# Patient Record
Sex: Female | Born: 1935 | Race: White | Hispanic: No | Smoking: Former smoker
Health system: Southern US, Community
[De-identification: ages and names within clinical notes are randomized; demographics above are authoritative.]

## PROBLEM LIST (undated history)

## (undated) DIAGNOSIS — H9190 Unspecified hearing loss, unspecified ear: Secondary | ICD-10-CM

## (undated) DIAGNOSIS — I739 Peripheral vascular disease, unspecified: Secondary | ICD-10-CM

## (undated) HISTORY — DX: Peripheral vascular disease, unspecified: I73.9

## (undated) HISTORY — DX: Unspecified hearing loss, unspecified ear: H91.90

## (undated) HISTORY — PX: ABDOMINAL HYSTERECTOMY: SHX81

---

## 1959-04-02 HISTORY — PX: CHOLECYSTECTOMY OPEN: SUR202

## 2001-04-01 HISTORY — PX: IR ERCP EXCH REM STENTS BIL PAN DUCT INC DIL EA STENT EXCH: IMG5453

## 2005-04-01 HISTORY — PX: CATARACT EXTRACTION, BILATERAL: SHX1313

## 2012-04-01 HISTORY — PX: ORIF FEMUR FRACTURE: SHX2119

## 2018-02-25 ENCOUNTER — Inpatient Hospital Stay: Payer: Medicare Other

## 2018-02-25 ENCOUNTER — Emergency Department: Payer: Medicare Other

## 2018-02-25 ENCOUNTER — Other Ambulatory Visit: Payer: Self-pay

## 2018-02-25 ENCOUNTER — Inpatient Hospital Stay
Admission: EM | Admit: 2018-02-25 | Discharge: 2018-03-01 | DRG: 871 | Disposition: A | Discharge status: Home Health | Payer: Medicare Other | Attending: Specialist | Admitting: Specialist

## 2018-02-25 DIAGNOSIS — I5031 Acute diastolic (congestive) heart failure: Secondary | ICD-10-CM | POA: Diagnosis present

## 2018-02-25 DIAGNOSIS — W458XXA Other foreign body or object entering through skin, initial encounter: Secondary | ICD-10-CM | POA: Diagnosis not present

## 2018-02-25 DIAGNOSIS — R0602 Shortness of breath: Secondary | ICD-10-CM | POA: Diagnosis not present

## 2018-02-25 DIAGNOSIS — Z95820 Peripheral vascular angioplasty status with implants and grafts: Secondary | ICD-10-CM | POA: Diagnosis not present

## 2018-02-25 DIAGNOSIS — R7881 Bacteremia: Secondary | ICD-10-CM | POA: Diagnosis not present

## 2018-02-25 DIAGNOSIS — B951 Streptococcus, group B, as the cause of diseases classified elsewhere: Secondary | ICD-10-CM | POA: Diagnosis not present

## 2018-02-25 DIAGNOSIS — A401 Sepsis due to streptococcus, group B: Principal | ICD-10-CM | POA: Diagnosis present

## 2018-02-25 DIAGNOSIS — Z9049 Acquired absence of other specified parts of digestive tract: Secondary | ICD-10-CM | POA: Diagnosis not present

## 2018-02-25 DIAGNOSIS — N179 Acute kidney failure, unspecified: Secondary | ICD-10-CM | POA: Diagnosis present

## 2018-02-25 DIAGNOSIS — A419 Sepsis, unspecified organism: Secondary | ICD-10-CM | POA: Diagnosis present

## 2018-02-25 DIAGNOSIS — G9340 Encephalopathy, unspecified: Secondary | ICD-10-CM | POA: Diagnosis present

## 2018-02-25 DIAGNOSIS — Z7902 Long term (current) use of antithrombotics/antiplatelets: Secondary | ICD-10-CM | POA: Diagnosis not present

## 2018-02-25 DIAGNOSIS — K831 Obstruction of bile duct: Secondary | ICD-10-CM

## 2018-02-25 DIAGNOSIS — I739 Peripheral vascular disease, unspecified: Secondary | ICD-10-CM | POA: Diagnosis not present

## 2018-02-25 DIAGNOSIS — Z87891 Personal history of nicotine dependence: Secondary | ICD-10-CM | POA: Diagnosis not present

## 2018-02-25 DIAGNOSIS — K838 Other specified diseases of biliary tract: Secondary | ICD-10-CM | POA: Diagnosis present

## 2018-02-25 DIAGNOSIS — S91104A Unspecified open wound of right lesser toe(s) without damage to nail, initial encounter: Secondary | ICD-10-CM | POA: Diagnosis not present

## 2018-02-25 DIAGNOSIS — Z7982 Long term (current) use of aspirin: Secondary | ICD-10-CM

## 2018-02-25 DIAGNOSIS — I248 Other forms of acute ischemic heart disease: Secondary | ICD-10-CM | POA: Diagnosis present

## 2018-02-25 DIAGNOSIS — R05 Cough: Secondary | ICD-10-CM

## 2018-02-25 DIAGNOSIS — E8770 Fluid overload, unspecified: Secondary | ICD-10-CM | POA: Diagnosis not present

## 2018-02-25 DIAGNOSIS — R059 Cough, unspecified: Secondary | ICD-10-CM

## 2018-02-25 LAB — CBC WITH DIFFERENTIAL/PLATELET
ABS IMMATURE GRANULOCYTES: 0.12 10*3/uL — AB (ref 0.00–0.07)
ABS IMMATURE GRANULOCYTES: 0.83 10*3/uL — AB (ref 0.00–0.07)
Basophils Absolute: 0.1 10*3/uL (ref 0.0–0.1)
Basophils Absolute: 0.1 10*3/uL (ref 0.0–0.1)
Basophils Relative: 0 %
Basophils Relative: 0 %
EOS ABS: 0.1 10*3/uL (ref 0.0–0.5)
EOS PCT: 0 %
Eosinophils Absolute: 0 10*3/uL (ref 0.0–0.5)
Eosinophils Relative: 0 %
HCT: 43.1 % (ref 36.0–46.0)
HEMATOCRIT: 38.4 % (ref 36.0–46.0)
HEMOGLOBIN: 14.3 g/dL (ref 12.0–15.0)
HEMOGLOBIN: 12.7 g/dL (ref 12.0–15.0)
IMMATURE GRANULOCYTES: 1 %
IMMATURE GRANULOCYTES: 3 %
LYMPHS ABS: 0.8 10*3/uL (ref 0.7–4.0)
LYMPHS PCT: 10 %
Lymphocytes Relative: 3 %
Lymphs Abs: 1.9 10*3/uL (ref 0.7–4.0)
MCH: 30.2 pg (ref 26.0–34.0)
MCH: 30.4 pg (ref 26.0–34.0)
MCHC: 33.2 g/dL (ref 30.0–36.0)
MCHC: 33.1 g/dL (ref 30.0–36.0)
MCV: 91.1 fL (ref 80.0–100.0)
MCV: 91.9 fL (ref 80.0–100.0)
MONO ABS: 0.5 10*3/uL (ref 0.1–1.0)
MONO ABS: 2 10*3/uL — AB (ref 0.1–1.0)
MONOS PCT: 8 %
Monocytes Relative: 3 %
NEUTROS ABS: 16.5 10*3/uL — AB (ref 1.7–7.7)
NEUTROS PCT: 86 %
Neutro Abs: 22.9 10*3/uL — ABNORMAL HIGH (ref 1.7–7.7)
Neutrophils Relative %: 86 %
Platelets: 350 10*3/uL (ref 150–400)
Platelets: 296 10*3/uL (ref 150–400)
RBC: 4.73 MIL/uL (ref 3.87–5.11)
RBC: 4.18 MIL/uL (ref 3.87–5.11)
RDW: 14 % (ref 11.5–15.5)
RDW: 14.2 % (ref 11.5–15.5)
Smear Review: NORMAL
WBC: 19.1 10*3/uL — AB (ref 4.0–10.5)
WBC: 26.7 10*3/uL — ABNORMAL HIGH (ref 4.0–10.5)
nRBC: 0 % (ref 0.0–0.2)
nRBC: 0 % (ref 0.0–0.2)

## 2018-02-25 LAB — BLOOD CULTURE ID PANEL (REFLEXED)
ACINETOBACTER BAUMANNII: NOT DETECTED
CANDIDA ALBICANS: NOT DETECTED
Candida glabrata: NOT DETECTED
Candida krusei: NOT DETECTED
Candida parapsilosis: NOT DETECTED
Candida tropicalis: NOT DETECTED
ENTEROBACTER CLOACAE COMPLEX: NOT DETECTED
ENTEROBACTERIACEAE SPECIES: NOT DETECTED
Enterococcus species: NOT DETECTED
Escherichia coli: NOT DETECTED
Haemophilus influenzae: NOT DETECTED
Klebsiella oxytoca: NOT DETECTED
Klebsiella pneumoniae: NOT DETECTED
Listeria monocytogenes: NOT DETECTED
NEISSERIA MENINGITIDIS: NOT DETECTED
PSEUDOMONAS AERUGINOSA: NOT DETECTED
Proteus species: NOT DETECTED
STREPTOCOCCUS AGALACTIAE: DETECTED — AB
STREPTOCOCCUS PNEUMONIAE: NOT DETECTED
STREPTOCOCCUS PYOGENES: NOT DETECTED
Serratia marcescens: NOT DETECTED
Staphylococcus aureus (BCID): NOT DETECTED
Staphylococcus species: NOT DETECTED
Streptococcus species: DETECTED — AB

## 2018-02-25 LAB — URINALYSIS, COMPLETE (UACMP) WITH MICROSCOPIC
BILIRUBIN URINE: NEGATIVE
Glucose, UA: NEGATIVE mg/dL
Ketones, ur: NEGATIVE mg/dL
LEUKOCYTES UA: NEGATIVE
NITRITE: NEGATIVE
PH: 5 (ref 5.0–8.0)
Protein, ur: 100 mg/dL — AB
SPECIFIC GRAVITY, URINE: 1.013 (ref 1.005–1.030)

## 2018-02-25 LAB — PROTIME-INR
INR: 1.02
INR: 1.12
PROTHROMBIN TIME: 13.3 s (ref 11.4–15.2)
Prothrombin Time: 14.3 seconds (ref 11.4–15.2)

## 2018-02-25 LAB — COMPREHENSIVE METABOLIC PANEL
ALK PHOS: 58 U/L (ref 38–126)
ALT: 15 U/L (ref 0–44)
ALT: 16 U/L (ref 0–44)
AST: 26 U/L (ref 15–41)
AST: 26 U/L (ref 15–41)
Albumin: 4.5 g/dL (ref 3.5–5.0)
Albumin: 3.5 g/dL (ref 3.5–5.0)
Alkaline Phosphatase: 72 U/L (ref 38–126)
Anion gap: 14 (ref 5–15)
Anion gap: 9 (ref 5–15)
BILIRUBIN TOTAL: 1.4 mg/dL — AB (ref 0.3–1.2)
BUN: 18 mg/dL (ref 8–23)
BUN: 20 mg/dL (ref 8–23)
CHLORIDE: 102 mmol/L (ref 98–111)
CO2: 22 mmol/L (ref 22–32)
CO2: 23 mmol/L (ref 22–32)
CREATININE: 1.09 mg/dL — AB (ref 0.44–1.00)
Calcium: 9.4 mg/dL (ref 8.9–10.3)
Calcium: 8.4 mg/dL — ABNORMAL LOW (ref 8.9–10.3)
Chloride: 104 mmol/L (ref 98–111)
Creatinine, Ser: 1 mg/dL (ref 0.44–1.00)
GFR calc Af Amer: 58 mL/min — ABNORMAL LOW (ref >60)
GFR calc Af Amer: 52 mL/min — ABNORMAL LOW (ref >60)
GFR, EST NON AFRICAN AMERICAN: 50 mL/min — AB (ref >60)
GFR, EST NON AFRICAN AMERICAN: 45 mL/min — AB (ref >60)
Glucose, Bld: 125 mg/dL — ABNORMAL HIGH (ref 70–99)
Glucose, Bld: 121 mg/dL — ABNORMAL HIGH (ref 70–99)
POTASSIUM: 3.9 mmol/L (ref 3.5–5.1)
Potassium: 3 mmol/L — ABNORMAL LOW (ref 3.5–5.1)
Sodium: 138 mmol/L (ref 135–145)
Sodium: 136 mmol/L (ref 135–145)
Total Bilirubin: 1.1 mg/dL (ref 0.3–1.2)
Total Protein: 8 g/dL (ref 6.5–8.1)
Total Protein: 6.4 g/dL — ABNORMAL LOW (ref 6.5–8.1)

## 2018-02-25 LAB — BLOOD GAS, VENOUS
ACID-BASE DEFICIT: 0.6 mmol/L (ref 0.0–2.0)
BICARBONATE: 25.4 mmol/L (ref 20.0–28.0)
O2 SAT: 38.9 %
PCO2 VEN: 46 mmHg (ref 44.0–60.0)
Patient temperature: 37
pH, Ven: 7.35 (ref 7.250–7.430)
pO2, Ven: <31 mmHg — CL (ref 32.0–45.0)

## 2018-02-25 LAB — GASTROINTESTINAL PANEL BY PCR, STOOL (REPLACES STOOL CULTURE)
Adenovirus F40/41: NOT DETECTED
Astrovirus: NOT DETECTED
CAMPYLOBACTER SPECIES: NOT DETECTED
Cryptosporidium: NOT DETECTED
Cyclospora cayetanensis: NOT DETECTED
ENTEROPATHOGENIC E COLI (EPEC): NOT DETECTED
Entamoeba histolytica: NOT DETECTED
Enteroaggregative E coli (EAEC): NOT DETECTED
Enterotoxigenic E coli (ETEC): NOT DETECTED
Giardia lamblia: NOT DETECTED
NOROVIRUS GI/GII: NOT DETECTED
PLESIMONAS SHIGELLOIDES: NOT DETECTED
Rotavirus A: NOT DETECTED
SAPOVIRUS (I, II, IV, AND V): NOT DETECTED
SHIGA LIKE TOXIN PRODUCING E COLI (STEC): NOT DETECTED
Salmonella species: NOT DETECTED
Shigella/Enteroinvasive E coli (EIEC): NOT DETECTED
Vibrio cholerae: NOT DETECTED
Vibrio species: NOT DETECTED
Yersinia enterocolitica: NOT DETECTED

## 2018-02-25 LAB — INFLUENZA PANEL BY PCR (TYPE A & B)
INFLAPCR: NEGATIVE
INFLBPCR: NEGATIVE

## 2018-02-25 LAB — CG4 I-STAT (LACTIC ACID)
Lactic Acid, Venous: 4.49 mmol/L (ref 0.5–1.9)
Lactic Acid, Venous: 2.91 mmol/L (ref 0.5–1.9)

## 2018-02-25 LAB — C DIFFICILE QUICK SCREEN W PCR REFLEX
C DIFFICILE (CDIFF) INTERP: NOT DETECTED
C DIFFICLE (CDIFF) ANTIGEN: NEGATIVE
C Diff toxin: NEGATIVE

## 2018-02-25 LAB — LACTIC ACID, PLASMA
Lactic Acid, Venous: 2.4 mmol/L (ref 0.5–1.9)
Lactic Acid, Venous: 2.4 mmol/L (ref 0.5–1.9)

## 2018-02-25 LAB — AMMONIA: AMMONIA: 11 umol/L (ref 9–35)

## 2018-02-25 LAB — BRAIN NATRIURETIC PEPTIDE: B Natriuretic Peptide: 60 pg/mL (ref 0.0–100.0)

## 2018-02-25 LAB — APTT: APTT: 42 s — AB (ref 24–36)

## 2018-02-25 LAB — PROCALCITONIN: Procalcitonin: 26.01 ng/mL

## 2018-02-25 LAB — TROPONIN I: Troponin I: <0.03 ng/mL (ref <0.03)

## 2018-02-25 MED ORDER — SODIUM CHLORIDE 0.9 % IV SOLN
2.0000 g | Freq: Two times a day (BID) | INTRAVENOUS | Status: DC
  Filled 2018-02-25: qty 2

## 2018-02-25 MED ORDER — VANCOMYCIN HCL IN DEXTROSE 1-5 GM/200ML-% IV SOLN
1000.0000 mg | INTRAVENOUS | Status: DC
  Administered 2018-02-26: 1000 mg via INTRAVENOUS
  Filled 2018-02-25 (×2): qty 200

## 2018-02-25 MED ORDER — SODIUM CHLORIDE 0.9 % IV BOLUS
500.0000 mL | Freq: Once | INTRAVENOUS | Status: AC
  Administered 2018-02-25: 500 mL via INTRAVENOUS

## 2018-02-25 MED ORDER — SODIUM CHLORIDE 0.9 % IV SOLN: INTRAVENOUS | Status: DC

## 2018-02-25 MED ORDER — ENOXAPARIN SODIUM 40 MG/0.4ML ~~LOC~~ SOLN
40.0000 mg | SUBCUTANEOUS | Status: DC
  Administered 2018-02-25 – 2018-02-28 (×4): 40 mg via SUBCUTANEOUS
  Filled 2018-02-25 (×4): qty 0.4

## 2018-02-25 MED ORDER — METRONIDAZOLE IN NACL 5-0.79 MG/ML-% IV SOLN
500.0000 mg | Freq: Three times a day (TID) | INTRAVENOUS | Status: DC
  Administered 2018-02-25 – 2018-02-26 (×3): 500 mg via INTRAVENOUS
  Filled 2018-02-25 (×5): qty 100

## 2018-02-25 MED ORDER — LORAZEPAM 2 MG/ML IJ SOLN
1.0000 mg | Freq: Once | INTRAMUSCULAR | Status: AC
  Administered 2018-02-25: 22:00:00 1 mg via INTRAVENOUS
  Filled 2018-02-25: qty 1

## 2018-02-25 MED ORDER — VANCOMYCIN HCL 10 G IV SOLR
1000.0000 mg | Freq: Once | INTRAVENOUS | Status: DC
  Filled 2018-02-25: qty 1000

## 2018-02-25 MED ORDER — ACETAMINOPHEN 325 MG PO TABS: 650.0000 mg | ORAL_TABLET | Freq: Four times a day (QID) | ORAL | Status: DC | PRN

## 2018-02-25 MED ORDER — SODIUM CHLORIDE 0.9 % IV BOLUS
1000.0000 mL | Freq: Once | INTRAVENOUS | Status: AC
  Administered 2018-02-25: 1000 mL via INTRAVENOUS

## 2018-02-25 MED ORDER — GADOBUTROL 1 MMOL/ML IV SOLN
8.5000 mL | Freq: Once | INTRAVENOUS | Status: AC | PRN
  Administered 2018-02-25: 8.5 mL via INTRAVENOUS

## 2018-02-25 MED ORDER — SODIUM CHLORIDE 0.9 % IV SOLN
2.0000 g | Freq: Once | INTRAVENOUS | Status: DC
  Filled 2018-02-25: qty 2

## 2018-02-25 MED ORDER — SODIUM CHLORIDE 0.9 % IV SOLN
INTRAVENOUS | Status: DC
  Administered 2018-02-25: 17:00:00 via INTRAVENOUS

## 2018-02-25 MED ORDER — ONDANSETRON HCL 4 MG PO TABS: 4.0000 mg | ORAL_TABLET | Freq: Four times a day (QID) | ORAL | Status: DC | PRN

## 2018-02-25 MED ORDER — ONDANSETRON HCL 4 MG/2ML IJ SOLN
4.0000 mg | Freq: Four times a day (QID) | INTRAMUSCULAR | Status: DC | PRN
  Administered 2018-02-28: 17:00:00 4 mg via INTRAVENOUS
  Filled 2018-02-25: qty 2

## 2018-02-25 MED ORDER — SODIUM CHLORIDE 0.9 % IV SOLN: 2.0000 g | Freq: Two times a day (BID) | INTRAVENOUS | Status: DC

## 2018-02-25 MED ORDER — VANCOMYCIN HCL IN DEXTROSE 1-5 GM/200ML-% IV SOLN
1000.0000 mg | Freq: Once | INTRAVENOUS | Status: AC
  Administered 2018-02-25: 1000 mg via INTRAVENOUS
  Filled 2018-02-25: qty 200

## 2018-02-25 MED ORDER — PIPERACILLIN-TAZOBACTAM 3.375 G IVPB 30 MIN
3.3750 g | Freq: Once | INTRAVENOUS | Status: AC
  Administered 2018-02-25: 3.375 g via INTRAVENOUS
  Filled 2018-02-25: qty 50

## 2018-02-25 MED ORDER — POTASSIUM CHLORIDE 20 MEQ PO PACK
40.0000 meq | PACK | Freq: Once | ORAL | Status: AC
  Administered 2018-02-25: 40 meq via ORAL
  Filled 2018-02-25: qty 2

## 2018-02-25 MED ORDER — ACETAMINOPHEN 650 MG RE SUPP: 650.0000 mg | Freq: Four times a day (QID) | RECTAL | Status: DC | PRN

## 2018-02-25 MED ORDER — TRAZODONE HCL 50 MG PO TABS
50.0000 mg | ORAL_TABLET | Freq: Every evening | ORAL | Status: DC | PRN
  Administered 2018-02-26 – 2018-02-28 (×2): 50 mg via ORAL
  Filled 2018-02-25 (×3): qty 1

## 2018-02-25 MED ORDER — VANCOMYCIN HCL IN DEXTROSE 1-5 GM/200ML-% IV SOLN
1000.0000 mg | Freq: Once | INTRAVENOUS | Status: DC
  Filled 2018-02-25: qty 200

## 2018-02-25 MED ORDER — POTASSIUM CHLORIDE IN NACL 20-0.9 MEQ/L-% IV SOLN
INTRAVENOUS | Status: DC
  Administered 2018-02-25 – 2018-02-27 (×5): via INTRAVENOUS
  Filled 2018-02-25 (×7): qty 1000

## 2018-02-25 NOTE — H&P (Signed)
[facility] [place] [facility]   PATIENT NAME: Kristen Dudley    MR#:  [id number]  DATE OF BIRTH:  [birth year]  DATE OF ADMISSION:  02/25/2018  PRIMARY CARE PHYSICIAN: Patient, No Pcp Per   REQUESTING/REFERRING PHYSICIAN:   CHIEF COMPLAINT:   Chief Complaint  Patient presents with  . Emesis  . Abdominal Pain    HISTORY OF PRESENT ILLNESS: Kristen Dudley  is a 84 y.o. female with a known history per below recently [place] the [place] to live with son, patient is a somewhat poor historian/difficult interview, per ED attending presenting with nausea/vomiting/diarrhea, generalized weakness, fatigue, going on for approximately 2 days, was brought to the emergency room by son-patient had refused being brought to emergency room previously, had also complaints of low back pain, noted acute sepsis on work-up, febrile, tachycardic, tachypneic, lactic acid 4.4, white count of 19,000, C. difficile was negative, urinalysis negative, CT abdomen noted for common bile duct dilatation/AAA 3.8 cm, GI panel pending, patient evaluated in the emergency room, family currently not available, patient able to answer some questions but does not give great detail/history, patient is now been admitted for acute sepsis, diarrhea, acute encephalopathy secondary to unknown etiology.  PAST MEDICAL HISTORY:   Unable to be obtained given poor historian  PAST SURGICAL HISTORY:  Past Surgical History:  Procedure Laterality Date  . IR ERCP EXCH REM STENTS BIL PAN DUCT INC DIL EA STENT EXCH     leg stents    SOCIAL HISTORY:  Social History   Tobacco Use  . Smoking status: Former Games developer  . Smokeless tobacco: Never Used  Substance Use Topics  . Alcohol use: Never    Frequency: Never    FAMILY HISTORY:  Unable to be obtained given poor historian  DRUG ALLERGIES:  Allergies  Allergen Reactions  . Iohexol     "makes me pass out"    REVIEW OF SYSTEMS: Poor  historian  CONSTITUTIONAL: + fever, fatigue, weakness.  EYES: No blurred or double vision.  EARS, NOSE, AND THROAT: No tinnitus or ear pain.  RESPIRATORY: No cough, shortness of breath, wheezing or hemoptysis.  CARDIOVASCULAR: No chest pain, orthopnea, edema.  GASTROINTESTINAL: No nausea, vomiting, diarrhea or abdominal pain.  GENITOURINARY: No dysuria, hematuria.  ENDOCRINE: No polyuria, nocturia,  HEMATOLOGY: No anemia, easy bruising or bleeding SKIN: No rash or lesion. MUSCULOSKELETAL: No joint pain or arthritis.   NEUROLOGIC: No tingling, numbness, weakness.  PSYCHIATRY: No anxiety or depression.   MEDICATIONS AT HOME:  Prior to Admission medications   Not on File      PHYSICAL EXAMINATION:   VITAL SIGNS: Blood pressure (!) 114/48, pulse (!) 111, temperature 98.8 F (37.1 C), temperature source Oral, resp. rate (!) 33, height 5\' 3"  (1.6 m), weight 87.5 kg, SpO2 96 %.  GENERAL:  84 y.o.-year-old patient lying in the bed with no acute distress.  Morbidly obese EYES: Pupils equal, round, reactive to light and accommodation. No scleral icterus. Extraocular muscles intact.  HEENT: Head atraumatic, normocephalic. Oropharynx and nasopharynx clear.  Dry mucous membranes NECK:  Supple, no jugular venous distention. No thyroid enlargement, no tenderness.  Poor skin turgor LUNGS: Normal breath sounds bilaterally, no wheezing, rales,rhonchi or crepitation. No use of accessory muscles of respiration.  CARDIOVASCULAR: S1, S2 normal. No murmurs, rubs, or gallops.  ABDOMEN: Soft, nontender, nondistended. Bowel sounds present. No organomegaly or mass.  EXTREMITIES: No pedal edema, cyanosis, or clubbing.  NEUROLOGIC: Cranial nerves II through XII are  intact. MAES no focal deficits  PSYCHIATRIC: The patient is lethargic, will open eyes and answer questions intermittently  Skin: No obvious rash, lesion, or ulcer.   LABORATORY PANEL:   CBC Recent Labs  Lab 02/25/18 1059 02/25/18 1552   WBC 19.1* 26.7*  HGB 14.3 12.7  HCT 43.1 38.4  PLT 350 296  MCV 91.1 91.9  MCH 30.2 30.4  MCHC 33.2 33.1  RDW 14.0 14.2  LYMPHSABS 1.9 0.8  MONOABS 0.5 2.0*  EOSABS 0.0 0.1  BASOSABS 0.1 0.1   ------------------------------------------------------------------------------------------------------------------  Chemistries  Recent Labs  Lab 02/25/18 1059 02/25/18 1552  NA 138 136  K 3.9 3.0*  CL 102 104  CO2 22 23  GLUCOSE 125* 121*  BUN 18 20  CREATININE 1.00 1.09*  CALCIUM 9.4 8.4*  AST 26 26  ALT 15 16  ALKPHOS 72 58  BILITOT 1.1 1.4*   ------------------------------------------------------------------------------------------------------------------ estimated creatinine clearance is 37.4 mL/min (A) (by C-G formula based on SCr of 1.09 mg/dL (H)). ------------------------------------------------------------------------------------------------------------------ No results for input(s): TSH, T4TOTAL, T3FREE, THYROIDAB in the last 72 hours.  Invalid input(s): FREET3   Coagulation profile Recent Labs  Lab 02/25/18 1059 02/25/18 1552  INR 1.02 1.12   ------------------------------------------------------------------------------------------------------------------- No results for input(s): DDIMER in the last 72 hours. -------------------------------------------------------------------------------------------------------------------  Cardiac Enzymes Recent Labs  Lab 02/25/18 1059  TROPONINI <0.03   ------------------------------------------------------------------------------------------------------------------ Invalid input(s): POCBNP  ---------------------------------------------------------------------------------------------------------------  Urinalysis    Component Value Date/Time   COLORURINE YELLOW (A) 02/25/2018 1059   APPEARANCEUR HAZY (A) 02/25/2018 1059   LABSPEC 1.013 02/25/2018 1059   PHURINE 5.0 02/25/2018 1059   GLUCOSEU NEGATIVE  02/25/2018 1059   HGBUR SMALL (A) 02/25/2018 1059   BILIRUBINUR NEGATIVE 02/25/2018 1059   KETONESUR NEGATIVE 02/25/2018 1059   PROTEINUR 100 (A) 02/25/2018 1059   NITRITE NEGATIVE 02/25/2018 1059   LEUKOCYTESUR NEGATIVE 02/25/2018 1059     RADIOLOGY: Ct Abdomen Pelvis Wo Contrast  Result Date: 02/25/2018 CLINICAL DATA:  Nausea. Left-sided pain and low back pain. Vomiting. Sepsis. EXAM: CT ABDOMEN AND PELVIS WITHOUT CONTRAST TECHNIQUE: Multidetector CT imaging of the abdomen and pelvis was performed following the standard protocol without IV contrast. COMPARISON:  None. FINDINGS: Lower chest: No acute abnormality. Hepatobiliary: The liver parenchyma appears normal except for a few calcified granulomas. There is dilatation of the common hepatic and common bile duct to a diameter of 16 mm. No discrete stone or mass. Pancreas: Unremarkable. No pancreatic ductal dilatation or surrounding inflammatory changes. Spleen: Numerous calcified granulomas in the spleen. Adrenals/Urinary Tract: Adrenal glands are unremarkable. Kidneys are normal, without renal calculi, focal lesion, or hydronephrosis. Bladder is unremarkable. Stomach/Bowel: Stomach is within normal limits. Appendix is not visualized. No evidence of bowel wall thickening, distention, or inflammatory changes. Multiple diverticula in the left side of the colon. No discrete diverticulitis. Vascular/Lymphatic: There is focal dilatation of the abdominal aorta to a maximum diameter of 3.8 cm. Extensive aortic atherosclerosis. Adenopathy. Reproductive: Status post hysterectomy. No adnexal masses. Other: No abdominal wall hernia or abnormality. No abdominopelvic ascites. Musculoskeletal: No acute or significant osseous findings. IMPRESSION: 1. Dilatation of the common hepatic and common bile duct, greater than would be expected after cholecystectomy. Surgical history in the medical record indicates prior bile duct stents. There is only slight intrahepatic  ductal dilatation. 2. Extensive aortic atherosclerosis with aneurysmal dilatation of the abdominal aorta to a diameter of 3.8 cm. Recommend followup by ultrasound in 2 years. This recommendation follows ACR consensus guidelines: White Paper of the ACR Incidental Findings Committee  II on Vascular Findings. J Am Coll Radiol 2013; 10:789-794. 3. No acute abnormalities. Electronically Signed   By: Francene Boyers M.D.   On: 02/25/2018 12:38   Dg Chest 2 View  Result Date: 02/25/2018 CLINICAL DATA:  Nausea.  Left-sided pain and low back pain. EXAM: CHEST - 2 VIEW COMPARISON:  None. FINDINGS: Heart size and pulmonary vascularity are normal. Aortic atherosclerosis. No infiltrates or effusions. No acute bone abnormality. Calcified lymph node in the inferior aspect of the right hilum. Calcified granuloma at the right lung base. Calcified lymph node in the azygos region. IMPRESSION: No acute abnormalities. Aortic Atherosclerosis (ICD10-I70.0). Electronically Signed   By: Francene Boyers M.D.   On: 02/25/2018 12:28   Ct Head Wo Contrast  Result Date: 02/25/2018 CLINICAL DATA:  84 y/o  F; altered mental status. EXAM: CT HEAD WITHOUT CONTRAST TECHNIQUE: Contiguous axial images were obtained from the base of the skull through the vertex without intravenous contrast. COMPARISON:  None. FINDINGS: Brain: No evidence of acute infarction, hemorrhage, hydrocephalus, extra-axial collection or mass lesion/mass effect. 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 Nonspecific white matter hypodensities are compatible with mild chronic microvascular ischemic changes for age and there is mild volume loss of the brain. Vascular: Calcific atherosclerosis of the carotid siphons. No hyperdense vessel identified. Skull: Normal. Negative for fracture or focal lesion. Sinuses/Orbits: No acute finding. Other: None. IMPRESSION: 1. No acute intracranial abnormality identified. 2. Mild chronic  microvascular ischemic changes and mild volume loss of the brain for age. 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 Electronically Signed   By: Mitzi Hansen M.D.   On: 02/25/2018 15:04   Dg Chest Port 1 View  Result Date: 02/25/2018 CLINICAL DATA:  Sepsis. EXAM: PORTABLE CHEST 1 VIEW COMPARISON:  Radiographs of February 25, 2018. FINDINGS: Stable cardiomediastinal silhouette. Atherosclerosis of thoracic aorta is noted. No pneumothorax pleural effusion is noted. Left lung is unremarkable. Stable calcified granuloma seen in right lower lobe. Mildly increased right basilar opacity is noted which may represent atelectasis or infiltrate. Bony thorax is unremarkable. IMPRESSION: Mildly increased right basilar densities are noted which may represent atelectasis or possibly infiltrate. Aortic Atherosclerosis (ICD10-I70.0). Electronically Signed   By: Lupita Raider, M.D.   On: 02/25/2018 16:10    EKG: Orders placed or performed during the hospital encounter of 02/25/18  . ED EKG  . ED EKG  . EKG 12-Lead  . EKG 12-Lead    IMPRESSION AND PLAN: *Acute sepsis secondary to unknown etiology *Acute nausea/vomiting/diarrhea *Acute encephalopathy *Acute abnormal CT of the abdomen noted for common bile duct dilatation  Admit to regular nursing for bed on our sepsis protocol, empiric cefepime/Flagyl/vancomycin, follow-up on cultures, IV fluids for rehydration, follow-up on GI panel, Lactinex 3 times daily, aspiration/fall/skin care precautions while in house, check urine drug screen, check RPR, check MRCP given common bile duct dilatation, neurochecks per routine, continue close medical monitoring   All the records are reviewed and case discussed with ED provider. Management plans discussed with the patient, family and they are in agreement.  CODE STATUS:full    Code Status Orders  (From admission, onward)         Start     Ordered   02/25/18 1547   Full code  Continuous     02/25/18 1546        Code Status History    This patient has a current code status but no historical code  status.    Advance Directive Documentation     Most Recent Value  Type of Advance Directive  Healthcare Power of Attorney  Pre-existing out of facility DNR order (yellow form or pink MOST form)  -  "MOST" Form in Place?  -       TOTAL TIME TAKING CARE OF THIS PATIENT: 45 minutes.    Evelena AsaMontell D Salary M.D on 02/25/2018   Between 7am to 6pm - Pager - [phone]  After 6pm go to [email or web address] - password [email or web address]  [facility]  Office  [phone]  CC: Primary care physician; Patient, No Pcp Per   Note: This dictation was prepared with Dragon dictation along with smaller phrase technology. Any transcriptional errors that result from this process are unintentional.

## 2018-02-25 NOTE — Plan of Care (Signed)
Pt admitted from the ED.  Afebrile. Soft BP at 99/48. Now on RA with O2 sats in the mid 90's. ABX and IVF infusing. Pt a&Ox3, easily reoriented.  Poor historian, forgetful.  C/o mild L back pain. Denies nausea.

## 2018-02-25 NOTE — ED Triage Notes (Signed)
Pt nauseas for approx 1 hour w/ complaint of left side and lower back pain. Sudden onset. Pt vomit x1 since being in room. Usually continent, had bowel mvmt

## 2018-02-25 NOTE — ED Notes (Signed)
ED TO INPATIENT HANDOFF REPORT  Name/Age/Gender Kristen Dudley 84 y.o. female  Code Status Advance Directive Documentation     Most Recent Value  Type of Advance Directive  Healthcare Power of Attorney  Pre-existing out of facility DNR order (yellow form or pink MOST form)  -  "MOST" Form in Place?  -      Home/SNF/Other Home  Chief Complaint shaking chills lt hip pain  Level of Care/Admitting Diagnosis ED Disposition    ED Disposition Condition Comment   Admit  Hospital Area: [facility] [[id number]]  Level of Care: Med-Surg [16]  Diagnosis: Sepsis [facility]) [[id number]]  Admitting Physician: Bertrum Sol [[id number]]  Attending Physician: Bertrum Sol [[id number]]  Estimated length of stay: past midnight tomorrow  Certification:: I certify this patient will need inpatient services for at least 2 midnights  PT Class (Do Not Modify): Inpatient [101]  PT Acc Code (Do Not Modify): Private [1]       Medical History History reviewed. No pertinent past medical history.  Allergies Allergies  Allergen Reactions  . Iohexol     "makes me pass out"    IV Location/Drains/Wounds Patient Lines/Drains/Airways Status   Active Line/Drains/Airways    Name:   Placement date:   Placement time:   Site:   Days:   Peripheral IV 02/25/18 Left Forearm   02/25/18    1057    Forearm   less than 1   Peripheral IV 02/25/18 Right Forearm   02/25/18    1057    Forearm   less than 1          Labs/Imaging Results for orders placed or performed during the hospital encounter of 02/25/18 (from the past 48 hour(s))  Comprehensive metabolic panel     Status: Abnormal   Collection Time: 02/25/18 10:59 AM  Result Value Ref Range   Sodium 138 135 - 145 mmol/L   Potassium 3.9 3.5 - 5.1 mmol/L   Chloride 102 98 - 111 mmol/L   CO2 22 22 - 32 mmol/L   Glucose, Bld 125 (H) 70 - 99 mg/dL   BUN 18 8 - 23 mg/dL   Creatinine, Ser 9.56 0.44 - 1.00 mg/dL   Calcium 9.4 8.9 - 21.3  mg/dL   Total Protein 8.0 6.5 - 8.1 g/dL   Albumin 4.5 3.5 - 5.0 g/dL   AST 26 15 - 41 U/L   ALT 15 0 - 44 U/L   Alkaline Phosphatase 72 38 - 126 U/L   Total Bilirubin 1.1 0.3 - 1.2 mg/dL   GFR calc non Af Amer 50 (L) >60 mL/min   GFR calc Af Amer 58 (L) >60 mL/min   Anion gap 14 5 - 15    Comment: Performed at [facility], [address] [place], [place] [address]  CBC with Differential     Status: Abnormal   Collection Time: 02/25/18 10:59 AM  Result Value Ref Range   WBC 19.1 (H) 4.0 - 10.5 K/uL   RBC 4.73 3.87 - 5.11 MIL/uL   Hemoglobin 14.3 12.0 - 15.0 g/dL   HCT 84.6 96.2 - 95.2 %   MCV 91.1 80.0 - 100.0 fL   MCH 30.2 26.0 - 34.0 pg   MCHC 33.2 30.0 - 36.0 g/dL   RDW 84.1 32.4 - 40.1 %   Platelets 350 150 - 400 K/uL   nRBC 0.0 0.0 - 0.2 %   Neutrophils Relative % 86 %   Neutro Abs 16.5 (H)  1.7 - 7.7 K/uL   Lymphocytes Relative 10 %   Lymphs Abs 1.9 0.7 - 4.0 K/uL   Monocytes Relative 3 %   Monocytes Absolute 0.5 0.1 - 1.0 K/uL   Eosinophils Relative 0 %   Eosinophils Absolute 0.0 0.0 - 0.5 K/uL   Basophils Relative 0 %   Basophils Absolute 0.1 0.0 - 0.1 K/uL   Immature Granulocytes 1 %   Abs Immature Granulocytes 0.12 (H) 0.00 - 0.07 K/uL    Comment: Performed at [facility], [address] [place], [place] [address]  Protime-INR     Status: None   Collection Time: 02/25/18 10:59 AM  Result Value Ref Range   Prothrombin Time 13.3 11.4 - 15.2 seconds   INR 1.02     Comment: Performed at [facility], [address] [place], [place] [address]  Urinalysis, Complete w Microscopic     Status: Abnormal   Collection Time: 02/25/18 10:59 AM  Result Value Ref Range   Color, Urine YELLOW (A) YELLOW   APPearance HAZY (A) CLEAR   Specific Gravity, Urine 1.013 1.005 - 1.030   pH 5.0 5.0 - 8.0   Glucose, UA NEGATIVE NEGATIVE mg/dL   Hgb urine dipstick SMALL (A) NEGATIVE   Bilirubin Urine NEGATIVE NEGATIVE   Ketones, ur NEGATIVE NEGATIVE  mg/dL   Protein, ur 829 (A) NEGATIVE mg/dL   Nitrite NEGATIVE NEGATIVE   Leukocytes, UA NEGATIVE NEGATIVE   RBC / HPF 6-10 0 - 5 RBC/hpf   WBC, UA 11-20 0 - 5 WBC/hpf   Bacteria, UA RARE (A) NONE SEEN   Squamous Epithelial / LPF 0-5 0 - 5   Mucus PRESENT    Amorphous Crystal PRESENT     Comment: Performed at [facility], [address], [place], [place] [address]  Troponin I - Add-On to previous collection     Status: None   Collection Time: 02/25/18 10:59 AM  Result Value Ref Range   Troponin I <0.03 <0.03 ng/mL    Comment: Performed at [facility], [address] [place], [place] [address]  Brain natriuretic peptide     Status: None   Collection Time: 02/25/18 10:59 AM  Result Value Ref Range   B Natriuretic Peptide 60.0 0.0 - 100.0 pg/mL    Comment: Performed at [facility], [address] [place], [place] [address]  C difficile quick scan w PCR reflex     Status: None   Collection Time: 02/25/18 11:00 AM  Result Value Ref Range   C Diff antigen NEGATIVE NEGATIVE   C Diff toxin NEGATIVE NEGATIVE   C Diff interpretation No C. difficile detected.     Comment: Performed at [facility], [address] [place], [place] [address]  CG4 I-STAT (Lactic acid)     Status: Abnormal   Collection Time: 02/25/18 11:04 AM  Result Value Ref Range   Lactic Acid, Venous 4.49 (HH) 0.5 - 1.9 mmol/L   Comment NOTIFIED PHYSICIAN   Influenza panel by PCR (type A & B)     Status: None   Collection Time: 02/25/18  1:08 PM  Result Value Ref Range   Influenza A By PCR NEGATIVE NEGATIVE   Influenza B By PCR NEGATIVE NEGATIVE    Comment: (NOTE) The Xpert Xpress Flu assay is intended as an aid in the diagnosis of  influenza and should not be used as a sole basis for treatment.  This  assay is FDA approved for nasopharyngeal swab  specimens only. Nasal  washings and aspirates are unacceptable for Xpert Xpress Flu testing. Performed at [facility],  [address] [place], [place] [address]   Blood gas, venous     Status: Abnormal   Collection Time: 02/25/18  1:08 PM  Result Value Ref Range   pH, Ven 7.35 7.250 - 7.430   pCO2, Ven 46 44.0 - 60.0 mmHg   pO2, Ven <31.0 (LL) 32.0 - 45.0 mmHg    Comment: VENOUS   Bicarbonate 25.4 20.0 - 28.0 mmol/L   Acid-base deficit 0.6 0.0 - 2.0 mmol/L   O2 Saturation 38.9 %   Patient temperature 37.0    Collection site VEIN    Sample type VENOUS     Comment: Performed at [facility], [address] [place], [place] [address]  CG4 I-STAT (Lactic acid)     Status: Abnormal   Collection Time: 02/25/18  2:44 PM  Result Value Ref Range   Lactic Acid, Venous 2.91 (HH) 0.5 - 1.9 mmol/L   Comment NOTIFIED PHYSICIAN    Ct Abdomen Pelvis Wo Contrast  Result Date: 02/25/2018 CLINICAL DATA:  Nausea. Left-sided pain and low back pain. Vomiting. Sepsis. EXAM: CT ABDOMEN AND PELVIS WITHOUT CONTRAST TECHNIQUE: Multidetector CT imaging of the abdomen and pelvis was performed following the standard protocol without IV contrast. COMPARISON:  None. FINDINGS: Lower chest: No acute abnormality. Hepatobiliary: The liver parenchyma appears normal except for a few calcified granulomas. There is dilatation of the common hepatic and common bile duct to a diameter of 16 mm. No discrete stone or mass. Pancreas: Unremarkable. No pancreatic ductal dilatation or surrounding inflammatory changes. Spleen: Numerous calcified granulomas in the spleen. Adrenals/Urinary Tract: Adrenal glands are unremarkable. Kidneys are normal, without renal calculi, focal lesion, or hydronephrosis. Bladder is unremarkable. Stomach/Bowel: Stomach is within normal limits. Appendix is not visualized. No evidence of bowel wall thickening, distention, or inflammatory changes. Multiple diverticula in the left side of the colon. No discrete diverticulitis. Vascular/Lymphatic: There is focal dilatation of the abdominal aorta to a maximum diameter  of 3.8 cm. Extensive aortic atherosclerosis. Adenopathy. Reproductive: Status post hysterectomy. No adnexal masses. Other: No abdominal wall hernia or abnormality. No abdominopelvic ascites. Musculoskeletal: No acute or significant osseous findings. IMPRESSION: 1. Dilatation of the common hepatic and common bile duct, greater than would be expected after cholecystectomy. Surgical history in the medical record indicates prior bile duct stents. There is only slight intrahepatic ductal dilatation. 2. Extensive aortic atherosclerosis with aneurysmal dilatation of the abdominal aorta to a diameter of 3.8 cm. Recommend followup by ultrasound in 2 years. This recommendation follows ACR consensus guidelines: White Paper of the ACR Incidental Findings Committee II on Vascular Findings. J Am Coll Radiol 2013; 10:789-794. 3. No acute abnormalities. Electronically Signed   By: Francene BoyersJames  Maxwell M.D.   On: 02/25/2018 12:38   Dg Chest 2 View  Result Date: 02/25/2018 CLINICAL DATA:  Nausea.  Left-sided pain and low back pain. EXAM: CHEST - 2 VIEW COMPARISON:  None. FINDINGS: Heart size and pulmonary vascularity are normal. Aortic atherosclerosis. No infiltrates or effusions. No acute bone abnormality. Calcified lymph node in the inferior aspect of the right hilum. Calcified granuloma at the right lung base. Calcified lymph node in the azygos region. IMPRESSION: No acute abnormalities. Aortic Atherosclerosis (ICD10-I70.0). Electronically Signed   By: Francene BoyersJames  Maxwell M.D.   On: 02/25/2018 12:28   Ct Head Wo Contrast  Result Date: 02/25/2018 CLINICAL DATA:  84 y/o  F; altered mental status.  EXAM: CT HEAD WITHOUT CONTRAST TECHNIQUE: Contiguous axial images were obtained from the base of the skull through the vertex without intravenous contrast. COMPARISON:  None. FINDINGS: Brain: No evidence of acute infarction, hemorrhage, hydrocephalus, extra-axial collection or mass lesion/mass effect. Very small chronic infarctions within  the right cerebellar hemisphere and bilateral thalami. Nonspecific white matter hypodensities are compatible with mild chronic microvascular ischemic changes for age and there is mild volume loss of the brain. Vascular: Calcific atherosclerosis of the carotid siphons. No hyperdense vessel identified. Skull: Normal. Negative for fracture or focal lesion. Sinuses/Orbits: No acute finding. Other: None. IMPRESSION: 1. No acute intracranial abnormality identified. 2. Mild chronic microvascular ischemic changes and mild volume loss of the brain for age. 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 Electronically Signed   By: Mitzi Hansen M.D.   On: 02/25/2018 15:04    Pending Labs Unresulted Labs (From admission, onward)    Start     Ordered   02/28/18 1230  Vancomycin, trough  Once-Timed,   STAT     02/25/18 1442   02/25/18 1429  Ammonia  Once,   STAT     02/25/18 1428   02/25/18 1429  RPR  Once,   STAT     02/25/18 1428   02/25/18 1428  Urine drugs of abuse scrn w alc, routine (Ref Lab)  Once,   STAT     02/25/18 1427   02/25/18 1116  Urine culture  Once,   STAT     02/25/18 1116   02/25/18 1114  Gastrointestinal Panel by PCR , Stool  (Gastrointestinal Panel by PCR, Stool)  Once,   STAT     02/25/18 1113   02/25/18 1053  Culture, blood (Routine x 2)  BLOOD CULTURE X 2,   STAT     02/25/18 1053   Signed and Held  CBC  (enoxaparin (LOVENOX)    CrCl >/= 30 ml/min)  Once,   R    Comments:  Baseline for enoxaparin therapy IF NOT ALREADY DRAWN.  Notify MD if PLT < 100 K.    Signed and Held   Signed and Held  Creatinine, serum  (enoxaparin (LOVENOX)    CrCl >/= 30 ml/min)  Once,   R    Comments:  Baseline for enoxaparin therapy IF NOT ALREADY DRAWN.    Signed and Held   Signed and Held  Creatinine, serum  (enoxaparin (LOVENOX)    CrCl >/= 30 ml/min)  Weekly,   R    Comments:  while on enoxaparin therapy    Signed and Held   Signed and Held  Basic  metabolic panel  Tomorrow morning,   R     Signed and Held   Signed and Held  CBC  Tomorrow morning,   R     Signed and Held   Signed and Held  Ammonia  Once,   R     Signed and Held   Signed and Held  RPR  Once,   R     Signed and Held   Signed and Held  Culture, blood (x 2)  BLOOD CULTURE X 2,   STAT    Comments:  INITIATE ANTIBIOTICS WITHIN 1 HOUR AFTER BLOOD CULTURES DRAWN.  If unable to obtain blood cultures, call MD immediately regarding antibiotic instructions.    Signed and Held   Signed and Held  CBC with Differential  ONCE - STAT,   R     Signed and  Held   Signed and Held  Comprehensive metabolic panel  ONCE - STAT,   R     Signed and Held   Signed and Held  Lactic acid, plasma  STAT Now then every 3 hours,   STAT     Signed and Held   Signed and Held  Procalcitonin  ONCE - STAT,   R     Signed and Held   Signed and Held  Protime-INR  ONCE - STAT,   R     Signed and Held   Signed and Held  APTT  ONCE - STAT,   R     Signed and Held          Vitals/Pain Today's Vitals   02/25/18 1400 02/25/18 1415 02/25/18 1430 02/25/18 1506  BP: 139/66 115/60 119/81   Pulse: (!) 116 (!) 114 95   Resp: (!) 26 (!) 29 (!) 33   Temp:      TempSrc:      SpO2: 99% 100% 95%   Weight:      Height:      PainSc:    0-No pain    Isolation Precautions Enteric precautions (UV disinfection)  Medications Medications  sodium chloride 0.9 % bolus 500 mL (500 mLs Intravenous New Bag/Given 02/25/18 1429)  vancomycin (VANCOCIN) IVPB 1000 mg/200 mL premix (has no administration in time range)  ceFEPIme (MAXIPIME) 2 g in sodium chloride 0.9 % 100 mL IVPB (has no administration in time range)  sodium chloride 0.9 % bolus 1,000 mL (0 mLs Intravenous Stopped 02/25/18 1336)  sodium chloride 0.9 % bolus 1,000 mL (0 mLs Intravenous Stopped 02/25/18 1347)  piperacillin-tazobactam (ZOSYN) IVPB 3.375 g (0 g Intravenous Stopped 02/25/18 1305)  vancomycin (VANCOCIN) IVPB 1000 mg/200 mL premix (0 mg  Intravenous Stopped 02/25/18 1343)    Mobility non-ambulatory Report called to [facility] RN on 1C, pt going to room 104

## 2018-02-25 NOTE — ED Provider Notes (Addendum)
[facility] Emergency Department Provider Note  ____________________________________________   I have reviewed the triage vital signs and the nursing notes. Where available I have reviewed prior notes and, if possible and indicated, outside hospital notes.    HISTORY  Chief Complaint Emesis and Abdominal Pain    HPI Kristen Dudley is a 84 y.o. female with a history of stents in her legs and no other known medical history.  She presents with her son.  She is from [place].  She is with him now.  He does not know very much about her history he states.  She herself will not answer any questions although she can tell me the day and year, she does not answer many questions about her health.  Apparently used to get her care from [facility], but I cannot find any records online about her.  Therefore, it is a very limited history.  According to son she has these episodes where her heart goes fast and she gets shivery and feels bad.  This happens every once in a while if she eats the wrong thing he states.  Patient herself states that she is having a little bit of abdominal pain and she wants something to drink.  She is tried to vomit but not vomited, she has had diarrhea on the way in.  She was fine until around 930 this morning when all of this started.  Has not had any other recent illness.  Denies any fall.  No melena or bright red blood per rectum.  Level 5 chart caveat; no further history available due to patient status. Her son and she both state that there are no allergies.  History reviewed. No pertinent past medical history.  There are no active problems to display for this patient.   Past Surgical History:  Procedure Laterality Date  . IR ERCP EXCH REM STENTS BIL PAN DUCT INC DIL EA STENT EXCH     leg stents    Prior to Admission medications   Not on File    Allergies Patient has no known allergies.  No family history on  file.  Social History Social History   Tobacco Use  . Smoking status: Former Games developer  . Smokeless tobacco: Never Used  Substance Use Topics  . Alcohol use: Never    Frequency: Never  . Drug use: Never    Review of Systems Constitutional: No fever/chills Eyes: No visual changes. ENT: No sore throat. No stiff neck no neck pain Cardiovascular: Denies chest pain. Respiratory: Denies shortness of breath. Gastrointestinal:   no vomiting.  + diarrhea.  No constipation. Genitourinary: Negative for dysuria. Musculoskeletal: Negative lower extremity swelling Skin: Negative for rash. Neurological: Negative for severe headaches, focal weakness or numbness.   ____________________________________________   PHYSICAL EXAM:  VITAL SIGNS: ED Triage Vitals  Enc Vitals Group     BP 02/25/18 1056 (!) 159/69     Pulse Rate 02/25/18 1056 (!) 135     Resp 02/25/18 1056 (!) 23     Temp 02/25/18 1047 (!) 100.7 F (38.2 C)     Temp Source 02/25/18 1047 Oral     SpO2 02/25/18 1056 91 %     Weight 02/25/18 1048 193 lb (87.5 kg)     Height 02/25/18 1048 5\' 3"  (1.6 m)     Head Circumference --      Peak Flow --      Pain Score 02/25/18 1047 5     Pain  Loc --      Pain Edu? --      Excl. in GC? --     Constitutional: And is somewhat somnolent but looking around requesting water; answers questions with a delay, will tell me that is 2019 that she is in the hospital and her name. Eyes: Conjunctivae are normal Head: Atraumatic HEENT: No congestion/rhinnorhea. Mucous membranes are somewhat dry.  Oropharynx non-erythematous Neck:   Nontender with no meningismus, no masses, no stridor Cardiovascular: Tachycardic rate 130 regular rhythm. Grossly normal heart sounds.  Good peripheral circulation. Respiratory: Normal respiratory effort.  No retractions.  Somewhat diminished in the bases poor respiratory effort for me Abdominal: Soft and diffuse mild tenderness nonsurgical. No distention. No guarding  no rebound Back:  There is no focal tenderness or step off.  there is no midline tenderness there are no lesions noted. there is no CVA tenderness Musculoskeletal: No lower extremity tenderness, no upper extremity tenderness. No joint effusions, no DVT signs strong distal pulses no edema Neurologic:  Normal speech and language. No gross focal neurologic deficits are appreciated.  Skin:  Skin is warm, dry and intact. No rash noted.   ____________________________________________   LABS (all labs ordered are listed, but only abnormal results are displayed)  Labs Reviewed  CBC WITH DIFFERENTIAL/PLATELET - Abnormal; Notable for the following components:      Result Value   WBC 19.1 (*)    Neutro Abs 16.5 (*)    Abs Immature Granulocytes 0.12 (*)    All other components within normal limits  CG4 I-STAT (LACTIC ACID) - Abnormal; Notable for the following components:   Lactic Acid, Venous 4.49 (*)    All other components within normal limits  CULTURE, BLOOD (ROUTINE X 2)  CULTURE, BLOOD (ROUTINE X 2)  GASTROINTESTINAL PANEL BY PCR, STOOL (REPLACES STOOL CULTURE)  C DIFFICILE QUICK SCREEN W PCR REFLEX  URINE CULTURE  COMPREHENSIVE METABOLIC PANEL  PROTIME-INR  URINALYSIS, COMPLETE (UACMP) WITH MICROSCOPIC  INFLUENZA PANEL BY PCR (TYPE A & B)  BLOOD GAS, VENOUS  TROPONIN I  BRAIN NATRIURETIC PEPTIDE  I-STAT CG4 LACTIC ACID, ED  I-STAT CG4 LACTIC ACID, ED    Pertinent labs  results that were available during my care of the patient were reviewed by me and considered in my medical decision making (see chart for details). ____________________________________________  EKG  I personally interpreted any EKGs ordered by me or triage Sinus tach rate 122, normal axis no acute ST elevation nonspecific ST changes, lateral ST depression possibly rate related, ____________________________________________  RADIOLOGY  Pertinent labs & imaging results that were available during my care of the  patient were reviewed by me and considered in my medical decision making (see chart for details). If possible, patient and/or family made aware of any abnormal findings.  No results found. ____________________________________________    PROCEDURES  Procedure(s) performed: None  Procedures  Critical Care performed: CRITICAL CARE Performed by: Jeanmarie Plant   Total critical care time: 45 minutes  Critical care time was exclusive of separately billable procedures and treating other patients.  Critical care was necessary to treat or prevent imminent or life-threatening deterioration.  Critical care was time spent personally by me on the following activities: development of treatment plan with patient and/or surrogate as well as nursing, discussions with consultants, evaluation of patient's response to treatment, examination of patient, obtaining history from patient or surrogate, ordering and performing treatments and interventions, ordering and review of laboratory studies, ordering and review of radiographic studies,  pulse oximetry and re-evaluation of patient's condition.   ____________________________________________   INITIAL IMPRESSION / ASSESSMENT AND PLAN / ED COURSE  Pertinent labs & imaging results that were available during my care of the patient were reviewed by me and considered in my medical decision making (see chart for details).  Patient here with very limited prior history presents with fever, tachycardia, elevated lactic and white cell count.  She does meet here yet for sepsis I will start on 2 L of fluid, give broad-spectrum antibiotics and we will look for source.  Abdomen is nonsurgical but there is tenderness involved, she does complain of right-sided tenderness we will obtain CT scan, oxygen saturation when I am in the room is around 91-92 we have put her on oxygen will get a chest x-ray, will check a VBG, very limited history, we really do not know much about  what the patient is on her should be on her what her heart failure status is etc.  I will send cardiac enzymes, we will obtain EKG, I will send stool sample for bio fire and C. difficile, and we will continue to aggressively work-up for the patient.  She will require admission.  Have also requested via fax records from her prior hospital so he can get some good data as her son is not medical and seems to have somewhat limited knowledge of her history.  ----------------------------------------- 12:21 PM on 02/25/2018 -----------------------------------------  We have made attempts to call pharmacies in her hometown and here and we are unable to find a med list.  [place]  Patient did tell CT scan techs that she has an intolerance of some variety to IV contrast so we did not give her that.  Apparently, she passed out after getting it once.  This information she did not initially tell us.  ----------------------------------------- 12:50 PM on 02/25/2018 -----------------------------------------  Hr 108, bp 130s remains alert. Will give 2+ l of ivf given that her pressures are holding and we don't have any knowledge of her heart capabilities.    ____________________________________________   FINAL CLINICAL IMPRESSION(S) / ED DIAGNOSES  Final diagnoses:  Sepsis, due to unspecified organism, unspecified whether acute organ dysfunction present [facility](HCC)      This chart was dictated using voice recognition software.  Despite best efforts to proofread,  errors can occur which can change meaning.      Jeanmarie PlantMcShane, Asma Boldon A, MD 02/25/18 1129    Jeanmarie PlantMcShane, Kristapher [place] A, MD 02/25/18 1221    Jeanmarie PlantMcShane, Corayma Cashatt A, MD 02/25/18 1250    Jeanmarie PlantMcShane, Khaza Blansett A, MD 02/25/18 1321    Jeanmarie PlantMcShane, Khamila Bassinger A, MD 02/25/18 1323    Jeanmarie PlantMcShane, Delories Mauri A, MD 02/25/18 1345

## 2018-02-25 NOTE — Progress Notes (Signed)
CRITICAL VALUE ALERT  Critical Value: Lactic acid 2.4  Date & Time Notied: 02/25/18 1845  Provider Notified: Dr Elpidio AnisSudini  Orders Received/Actions taken: Notified Dr Elpidio AnisSudini of lactic acid 2.4. BP 99/48, HR 100, RR 18, O2 sats 100% on 2L O2, afebrile.  Pt on ABX and IVF. No new orders received. Per MD to continue to monitor.

## 2018-02-25 NOTE — ED Notes (Signed)
Date and time results received: 02/25/18 1104  Test: lactic acid Critical Value: 4.49  Name of Provider Notified: dr. Alphonzo Lemmingsmcshane  Orders Received? Or Actions Taken?: Orders Received - See Orders for details

## 2018-02-25 NOTE — Consult Note (Signed)
Pharmacy Antibiotic Note  Kristen HailstoneBetty S Dudley is a 84 y.o. female admitted on 02/25/2018 with sepsis. Patient presented with abdominal pain and emesis. Pharmacy has been consulted for Cefepime + Vancomycin dosing.  Plan: Patient received Zosyn 3.375 g IV x 1 and Vancomycin 1 g x 1 in the ED. Ordered Cefepime 2 g IV q12h and Vancomycin 1000 mg IV q24h. Will order Vancomycin trough for 11/30 @ 1230, prior to 4th dose. VT goal 15-20.  Vancomycin Kineticss Ussing adjusted body wt of 66.4 kg and CrCl 40.8 mL/min ke 0.038 Vd 46.5 T1/2 18.2 hours  Height: 5\' 3"  (160 cm) Weight: 193 lb (87.5 kg) IBW/kg (Calculated) : 52.4  Temp (24hrs), Avg:100.7 F (38.2 C), Min:100.7 F (38.2 C), Max:100.7 F (38.2 C)  Recent Labs  Lab 02/25/18 1059 02/25/18 1104  WBC 19.1*  --   CREATININE 1.00  --   LATICACIDVEN  --  4.49*    Estimated Creatinine Clearance: 40.8 mL/min (by C-G formula based on SCr of 1 mg/dL).    Allergies  Allergen Reactions  . Iohexol     "makes me pass out"    Antimicrobials this admission: 11/27 Zosyn x 1 11/27 Vancomycin >>  11/27 Cefepime >>  Dose adjustments this admission: N/A  Microbiology results: 11/27 BCx: sent 11/27 UCx: sent  11/27 GI Panel sent 11/27 Cdif PCR sent  Thank you for allowing pharmacy to be a part of this patient's care.   Kristen ReadingSavanna M Dudley, PharmD Pharmacy Resident  02/25/2018 2:41 PM

## 2018-02-25 NOTE — ED Notes (Signed)
Faxed Med. Records request waiting on them to be sent

## 2018-02-25 NOTE — Progress Notes (Signed)
PHARMACY - PHYSICIAN COMMUNICATION CRITICAL VALUE ALERT - BLOOD CULTURE IDENTIFICATION (BCID)  Flonnie HailstoneBetty S Kohlmann is an 84 y.o. female who [facility] on 02/25/2018 with a chief complaint of sepsis  Assessment:  3/4 Group B Strep (include suspected source if known)  Name of physician (or Provider) Contacted: Willis  Current antibiotics: vanc/cefepime/flagyl  Changes to prescribed antibiotics recommended:  N/a, Febrile today, WBC 26.7  No results found for this or any previous visit.  Serra Younan S 02/25/2018  10:04 PM

## 2018-02-25 NOTE — Consult Note (Signed)
CODE SEPSIS - PHARMACY COMMUNICATION  **Broad Spectrum Antibiotics should be administered within 1 hour of Sepsis diagnosis**  Time Code Sepsis Called/Page Received: 1123  Antibiotics Ordered: Vancomycin + Zosyn  Time of 1st antibiotic administration: 1235  Additional action taken by pharmacy: N/A  If necessary, Name of Provider/Nurse Contacted: N/A    Mauri ReadingSavanna M Martin, PharmD Pharmacy Resident  02/25/2018 11:24 AM

## 2018-02-25 NOTE — Progress Notes (Signed)
Family Meeting Note  Advance Directive:yes  Today a meeting took place with the Patient.  Patient is able to participate   The following clinical team members were present during this meeting:MD  The following were discussed:Patient's diagnosis:sepsis , Patient's progosis: Unable to determine and Goals for treatment: Full Code  Additional follow-up to be provided: prn  Time spent during discussion:20 minutes  Montell D Salary, MD  

## 2018-02-26 ENCOUNTER — Inpatient Hospital Stay (HOSPITAL_COMMUNITY)
Admit: 2018-02-26 | Discharge: 2018-02-26 | Disposition: A | Discharge status: Home / Self Care | Payer: Medicare Other | Attending: Specialist | Admitting: Specialist

## 2018-02-26 DIAGNOSIS — R7881 Bacteremia: Secondary | ICD-10-CM

## 2018-02-26 LAB — BASIC METABOLIC PANEL
Anion gap: 9 (ref 5–15)
BUN: 25 mg/dL — ABNORMAL HIGH (ref 8–23)
CALCIUM: 7.9 mg/dL — AB (ref 8.9–10.3)
CO2: 19 mmol/L — AB (ref 22–32)
Chloride: 107 mmol/L (ref 98–111)
Creatinine, Ser: 1.21 mg/dL — ABNORMAL HIGH (ref 0.44–1.00)
GFR, EST AFRICAN AMERICAN: 46 mL/min — AB (ref >60)
GFR, EST NON AFRICAN AMERICAN: 40 mL/min — AB (ref >60)
GLUCOSE: 126 mg/dL — AB (ref 70–99)
Potassium: 4 mmol/L (ref 3.5–5.1)
Sodium: 135 mmol/L (ref 135–145)

## 2018-02-26 LAB — RPR
RPR: NONREACTIVE
RPR: REACTIVE — AB

## 2018-02-26 LAB — CBC
HCT: 36.5 % (ref 36.0–46.0)
Hemoglobin: 11.7 g/dL — ABNORMAL LOW (ref 12.0–15.0)
MCH: 29.9 pg (ref 26.0–34.0)
MCHC: 32.1 g/dL (ref 30.0–36.0)
MCV: 93.4 fL (ref 80.0–100.0)
NRBC: 0 % (ref 0.0–0.2)
PLATELETS: 244 10*3/uL (ref 150–400)
RBC: 3.91 MIL/uL (ref 3.87–5.11)
RDW: 14.6 % (ref 11.5–15.5)
WBC: 26.5 10*3/uL — ABNORMAL HIGH (ref 4.0–10.5)

## 2018-02-26 LAB — RPR, QUANT+TP ABS (REFLEX)
Rapid Plasma Reagin, Quant: 1:1 {titer} — ABNORMAL HIGH
T Pallidum Abs: REACTIVE — AB

## 2018-02-26 LAB — URINE CULTURE: Culture: NO GROWTH

## 2018-02-26 MED ORDER — METRONIDAZOLE IN NACL 5-0.79 MG/ML-% IV SOLN
500.0000 mg | Freq: Three times a day (TID) | INTRAVENOUS | Status: DC
  Administered 2018-02-26 – 2018-02-27 (×3): 500 mg via INTRAVENOUS
  Filled 2018-02-26 (×6): qty 100

## 2018-02-26 MED ORDER — SODIUM CHLORIDE 0.9 % IV SOLN
2.0000 g | INTRAVENOUS | Status: DC
  Administered 2018-02-26 – 2018-03-01 (×4): 2 g via INTRAVENOUS
  Filled 2018-02-26: qty 2
  Filled 2018-02-26: qty 20
  Filled 2018-02-26 (×2): qty 2
  Filled 2018-02-26: qty 20

## 2018-02-26 MED ORDER — GUAIFENESIN-DM 100-10 MG/5ML PO SYRP
5.0000 mL | ORAL_SOLUTION | ORAL | Status: DC | PRN
  Administered 2018-02-27 (×2): 5 mL via ORAL
  Filled 2018-02-26 (×3): qty 5

## 2018-02-26 MED ORDER — PENICILLIN G 3 MILLION UNITS IVPB - SIMPLE MED
3.0000 10*6.[IU] | INTRAVENOUS | Status: DC
  Filled 2018-02-26 (×4): qty 100

## 2018-02-26 NOTE — Plan of Care (Signed)
Afebrile. VSS. Pt drowsy and sleepy most of the day. Easily aroused and follow commands.  Poor appetite.

## 2018-02-26 NOTE — Progress Notes (Signed)
[facility] [place] [facility]      PATIENT NAME: Kristen BatonBetty Dudley    MR#:  [id number]  DATE OF BIRTH:  02/24/1928  SUBJECTIVE:   Patient presented to the hospital due to abdominal pain and nausea.  Patient presently is quite lethargic and is a poor historian.  Patient noted to have CBD dilatation on CT scan but MR CP is negative for any acute obstruction.  Patient's LFTs are stable.  Blood cultures incidentally a positive for strep but source remains unclear.  Patient is afebrile.  REVIEW OF SYSTEMS:    Review of Systems  Unable to perform ROS: Mental acuity    Nutrition: regular Tolerating Diet: Yes Tolerating PT: Await Eval.   DRUG ALLERGIES:   Allergies  Allergen Reactions  . Iohexol     "makes me pass out"    VITALS:  Blood pressure (!) 102/52, pulse 79, temperature 97.8 F (36.6 C), temperature source Oral, resp. rate 18, height 5\' 3"  (1.6 m), weight 87.5 kg, SpO2 96 %.  PHYSICAL EXAMINATION:   Physical Exam  GENERAL:  84 y.o.-year-old patient lying in bed lethargic but follows simple commands.   EYES: Pupils equal, round, reactive to light and accommodation. No scleral icterus. Extraocular muscles intact.  HEENT: Head atraumatic, normocephalic. Oropharynx and nasopharynx clear.  NECK:  Supple, no jugular venous distention. No thyroid enlargement, no tenderness.  LUNGS: Poor Resp. effort, no wheezing, rales, rhonchi. No use of accessory muscles of respiration.  CARDIOVASCULAR: S1, S2 normal. No murmurs, rubs, or gallops.  ABDOMEN: Soft, nontender, nondistended. Bowel sounds present. No organomegaly or mass.  EXTREMITIES: No cyanosis, clubbing or edema b/l.    NEUROLOGIC: Cranial nerves II through XII are intact. No focal Motor or sensory deficits b/l. Globally weak   PSYCHIATRIC: The patient is alert and oriented x 1.  SKIN: No obvious rash, lesion, or ulcer.    LABORATORY PANEL:   CBC Recent Labs  Lab 02/26/18 0425  WBC 26.5*  HGB  11.7*  HCT 36.5  PLT 244   ------------------------------------------------------------------------------------------------------------------  Chemistries  Recent Labs  Lab 02/25/18 1552 02/26/18 0425  NA 136 135  K 3.0* 4.0  CL 104 107  CO2 23 19*  GLUCOSE 121* 126*  BUN 20 25*  CREATININE 1.09* 1.21*  CALCIUM 8.4* 7.9*  AST 26  --   ALT 16  --   ALKPHOS 58  --   BILITOT 1.4*  --    ------------------------------------------------------------------------------------------------------------------  Cardiac Enzymes Recent Labs  Lab 02/25/18 1059  TROPONINI <0.03   ------------------------------------------------------------------------------------------------------------------  RADIOLOGY:  Ct Abdomen Pelvis Wo Contrast  Result Date: 02/25/2018 CLINICAL DATA:  Nausea. Left-sided pain and low back pain. Vomiting. Sepsis. EXAM: CT ABDOMEN AND PELVIS WITHOUT CONTRAST TECHNIQUE: Multidetector CT imaging of the abdomen and pelvis was performed following the standard protocol without IV contrast. COMPARISON:  None. FINDINGS: Lower chest: No acute abnormality. Hepatobiliary: The liver parenchyma appears normal except for a few calcified granulomas. There is dilatation of the common hepatic and common bile duct to a diameter of 16 mm. No discrete stone or mass. Pancreas: Unremarkable. No pancreatic ductal dilatation or surrounding inflammatory changes. Spleen: Numerous calcified granulomas in the spleen. Adrenals/Urinary Tract: Adrenal glands are unremarkable. Kidneys are normal, without renal calculi, focal lesion, or hydronephrosis. Bladder is unremarkable. Stomach/Bowel: Stomach is within normal limits. Appendix is not visualized. No evidence of bowel wall thickening, distention, or inflammatory changes. Multiple diverticula in the left side of the colon. No discrete diverticulitis. Vascular/Lymphatic: There  is focal dilatation of the abdominal aorta to a maximum diameter of 3.8 cm.  Extensive aortic atherosclerosis. Adenopathy. Reproductive: Status post hysterectomy. No adnexal masses. Other: No abdominal wall hernia or abnormality. No abdominopelvic ascites. Musculoskeletal: No acute or significant osseous findings. IMPRESSION: 1. Dilatation of the common hepatic and common bile duct, greater than would be expected after cholecystectomy. Surgical history in the medical record indicates prior bile duct stents. There is only slight intrahepatic ductal dilatation. 2. Extensive aortic atherosclerosis with aneurysmal dilatation of the abdominal aorta to a diameter of 3.8 cm. Recommend followup by ultrasound in 2 years. This recommendation follows ACR consensus guidelines: White Paper of the ACR Incidental Findings Committee II on Vascular Findings. J Am Coll Radiol 2013; 10:789-794. 3. No acute abnormalities. Electronically Signed   By: Francene Boyers M.D.   On: 02/25/2018 12:38   Dg Chest 2 View  Result Date: 02/25/2018 CLINICAL DATA:  Nausea.  Left-sided pain and low back pain. EXAM: CHEST - 2 VIEW COMPARISON:  None. FINDINGS: Heart size and pulmonary vascularity are normal. Aortic atherosclerosis. No infiltrates or effusions. No acute bone abnormality. Calcified lymph node in the inferior aspect of the right hilum. Calcified granuloma at the right lung base. Calcified lymph node in the azygos region. IMPRESSION: No acute abnormalities. Aortic Atherosclerosis (ICD10-I70.0). Electronically Signed   By: Francene Boyers M.D.   On: 02/25/2018 12:28   Ct Head Wo Contrast  Result Date: 02/25/2018 CLINICAL DATA:  84 y/o  F; altered mental status. EXAM: CT HEAD WITHOUT CONTRAST TECHNIQUE: Contiguous axial images were obtained from the base of the skull through the vertex without intravenous contrast. COMPARISON:  None. FINDINGS: Brain: No evidence of acute infarction, hemorrhage, hydrocephalus, extra-axial collection or mass lesion/mass effect. 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 Nonspecific white matter hypodensities are compatible with mild chronic microvascular ischemic changes for age and there is mild volume loss of the brain. Vascular: Calcific atherosclerosis of the carotid siphons. No hyperdense vessel identified. Skull: Normal. Negative for fracture or focal lesion. Sinuses/Orbits: No acute finding. Other: None. IMPRESSION: 1. No acute intracranial abnormality identified. 2. Mild chronic microvascular ischemic changes and mild volume loss of the brain for age. 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 Electronically Signed   By: Mitzi Hansen M.D.   On: 02/25/2018 15:04   Mr 3d Recon At Scanner  Result Date: 02/26/2018 CLINICAL DATA:  Left-sided abdominal pain and nausea and vomiting. Biliary ductal dilatation on recent CT. Prior cholecystectomy. EXAM: MRI ABDOMEN WITHOUT AND WITH CONTRAST (INCLUDING MRCP) TECHNIQUE: Multiplanar multisequence MR imaging of the abdomen was performed both before and after the administration of intravenous contrast. Heavily T2-weighted images of the biliary and pancreatic ducts were obtained, and three-dimensional MRCP images were rendered by post processing. CONTRAST:  9 mL Gadavist COMPARISON:  CT on 02/25/2018 FINDINGS: Lower chest: No acute findings. Hepatobiliary: No hepatic masses identified. Prior cholecystectomy noted. Diffuse intra and extrahepatic biliary ductal dilatation is seen, with common bile duct measuring 15 mm. No evidence of choledocholithiasis or biliary stricture. Pancreas: No mass or inflammatory changes. No evidence of pancreatic ductal dilatation or pancreas divisum. Spleen:  Within normal limits in size and appearance. Adrenals/Urinary Tract: No masses identified. No evidence of hydronephrosis. Stomach/Bowel: Visualized portion unremarkable. Vascular/Lymphatic: No pathologically enlarged lymph nodes identified. 3.7 cm infrarenal  abdominal aortic aneurysm is stable. Other:  None. Musculoskeletal:  No suspicious bone lesions identified. IMPRESSION: Prior cholecystectomy. Diffuse biliary  ductal dilatation, but no choledocholithiasis or other obstructing etiology visualized. Stable 3.7 cm abdominal aortic aneurysm. Recommend followup by ultrasound in 2 years. This recommendation follows ACR consensus guidelines: White Paper of the ACR Incidental Findings Committee II on Vascular Findings. J Am Coll Radiol 2013; 10:789-794. Electronically Signed   By: Myles Rosenthal M.D.   On: 02/26/2018 01:43   Dg Chest Port 1 View  Result Date: 02/25/2018 CLINICAL DATA:  Sepsis. EXAM: PORTABLE CHEST 1 VIEW COMPARISON:  Radiographs of February 25, 2018. FINDINGS: Stable cardiomediastinal silhouette. Atherosclerosis of thoracic aorta is noted. No pneumothorax pleural effusion is noted. Left lung is unremarkable. Stable calcified granuloma seen in right lower lobe. Mildly increased right basilar opacity is noted which may represent atelectasis or infiltrate. Bony thorax is unremarkable. IMPRESSION: Mildly increased right basilar densities are noted which may represent atelectasis or possibly infiltrate. Aortic Atherosclerosis (ICD10-I70.0). Electronically Signed   By: Lupita Raider, M.D.   On: 02/25/2018 16:10   Mr Abdomen Mrcp Vivien Rossetti Contast  Result Date: 02/26/2018 CLINICAL DATA:  Left-sided abdominal pain and nausea and vomiting. Biliary ductal dilatation on recent CT. Prior cholecystectomy. EXAM: MRI ABDOMEN WITHOUT AND WITH CONTRAST (INCLUDING MRCP) TECHNIQUE: Multiplanar multisequence MR imaging of the abdomen was performed both before and after the administration of intravenous contrast. Heavily T2-weighted images of the biliary and pancreatic ducts were obtained, and three-dimensional MRCP images were rendered by post processing. CONTRAST:  9 mL Gadavist COMPARISON:  CT on 02/25/2018 FINDINGS: Lower chest: No acute findings. Hepatobiliary: No  hepatic masses identified. Prior cholecystectomy noted. Diffuse intra and extrahepatic biliary ductal dilatation is seen, with common bile duct measuring 15 mm. No evidence of choledocholithiasis or biliary stricture. Pancreas: No mass or inflammatory changes. No evidence of pancreatic ductal dilatation or pancreas divisum. Spleen:  Within normal limits in size and appearance. Adrenals/Urinary Tract: No masses identified. No evidence of hydronephrosis. Stomach/Bowel: Visualized portion unremarkable. Vascular/Lymphatic: No pathologically enlarged lymph nodes identified. 3.7 cm infrarenal abdominal aortic aneurysm is stable. Other:  None. Musculoskeletal:  No suspicious bone lesions identified. IMPRESSION: Prior cholecystectomy. Diffuse biliary ductal dilatation, but no choledocholithiasis or other obstructing etiology visualized. Stable 3.7 cm abdominal aortic aneurysm. Recommend followup by ultrasound in 2 years. This recommendation follows ACR consensus guidelines: White Paper of the ACR Incidental Findings Committee II on Vascular Findings. J Am Coll Radiol 2013; 10:789-794. Electronically Signed   By: Myles Rosenthal M.D.   On: 02/26/2018 01:43     ASSESSMENT AND PLAN:   84 year old female who presents to the hospital due to abdominal pain, nausea and admitted for suspected sepsis.  1.  Sepsis-patient meets criteria given her leukocytosis, tachycardia now having positive blood cultures for Streptococcus. -Source of the sepsis remains unclear.  Initially thought to be a GI source given patient's abdominal pain nausea and abnormal imaging findings suggestive of common bile duct dilatation.  Patient's MRCP although was negative for any obstruction.  Patient's LFTs are stable bilirubins are stable. - Presently patient is afebrile and hemodynamically stable.  -Continue empiric ceftriaxone, Flagyl for now.  We will get infectious disease consult.  Discussed the case with Dr. Rivka Safer.   2.  Strep agalactiae  bacteremia- source of pt's sepsis.   - source remains unclear.  ?? GI source.  Will get Echocardiogram to r/o Endocarditis.  - cont. Empiric Ceftriaxone, Flagyl and await further ID input.   3. Leukocytosis - due to # 1&2 - follow with treatment and if not improving will consider getting CT chest/abd/pelvis  with contrast to r/o abscess.   4.  Acute kidney injury-secondary to underlying sepsis and relative hypotension. -Continue IV fluids, follow BUN and creatinine urine output.  Renal dose meds, avoid nephrotoxins.  All the records are reviewed and case discussed with Care Management/Social Worker. Management plans discussed with the patient, family and they are in agreement.  CODE STATUS: Full code  DVT Prophylaxis: Lovenox  TOTAL TIME TAKING CARE OF THIS PATIENT: 30 minutes.   POSSIBLE D/C IN 1-2 DAYS, DEPENDING ON CLINICAL CONDITION.   Houston Siren M.D on 02/26/2018 at 12:31 PM  Between 7am to 6pm - Pager - [phone]  After 6pm go to [email or web address] - Social research officer, government  [facility] [place] [facility]  Office  [phone]  CC: Primary care physician; Patient, No Pcp Per

## 2018-02-26 NOTE — Progress Notes (Signed)
   02/26/18 0100  Clinical Encounter Type  Visited With Patient;Patient and family together  Visit Type Initial;Follow-up  Referral From Family;Chaplain  Spiritual Encounters  Spiritual Needs Prayer;Other (Comment)  Advance Directives (For Healthcare)  Does Patient Have a Medical Advance Directive? Yes  Does patient want to make changes to medical advance directive? Yes (Inpatient - patient requests chaplain consult to change a medical advance directive)  Type of Advance Directive Healthcare Power of [phone]

## 2018-02-26 NOTE — Progress Notes (Signed)
PHARMACY - PHYSICIAN COMMUNICATION CRITICAL VALUE ALERT - BLOOD CULTURE IDENTIFICATION (BCID)  Flonnie HailstoneBetty S Fontanella is an 84 y.o. female who [facility]25/2018 with a chief complaint of sepsis  Assessment:  3/4 Group B Strep (include suspected source if known)  Name of physician (or Provider) Contacted: Sainani  Current antibiotics: vanc/cefepime/flagyl  Changes to prescribed antibiotics recommended:  Dr. Cherlynn KaiserSainani okay with narrowing to penicillin per protocol. Would be 4 million units IV Q4H but due to renal function reduce to 3 million units IV Q4H (18 million units per day)  Results for orders placed or performed during the hospital encounter of 02/25/18  Blood Culture ID Panel (Reflexed) (Collected: 02/25/2018 10:59 AM)  Result Value Ref Range   Enterococcus species NOT DETECTED NOT DETECTED   Listeria monocytogenes NOT DETECTED NOT DETECTED   Staphylococcus species NOT DETECTED NOT DETECTED   Staphylococcus aureus (BCID) NOT DETECTED NOT DETECTED   Streptococcus species DETECTED (A) NOT DETECTED   Streptococcus agalactiae DETECTED (A) NOT DETECTED   Streptococcus pneumoniae NOT DETECTED NOT DETECTED   Streptococcus pyogenes NOT DETECTED NOT DETECTED   Acinetobacter baumannii NOT DETECTED NOT DETECTED   Enterobacteriaceae species NOT DETECTED NOT DETECTED   Enterobacter cloacae complex NOT DETECTED NOT DETECTED   Escherichia coli NOT DETECTED NOT DETECTED   Klebsiella oxytoca NOT DETECTED NOT DETECTED   Klebsiella pneumoniae NOT DETECTED NOT DETECTED   Proteus species NOT DETECTED NOT DETECTED   Serratia marcescens NOT DETECTED NOT DETECTED   Haemophilus influenzae NOT DETECTED NOT DETECTED   Neisseria meningitidis NOT DETECTED NOT DETECTED   Pseudomonas aeruginosa NOT DETECTED NOT DETECTED   Candida albicans NOT DETECTED NOT DETECTED   Candida glabrata NOT DETECTED NOT DETECTED   Candida krusei NOT DETECTED NOT DETECTED   Candida parapsilosis NOT DETECTED NOT  DETECTED   Candida tropicalis NOT DETECTED NOT DETECTED    Carola FrostNathan A Katelind Pytel, Pharm.D., BCPS Clinical Pharmacist 02/26/2018  10:28 AM

## 2018-02-26 NOTE — Progress Notes (Signed)
*  PRELIMINARY RESULTS* Echocardiogram 2D Echocardiogram has been performed.  Joanette GulaJoan M Raj Landress 02/26/2018, 12:02 PM

## 2018-02-27 ENCOUNTER — Inpatient Hospital Stay: Payer: Medicare Other

## 2018-02-27 DIAGNOSIS — Z9049 Acquired absence of other specified parts of digestive tract: Secondary | ICD-10-CM

## 2018-02-27 DIAGNOSIS — W458XXA Other foreign body or object entering through skin, initial encounter: Secondary | ICD-10-CM

## 2018-02-27 DIAGNOSIS — I739 Peripheral vascular disease, unspecified: Secondary | ICD-10-CM

## 2018-02-27 DIAGNOSIS — Z91041 Radiographic dye allergy status: Secondary | ICD-10-CM

## 2018-02-27 DIAGNOSIS — S91104A Unspecified open wound of right lesser toe(s) without damage to nail, initial encounter: Secondary | ICD-10-CM

## 2018-02-27 DIAGNOSIS — R7881 Bacteremia: Secondary | ICD-10-CM

## 2018-02-27 DIAGNOSIS — E8770 Fluid overload, unspecified: Secondary | ICD-10-CM

## 2018-02-27 DIAGNOSIS — Z95820 Peripheral vascular angioplasty status with implants and grafts: Secondary | ICD-10-CM

## 2018-02-27 DIAGNOSIS — R0602 Shortness of breath: Secondary | ICD-10-CM

## 2018-02-27 DIAGNOSIS — Z7982 Long term (current) use of aspirin: Secondary | ICD-10-CM

## 2018-02-27 DIAGNOSIS — Z7902 Long term (current) use of antithrombotics/antiplatelets: Secondary | ICD-10-CM

## 2018-02-27 DIAGNOSIS — Z87891 Personal history of nicotine dependence: Secondary | ICD-10-CM

## 2018-02-27 DIAGNOSIS — B951 Streptococcus, group B, as the cause of diseases classified elsewhere: Secondary | ICD-10-CM

## 2018-02-27 LAB — CBC
HEMATOCRIT: 34.8 % — AB (ref 36.0–46.0)
Hemoglobin: 10.9 g/dL — ABNORMAL LOW (ref 12.0–15.0)
MCH: 29.8 pg (ref 26.0–34.0)
MCHC: 31.3 g/dL (ref 30.0–36.0)
MCV: 95.1 fL (ref 80.0–100.0)
NRBC: 0 % (ref 0.0–0.2)
Platelets: 250 10*3/uL (ref 150–400)
RBC: 3.66 MIL/uL — ABNORMAL LOW (ref 3.87–5.11)
RDW: 14.8 % (ref 11.5–15.5)
WBC: 17.2 10*3/uL — ABNORMAL HIGH (ref 4.0–10.5)

## 2018-02-27 LAB — BLOOD GAS, ARTERIAL
Acid-base deficit: 7.1 mmol/L — ABNORMAL HIGH (ref 0.0–2.0)
BICARBONATE: 16.8 mmol/L — AB (ref 20.0–28.0)
FIO2: 0.32
O2 Saturation: 97.7 %
Patient temperature: 37
pCO2 arterial: 29 mmHg — ABNORMAL LOW (ref 32.0–48.0)
pH, Arterial: 7.37 (ref 7.350–7.450)
pO2, Arterial: 102 mmHg (ref 83.0–108.0)

## 2018-02-27 LAB — BASIC METABOLIC PANEL
ANION GAP: 8 (ref 5–15)
BUN: 23 mg/dL (ref 8–23)
CO2: 20 mmol/L — ABNORMAL LOW (ref 22–32)
Calcium: 8.1 mg/dL — ABNORMAL LOW (ref 8.9–10.3)
Chloride: 109 mmol/L (ref 98–111)
Creatinine, Ser: 0.98 mg/dL (ref 0.44–1.00)
GFR calc Af Amer: 60 mL/min — ABNORMAL LOW (ref >60)
GFR calc non Af Amer: 52 mL/min — ABNORMAL LOW (ref >60)
Glucose, Bld: 109 mg/dL — ABNORMAL HIGH (ref 70–99)
POTASSIUM: 4.2 mmol/L (ref 3.5–5.1)
Sodium: 137 mmol/L (ref 135–145)

## 2018-02-27 LAB — HEPATIC FUNCTION PANEL
ALT: 19 U/L (ref 0–44)
AST: 31 U/L (ref 15–41)
Albumin: 3.5 g/dL (ref 3.5–5.0)
Alkaline Phosphatase: 57 U/L (ref 38–126)
Bilirubin, Direct: 0.1 mg/dL (ref 0.0–0.2)
Indirect Bilirubin: 0.7 mg/dL (ref 0.3–0.9)
TOTAL PROTEIN: 6.8 g/dL (ref 6.5–8.1)
Total Bilirubin: 0.8 mg/dL (ref 0.3–1.2)

## 2018-02-27 LAB — LACTIC ACID, PLASMA
Lactic Acid, Venous: 1.2 mmol/L (ref 0.5–1.9)
Lactic Acid, Venous: 1.3 mmol/L (ref 0.5–1.9)

## 2018-02-27 LAB — ECHOCARDIOGRAM COMPLETE
Height: 63 in
Weight: 3088 oz

## 2018-02-27 LAB — TROPONIN I
Troponin I: 0.35 ng/mL (ref <0.03)
Troponin I: 0.26 ng/mL (ref <0.03)
Troponin I: 0.26 ng/mL (ref <0.03)

## 2018-02-27 MED ORDER — FUROSEMIDE 10 MG/ML IJ SOLN
40.0000 mg | Freq: Once | INTRAMUSCULAR | Status: AC
  Administered 2018-02-27: 07:00:00 40 mg via INTRAVENOUS
  Filled 2018-02-27: qty 4

## 2018-02-27 MED ORDER — IPRATROPIUM-ALBUTEROL 0.5-2.5 (3) MG/3ML IN SOLN
3.0000 mL | RESPIRATORY_TRACT | Status: DC | PRN
  Administered 2018-02-27: 02:00:00 3 mL via RESPIRATORY_TRACT
  Filled 2018-02-27: qty 3

## 2018-02-27 MED ORDER — SALINE SPRAY 0.65 % NA SOLN
1.0000 | NASAL | Status: DC | PRN
  Filled 2018-02-27: qty 44

## 2018-02-27 NOTE — Progress Notes (Signed)
Once ABGs were resulted, MD came to floor, ordered EKG and Troponins, then added a lactic acid run.  If lactic comes back normal, may try lasix...will determine once results are available.  Pt could potentially go to stepdown and be put on bipap, but will be determined after these tests have been completed.  Pt resting, but continues to be tachypneic at 34 - 38 breaths per minute.

## 2018-02-27 NOTE — Care Management Important Message (Signed)
Copy of signed IM left with patient in room.  

## 2018-02-27 NOTE — Consult Note (Signed)
NAME: Kristen Dudley  DOB: Oct 12, 1927  MRN: [id number]  Date/Time: 02/27/2018 3:12 PM Subjective:  REASON FOR CONSULT: Group B streptococcus bacteremia ?history from patient and son Kristen Dudley is a 84 y.o.female  with a history of PAD s/p Stent rt leg on plavix and aspirin was admitted with sudden onset chills, on 02/25/18. Pt says she and her son went to [employer] for breakfast on 02/25/18 but as it was very crowded they turned back and planned to go to [employer]. They stopped on the their way to go home and she came home and felt very chilly and started to have rigors. She went to bed and her son wanted to bring her to ED but she waited for an hour. As she was not feeling any better and was c/p of some pain on her left side the son drove her to ED. She was able to walk to the car. In the Ed her temp was 100.8, Bp 163/69 and she was confused and unable to give history. She had  CT abdomen which showed dilated biliary system and MRI done which was showing the same but no obstruction. She was started on vanco and zosyn, Blood culture came back as GBS and I am asked to see the patient. Her antibiotic was de-escalated to penicillin and then changed to ceftriaxone+ flagyl as we were not sure of the source,  Today the patient is more alert and able to give history. She says she is having some trouble breathing since last night. She also aid that she snipped a skin callus on her rt 2nd toe under the nail last week and it bled a lot. She apparently used a plastic bag to stop the bleeding and the next day she got an antibiotic spray and bandaid. The toe was painful for a few days. The son did not see the wound as it was covered with a bandaid  She did not notice any pain , or redness of the rt leg. She denies any abdominal pain, or vomiting eventhough her son said that she c/o left sided abdominal pain   PMH PAD  PSH Cholecystectomy Stent placed rt leg     FH Mother died of lung  cancer Father had CAD Brothers had DM  SH Used to lives in [place] to [place] to live with her son a month ago Ex smoker No alcohol  Allergies  Allergen Reactions  . Iohexol     "makes me pass out"   ? Current Facility-Administered Medications  Medication Dose Route Frequency Provider Last Rate Last Dose  . acetaminophen (TYLENOL) tablet 650 mg  650 mg Oral Q6H PRN Salary, Montell D, MD       Or  . acetaminophen (TYLENOL) suppository 650 mg  650 mg Rectal Q6H PRN Salary, Montell D, MD      . cefTRIAXone (ROCEPHIN) 2 g in sodium chloride 0.9 % 100 mL IVPB  2 g Intravenous Q24H Houston Siren, MD 200 mL/hr at 02/27/18 1255 2 g at 02/27/18 1255  . enoxaparin (LOVENOX) injection 40 mg  40 mg Subcutaneous Q24H Salary, Montell D, MD   40 mg at 02/26/18 1719  . guaiFENesin-dextromethorphan (ROBITUSSIN DM) 100-10 MG/5ML syrup 5 mL  5 mL Oral Q4H PRN Houston Siren, MD   5 mL at 02/27/18 0005  . ipratropium-albuterol (DUONEB) 0.5-2.5 (3) MG/3ML nebulizer solution 3 mL  3 mL Nebulization Q4H PRN Oralia Manis, MD   3 mL at 02/27/18 0154  .  metroNIDAZOLE (FLAGYL) IVPB 500 mg  500 mg Intravenous Q8H Houston SirenSainani, Vivek J, MD 100 mL/hr at 02/27/18 1038 500 mg at 02/27/18 1038  . ondansetron (ZOFRAN) tablet 4 mg  4 mg Oral Q6H PRN Salary, Montell D, MD       Or  . ondansetron (ZOFRAN) injection 4 mg  4 mg Intravenous Q6H PRN Salary, Montell D, MD      . traZODone (DESYREL) tablet 50 mg  50 mg Oral QHS PRN Oralia ManisWillis, David, MD   50 mg at 02/26/18 0108     Abtx:  Anti-infectives (From admission, onward)   Start     Dose/Rate Route Frequency Ordered Stop   02/26/18 2200  ceFEPIme (MAXIPIME) 2 g in sodium chloride 0.9 % 100 mL IVPB  Status:  Discontinued     2 g 200 mL/hr over 30 Minutes Intravenous Every 12 hours 02/25/18 1436 02/26/18 1028   02/26/18 1800  metroNIDAZOLE (FLAGYL) IVPB 500 mg     500 mg 100 mL/hr over 60 Minutes Intravenous Every 8 hours 02/26/18 1114     02/26/18 1200   penicillin G 3 million units in sodium chloride 0.9% 100 mL IVPB  Status:  Discontinued     3 Million Units 200 mL/hr over 30 Minutes Intravenous Every 4 hours 02/26/18 1028 02/26/18 1114   02/26/18 1200  cefTRIAXone (ROCEPHIN) 2 g in sodium chloride 0.9 % 100 mL IVPB     2 g 200 mL/hr over 30 Minutes Intravenous Every 24 hours 02/26/18 1114     02/26/18 0100  ceFEPIme (MAXIPIME) 2 g in sodium chloride 0.9 % 100 mL IVPB  Status:  Discontinued     2 g 200 mL/hr over 30 Minutes Intravenous Every 12 hours 02/25/18 1434 02/25/18 1436   02/25/18 1600  ceFEPIme (MAXIPIME) 2 g in sodium chloride 0.9 % 100 mL IVPB  Status:  Discontinued     2 g 200 mL/hr over 30 Minutes Intravenous  Once 02/25/18 1546 02/25/18 1758   02/25/18 1600  metroNIDAZOLE (FLAGYL) IVPB 500 mg  Status:  Discontinued     500 mg 100 mL/hr over 60 Minutes Intravenous Every 8 hours 02/25/18 1546 02/26/18 1028   02/25/18 1600  vancomycin (VANCOCIN) IVPB 1000 mg/200 mL premix  Status:  Discontinued     1,000 mg 200 mL/hr over 60 Minutes Intravenous  Once 02/25/18 1546 02/25/18 1758   02/25/18 1300  vancomycin (VANCOCIN) IVPB 1000 mg/200 mL premix  Status:  Discontinued     1,000 mg 200 mL/hr over 60 Minutes Intravenous Every 24 hours 02/25/18 1434 02/26/18 1028   02/25/18 1200  vancomycin (VANCOCIN) injection 1,000 mg  Status:  Discontinued     1,000 mg Intravenous  Once 02/25/18 1121 02/25/18 1136   02/25/18 1200  vancomycin (VANCOCIN) IVPB 1000 mg/200 mL premix     1,000 mg 200 mL/hr over 60 Minutes Intravenous  Once 02/25/18 1136 02/25/18 1343   02/25/18 1130  piperacillin-tazobactam (ZOSYN) IVPB 3.375 g     3.375 g 100 mL/hr over 30 Minutes Intravenous  Once 02/25/18 1121 02/25/18 1305      REVIEW OF SYSTEMS:  Const:  fever,  chills, negative weight loss Eyes: negative diplopia or visual changes, negative eye pain ENT: negative coryza, negative sore throat Resp: negative cough, hemoptysis, dyspnea Cards: negative for  chest pain, palpitations, lower extremity edema GU: negative for frequency, dysuria and hematuria GI: Negative for abdominal pain, diarrhea, bleeding, constipation Skin: negative for rash and pruritus Heme: negative for easy bruising  and gum/nose bleeding MS: negative for myalgias, arthralgias, back pain and muscle weakness Neurolo:confusion and does not remember well what happened on 02/25/18 Psych: negative for feelings of anxiety, depression  Endocrine: no polyuria or polydipsia Allergy/Immunology- negative for any medication or food allergies ? Pertinent Positives include : Objective:  VITALS:  BP (!) 106/52 (BP Location: Left Arm)   Pulse 77   Temp 98 F (36.7 C) (Oral)   Resp 20   Ht 5\' 3"  (1.6 m)   Wt 87.5 kg   SpO2 97%   BMI 34.19 kg/m  PHYSICAL EXAM:  General: Alert, cooperative, no distress, appears stated age. Oriented X 5 Head: Normocephalic, without obvious abnormality, atraumatic. Eyes: Conjunctivae clear, anicteric sclerae. Pupils are equal ENT Nares normal. No drainage or sinus tenderness. Lips, mucosa, and tongue normal. No Thrush Dentition okay Neck: Supple, symmetrical, no adenopathy, thyroid: non tender no carotid bruit and no JVD. Back: No CVA tenderness. Lungs: b/l air entry- crepts present Heart: Regular rate and rhythm, no murmur, rub or gallop. Abdomen: Soft, non-tender,not distended. Bowel sounds normal. No masses Extremities: rt foot- 2 nd toe- has a small wound- not significant- no erythema or tenderness    atraumatic, no cyanosis. No edema. No clubbing Rt leg surgical scar, the rt leg is slightly bigger than left. But no erythema, tenderness Skin: No rashes or lesions. Or bruising Lymph: Cervical, supraclavicular normal. Neurologic: Grossly non-focal Pertinent Labs Lab Results CBC    Component Value Date/Time   WBC 17.2 (H) 02/27/2018 0450   RBC 3.66 (L) 02/27/2018 0450   HGB 10.9 (L) 02/27/2018 0450   HCT 34.8 (L) 02/27/2018 0450    PLT 250 02/27/2018 0450   MCV 95.1 02/27/2018 0450   MCH 29.8 02/27/2018 0450   MCHC 31.3 02/27/2018 0450   RDW 14.8 02/27/2018 0450   LYMPHSABS 0.8 02/25/2018 1552   MONOABS 2.0 (H) 02/25/2018 1552   EOSABS 0.1 02/25/2018 1552   BASOSABS 0.1 02/25/2018 1552   CBC Latest Ref Rng & Units 02/27/2018 02/26/2018 02/25/2018  WBC 4.0 - 10.5 K/uL 17.2(H) 26.5(H) 26.7(H)  Hemoglobin 12.0 - 15.0 g/dL 10.9(L) 11.7(L) 12.7  Hematocrit 36.0 - 46.0 % 34.8(L) 36.5 38.4  Platelets 150 - 400 K/uL 250 244 296   CMP Latest Ref Rng & Units 02/27/2018 02/26/2018 02/25/2018  Glucose 70 - 99 mg/dL 161(W) 960(A) 540(J)  BUN 8 - 23 mg/dL 23 81(X) 20  Creatinine 0.44 - 1.00 mg/dL 9.14 7.82(N) 5.62(Z)  Sodium 135 - 145 mmol/L 137 135 136  Potassium 3.5 - 5.1 mmol/L 4.2 4.0 3.0(L)  Chloride 98 - 111 mmol/L 109 107 104  CO2 22 - 32 mmol/L 20(L) 19(L) 23  Calcium 8.9 - 10.3 mg/dL 8.1(L) 7.9(L) 8.4(L)  Total Protein 6.5 - 8.1 g/dL - - 6.4(L)  Total Bilirubin 0.3 - 1.2 mg/dL - - 1.4(H)  Alkaline Phos 38 - 126 U/L - - 58  AST 15 - 41 U/L - - 26  ALT 0 - 44 U/L - - 16      Microbiology: 11/27 BC- Group B streptococcus  IMAGING RESULTS: 11/27    11/29: pulmonary congestion ? Impression/Recommendation ?84 y.o.female  with a history of PAD s/p Stent rt leg on plavix and aspirin was admitted with sudden onset chills, on 02/25/18. ? ?Group B streptococcus bacteremia- unclear source of entry -This is a  GI/GU organism and usually seen with skin infections. Can cause endocarditis Other than the small wound on her 2nd rt toe there is no other  finding on the skin. It could very well have been the entry point as it was a traumatic wound ( due to clipping sustained last week)) and it had bled a lot due to plavix and aspirin and it was painful for 2-3 days after . But now it is insignificant looking. Could it be a GI source- abdomen non tender- CT scan/MRI showed dilated bilary system. LFT from 11/27 was okay-  will repeat one today  My concern is whether she is at risk for endocarditis- 2decho does not show any vegetation She has sob, CXR today shows fluid overload ( was okay on 11/27) Is the SOB due to fluid overload or endocarditis with valve dysfunction She also has mildly elevatd troponin I do not want to subject her to unnecessary TEE. IF repeat culture remains neg and if SOB resolves quickly will not need TEE  Leucocytosis is improving- continue ceftriaxone ( flagyl can be discontinued) She wll need minimum of 10-14  days of IV( provided endocarditis is not a concern)  PAD -s/p rt leg stent on plavix and aspirin  Ex smoker H/o cholecystectomy ___________________________________________________ Discussed with patient, and her son

## 2018-02-27 NOTE — Progress Notes (Signed)
Around midnight tonight, pt started having trouble catching her breathe.  She said it felt like she wasn't pulling air into her lungs.  Upper lung sounds were clear, but lower lobes were severely diminished.  O2 put on pt at 2L for comfort.  MD ordered chest xray.  Walked past patients room about 20 minutes later and she was hanging over the side of her bed gasping for air, asking for a wet washrag, a fan and said she wasn't feeling good at all.  Increased O2 to 3L, messaged MD.  Jovita GammaGave pt a nebulizer treatment, MD ordered ABG.  MD will reassess once ABG results are in.

## 2018-02-27 NOTE — Progress Notes (Signed)
[facility] [place] [facility]      PATIENT NAME: Kristen Dudley    MR#:  [id number]  DATE OF BIRTH:  [birth year]  SUBJECTIVE:   More awake and alert today.  Says she feels better.  White cell count is improving.  Afebrile and hemodynamically stable.  REVIEW OF SYSTEMS:    Review of Systems  Constitutional: Negative for chills and fever.  HENT: Negative for congestion and tinnitus.   Eyes: Negative for blurred vision and double vision.  Respiratory: Negative for cough, shortness of breath and wheezing.   Cardiovascular: Negative for chest pain, orthopnea and PND.  Gastrointestinal: Negative for abdominal pain, diarrhea, nausea and vomiting.  Genitourinary: Negative for dysuria and hematuria.  Neurological: Negative for dizziness, sensory change and focal weakness.  All other systems reviewed and are negative.   Nutrition: regular Tolerating Diet: Yes Tolerating PT: Await Eval.   DRUG ALLERGIES:   Allergies  Allergen Reactions  . Iohexol     "makes me pass out"    VITALS:  Blood pressure (!) 124/56, pulse 90, temperature 98.2 F (36.8 C), temperature source Oral, resp. rate 17, height 5\' 3"  (1.6 m), weight 87.5 kg, SpO2 96 %.  PHYSICAL EXAMINATION:   Physical Exam  GENERAL:  84 y.o.-year-old patient lying in bed in NAD.   EYES: Pupils equal, round, reactive to light and accommodation. No scleral icterus. Extraocular muscles intact.  HEENT: Head atraumatic, normocephalic. Oropharynx and nasopharynx clear.  NECK:  Supple, no jugular venous distention. No thyroid enlargement, no tenderness.  LUNGS: Poor Resp. effort, no wheezing, rales, rhonchi. No use of accessory muscles of respiration.  CARDIOVASCULAR: S1, S2 normal. No murmurs, rubs, or gallops.  ABDOMEN: Soft, nontender, nondistended. Bowel sounds present. No organomegaly or mass.  EXTREMITIES: No cyanosis, clubbing or edema b/l.    NEUROLOGIC: Cranial nerves II through XII are intact. No  focal Motor or sensory deficits b/l. Globally weak   PSYCHIATRIC: The patient is alert and oriented x 3.  SKIN: No obvious rash, lesion, or ulcer.    LABORATORY PANEL:   CBC Recent Labs  Lab 02/27/18 0450  WBC 17.2*  HGB 10.9*  HCT 34.8*  PLT 250   ------------------------------------------------------------------------------------------------------------------  Chemistries  Recent Labs  Lab 02/25/18 1552  02/27/18 0450  NA 136   < > 137  K 3.0*   < > 4.2  CL 104   < > 109  CO2 23   < > 20*  GLUCOSE 121*   < > 109*  BUN 20   < > 23  CREATININE 1.09*   < > 0.98  CALCIUM 8.4*   < > 8.1*  AST 26  --   --   ALT 16  --   --   ALKPHOS 58  --   --   BILITOT 1.4*  --   --    < > = values in this interval not displayed.   ------------------------------------------------------------------------------------------------------------------  Cardiac Enzymes Recent Labs  Lab 02/27/18 0450  TROPONINI 0.35*   ------------------------------------------------------------------------------------------------------------------  RADIOLOGY:  Ct Head Wo Contrast  Result Date: 02/25/2018 CLINICAL DATA:  84 y/o  F; altered mental status. EXAM: CT HEAD WITHOUT CONTRAST TECHNIQUE: Contiguous axial images were obtained from the base of the skull through the vertex without intravenous contrast. COMPARISON:  None. FINDINGS: Brain: No evidence of acute infarction, hemorrhage, hydrocephalus, extra-axial collection or mass lesion/mass effect. 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 Nonspecific white matter hypodensities are  compatible with mild chronic microvascular ischemic changes for age and there is mild volume loss of the brain. Vascular: Calcific atherosclerosis of the carotid siphons. No hyperdense vessel identified. Skull: Normal. Negative for fracture or focal lesion. Sinuses/Orbits: No acute finding. Other: None. IMPRESSION: 1. No acute  intracranial abnormality identified. 2. Mild chronic microvascular ischemic changes and mild volume loss of the brain for age. 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 Electronically Signed   By: Mitzi Hansen M.D.   On: 02/25/2018 15:04   Mr 3d Recon At Scanner  Result Date: 02/26/2018 CLINICAL DATA:  Left-sided abdominal pain and nausea and vomiting. Biliary ductal dilatation on recent CT. Prior cholecystectomy. EXAM: MRI ABDOMEN WITHOUT AND WITH CONTRAST (INCLUDING MRCP) TECHNIQUE: Multiplanar multisequence MR imaging of the abdomen was performed both before and after the administration of intravenous contrast. Heavily T2-weighted images of the biliary and pancreatic ducts were obtained, and three-dimensional MRCP images were rendered by post processing. CONTRAST:  9 mL Gadavist COMPARISON:  CT on 02/25/2018 FINDINGS: Lower chest: No acute findings. Hepatobiliary: No hepatic masses identified. Prior cholecystectomy noted. Diffuse intra and extrahepatic biliary ductal dilatation is seen, with common bile duct measuring 15 mm. No evidence of choledocholithiasis or biliary stricture. Pancreas: No mass or inflammatory changes. No evidence of pancreatic ductal dilatation or pancreas divisum. Spleen:  Within normal limits in size and appearance. Adrenals/Urinary Tract: No masses identified. No evidence of hydronephrosis. Stomach/Bowel: Visualized portion unremarkable. Vascular/Lymphatic: No pathologically enlarged lymph nodes identified. 3.7 cm infrarenal abdominal aortic aneurysm is stable. Other:  None. Musculoskeletal:  No suspicious bone lesions identified. IMPRESSION: Prior cholecystectomy. Diffuse biliary ductal dilatation, but no choledocholithiasis or other obstructing etiology visualized. Stable 3.7 cm abdominal aortic aneurysm. Recommend followup by ultrasound in 2 years. This recommendation follows ACR consensus guidelines: White Paper of the ACR  Incidental Findings Committee II on Vascular Findings. J Am Coll Radiol 2013; 10:789-794. Electronically Signed   By: Myles Rosenthal M.D.   On: 02/26/2018 01:43   Dg Chest Port 1 View  Result Date: 02/27/2018 CLINICAL DATA:  Cough. EXAM: PORTABLE CHEST 1 VIEW COMPARISON:  Radiograph yesterday. FINDINGS: Unchanged heart size and mediastinal contours. Probable calcified right hilar node. Increased perivascular haziness centrally from prior exam. Increased streaky opacities in the right perihilar lung and left lung base, unchanged right basilar atelectasis. No large pleural effusion. No pneumothorax. IMPRESSION: Increased perivascular haziness centrally from prior exam, may reflect pulmonary edema. Unchanged streaky right infrahilar opacity, now with additional streaky opacities in the right perihilar lung and left lung base, likely atelectasis but aspiration or pneumonia are also considered. Electronically Signed   By: Narda Rutherford M.D.   On: 02/27/2018 02:12   Dg Chest Port 1 View  Result Date: 02/25/2018 CLINICAL DATA:  Sepsis. EXAM: PORTABLE CHEST 1 VIEW COMPARISON:  Radiographs of February 25, 2018. FINDINGS: Stable cardiomediastinal silhouette. Atherosclerosis of thoracic aorta is noted. No pneumothorax pleural effusion is noted. Left lung is unremarkable. Stable calcified granuloma seen in right lower lobe. Mildly increased right basilar opacity is noted which may represent atelectasis or infiltrate. Bony thorax is unremarkable. IMPRESSION: Mildly increased right basilar densities are noted which may represent atelectasis or possibly infiltrate. Aortic Atherosclerosis (ICD10-I70.0). Electronically Signed   By: Lupita Raider, M.D.   On: 02/25/2018 16:10   Mr Abdomen Mrcp Vivien Rossetti Contast  Result Date: 02/26/2018 CLINICAL DATA:  Left-sided abdominal pain and nausea and vomiting. Biliary ductal dilatation on recent CT. Prior cholecystectomy. EXAM:  MRI ABDOMEN WITHOUT AND WITH CONTRAST (INCLUDING MRCP)  TECHNIQUE: Multiplanar multisequence MR imaging of the abdomen was performed both before and after the administration of intravenous contrast. Heavily T2-weighted images of the biliary and pancreatic ducts were obtained, and three-dimensional MRCP images were rendered by post processing. CONTRAST:  9 mL Gadavist COMPARISON:  CT on 02/25/2018 FINDINGS: Lower chest: No acute findings. Hepatobiliary: No hepatic masses identified. Prior cholecystectomy noted. Diffuse intra and extrahepatic biliary ductal dilatation is seen, with common bile duct measuring 15 mm. No evidence of choledocholithiasis or biliary stricture. Pancreas: No mass or inflammatory changes. No evidence of pancreatic ductal dilatation or pancreas divisum. Spleen:  Within normal limits in size and appearance. Adrenals/Urinary Tract: No masses identified. No evidence of hydronephrosis. Stomach/Bowel: Visualized portion unremarkable. Vascular/Lymphatic: No pathologically enlarged lymph nodes identified. 3.7 cm infrarenal abdominal aortic aneurysm is stable. Other:  None. Musculoskeletal:  No suspicious bone lesions identified. IMPRESSION: Prior cholecystectomy. Diffuse biliary ductal dilatation, but no choledocholithiasis or other obstructing etiology visualized. Stable 3.7 cm abdominal aortic aneurysm. Recommend followup by ultrasound in 2 years. This recommendation follows ACR consensus guidelines: White Paper of the ACR Incidental Findings Committee II on Vascular Findings. J Am Coll Radiol 2013; 10:789-794. Electronically Signed   By: Myles Rosenthal M.D.   On: 02/26/2018 01:43     ASSESSMENT AND PLAN:   84 year old female who presents to the hospital due to abdominal pain, nausea and admitted for suspected sepsis.  1.  Sepsis-patient meets criteria given her leukocytosis, tachycardia now having positive blood cultures for Streptococcus. -Source of the sepsis remains unclear.  Initially thought to be a GI source given patient's abdominal pain  nausea and abnormal imaging findings suggestive of common bile duct dilatation, although Patient's MRCP although was negative for any obstruction.  Patient's LFTs are stable bilirubins are stable. - Presently patient is afebrile and hemodynamically stable.  -Continue empiric ceftriaxone, Flagyl for now.  Await further ID input.  - Echo showing no vegetations.   2.  Strep agalactiae bacteremia- source of pt's sepsis.   - source remains unclear.  ?? GI source.  Echo (-) for endocarditis.  - cont. Empiric Ceftriaxone, Flagyl and await further ID input.   3. Leukocytosis - due to # 1&2 - improving with IV abx and pt. Feels better and will cont. To monitor.   4.  Acute kidney injury-secondary to underlying sepsis and relative hypotension. - improved w/ IV fluids and Cr. At baseline now.   5.  Elevated troponin-secondary to supply demand ischemia from some mild CHF. -Patient was given some IV fluids for underlying sepsis and became volume overloaded and was noted to have mild pulmonary edema overnight.  Given 1 dose of IV Lasix and responded well and feels better.,  Will cycle cardiac markers and if trending up we will get cardiology consult.  Echocardiogram shows normal ejection fraction with mild diastolic dysfunction.  6. CHF - acute diastolic dysfunction.  - improved w/ IV lasix and will cont. To monitor.    All the records are reviewed and case discussed with Care Management/Social Worker. Management plans discussed with the patient, family and they are in agreement.  CODE STATUS: Full code  DVT Prophylaxis: Lovenox  TOTAL TIME TAKING CARE OF THIS PATIENT: 30 minutes.   POSSIBLE D/C IN 1-2 DAYS, DEPENDING ON CLINICAL CONDITION.   Houston Siren M.D on 02/27/2018 at 1:27 PM  Between 7am to 6pm - Pager - [phone]  After 6pm go to [email or web address] - password [email or web address]  [employer] [facility]  Office  [phone]  CC: Primary care physician; Patient, No Pcp  Per

## 2018-02-27 NOTE — Evaluation (Signed)
Physical Therapy Evaluation Patient Details Name: Kristen HailstoneBetty S Parfait MRN: [id number] DOB: 09/16/1927 Today's Date: 02/27/2018   History of Present Illness  84 yo female with onset of AMS and atelectasis, encephalopathy, elevated troponin, PNA suspected.  PMHx:  AAA, atherosclerosis, stroke  Clinical Impression  Pt was [facility] for [facility] with incontinence noted.  Helped her get cleaned up from the bed, and then [facility] and standing balance control on RW.  Her plan is to get to SNF with assist for all mobility then return home when it is practical and safe.  Follow acutely for these plans and progress strengthening and gait as able.    Follow Up Recommendations SNF    Equipment Recommendations  Rolling walker with 5" wheels    Recommendations for Other Services       Precautions / Restrictions Precautions Precautions: Fall Restrictions Weight Bearing Restrictions: No      Mobility  Bed Mobility Overal bed mobility: Needs Assistance Bed Mobility: Supine to Sit;Sit to Supine     Supine to sit: Mod assist Sit to supine: Mod assist   General bed mobility comments: pt needs trunk support to get up and leg assist back to bed  Transfers Overall transfer level: Needs assistance Equipment used: Rolling walker (2 wheeled);1 person hand held assist Transfers: Sit to/from Stand Sit to Stand: Mod assist         General transfer comment: mod to power up and cues for hand placemetn  Ambulation/Gait Ambulation/Gait assistance: Min assist Gait Distance (Feet): 12 Feet(5+7) Assistive device: Rolling walker (2 wheeled);1 person hand held assist Gait Pattern/deviations: Step-to pattern;Decreased stride length;Wide base of support Gait velocity: reduced Gait velocity interpretation: <1.8 ft/sec, indicate of risk for recurrent falls General Gait Details: moving from side of bed to step down the bed then to [facility]  Stairs             Wheelchair Mobility    Modified Rankin (Stroke Patients Only)       Balance Overall balance assessment: Needs assistance Sitting-balance support: Bilateral upper extremity supported;Feet supported Sitting balance-Leahy Scale: Fair     Standing balance support: Bilateral upper extremity supported;During functional activity Standing balance-Leahy Scale: Poor                               Pertinent Vitals/Pain Pain Assessment: No/denies pain    Home Living Family/patient expects to be discharged to:: Private residence Living Arrangements: Children Available Help at Discharge: Family Type of Home: House Home Access: Stairs to enter Entrance Stairs-Rails: None Secretary/administratorntrance Stairs-Number of Steps: 2 Home Layout: One level Home Equipment: Environmental consultantWalker - 2 wheels Additional Comments: pt is confused and cannot answer any questions    Prior Function Level of Independence: Independent(per confused pt)               Hand Dominance        Extremity/Trunk Assessment   Upper Extremity Assessment Upper Extremity Assessment: Generalized weakness    Lower Extremity Assessment Lower Extremity Assessment: Generalized weakness    Cervical / Trunk Assessment Cervical / Trunk Assessment: Kyphotic  Communication   Communication: No difficulties  Cognition Arousal/Alertness: Awake/alert Behavior During Therapy: Flat affect;Impulsive Overall Cognitive Status: No family/caregiver present to determine baseline cognitive functioning  General Comments: has AMS but not sure what may be pre-exisiting      General Comments      Exercises     Assessment/Plan    PT Assessment Patient needs continued PT services  PT Problem List Decreased strength;Decreased range of motion;Decreased activity tolerance;Decreased balance;Decreased mobility;Decreased coordination;Decreased cognition;Decreased knowledge of use of DME;Decreased  safety awareness;Obesity       PT Treatment Interventions DME instruction;Gait training;Stair training;Functional mobility training;Therapeutic activities;Therapeutic exercise;Balance training;Neuromuscular re-education;Patient/family education    PT Goals (Current goals can be found in the Care Plan section)  Acute Rehab PT Goals Patient Stated Goal: to walk and get home PT Goal Formulation: With patient Time For Goal Achievement: 03/13/18 Potential to Achieve Goals: Good    Frequency Min 2X/week   Barriers to discharge Inaccessible home environment;Decreased caregiver support home with no clear signs of family assist    Co-evaluation               AM-PAC PT "6 Clicks" Mobility  Outcome Measure Help needed turning from your back to your side while in a flat bed without using bedrails?: A Lot Help needed moving from lying on your back to sitting on the side of a flat bed without using bedrails?: A Lot Help needed moving to and from a bed to a chair (including a wheelchair)?: A Lot Help needed standing up from a chair using your arms (e.g., wheelchair or bedside chair)?: A Lot Help needed to walk in hospital room?: A Lot Help needed climbing 3-5 steps with a railing? : A Lot 6 Click Score: 12    End of Session   Activity Tolerance: Patient tolerated treatment well;Patient limited by fatigue;Patient limited by lethargy Patient left: in bed;with call bell/phone within reach;with bed alarm set;with nursing/sitter in room Nurse Communication: Mobility status PT Visit Diagnosis: Unsteadiness on feet (R26.81);Muscle weakness (generalized) (M62.81);Difficulty in walking, not elsewhere classified (R26.2)    Time: 1610-9604 PT Time Calculation (min) (ACUTE ONLY): 40 min   Charges:   PT Evaluation $PT Eval Moderate Complexity: 1 Mod PT Treatments $Gait Training: 8-22 mins $Therapeutic Activity: 8-22 mins       Ivar Drape 02/27/2018, 1:39 PM   Samul Dada, PT  MS Acute Rehab Dept. Number: [facility] [id number] and [facility] [phone]

## 2018-02-28 ENCOUNTER — Inpatient Hospital Stay: Payer: Self-pay

## 2018-02-28 LAB — CULTURE, BLOOD (ROUTINE X 2)

## 2018-02-28 LAB — GLUCOSE, CAPILLARY: Glucose-Capillary: 126 mg/dL — ABNORMAL HIGH (ref 70–99)

## 2018-02-28 LAB — CBC
HCT: 33.6 % — ABNORMAL LOW (ref 36.0–46.0)
Hemoglobin: 11 g/dL — ABNORMAL LOW (ref 12.0–15.0)
MCH: 30 pg (ref 26.0–34.0)
MCHC: 32.7 g/dL (ref 30.0–36.0)
MCV: 91.6 fL (ref 80.0–100.0)
Platelets: 242 10*3/uL (ref 150–400)
RBC: 3.67 MIL/uL — ABNORMAL LOW (ref 3.87–5.11)
RDW: 14.6 % (ref 11.5–15.5)
WBC: 10.1 10*3/uL (ref 4.0–10.5)
nRBC: 0 % (ref 0.0–0.2)

## 2018-02-28 MED ORDER — ALUM & MAG HYDROXIDE-SIMETH 200-200-20 MG/5ML PO SUSP: 30.0000 mL | Freq: Four times a day (QID) | ORAL | Status: DC | PRN

## 2018-02-28 MED ORDER — SODIUM CHLORIDE 0.9% FLUSH
10.0000 mL | Freq: Two times a day (BID) | INTRAVENOUS | Status: DC
  Administered 2018-02-28 – 2018-03-01 (×2): 10 mL

## 2018-02-28 MED ORDER — SODIUM CHLORIDE 0.9% FLUSH
10.0000 mL | INTRAVENOUS | Status: DC | PRN
  Administered 2018-02-28: 22:00:00 10 mL
  Filled 2018-02-28: qty 40

## 2018-02-28 NOTE — Progress Notes (Signed)
[employer] - [place] at [facility]      PATIENT NAME: Kristen Dudley    MR#:  [id number]  DATE OF BIRTH:  July 26, 1927  SUBJECTIVE:   No acute events overnight.  Feels much better. Afebrile, WBC count has normalized.   REVIEW OF SYSTEMS:    Review of Systems  Constitutional: Negative for chills and fever.  HENT: Negative for congestion and tinnitus.   Eyes: Negative for blurred vision and double vision.  Respiratory: Negative for cough, shortness of breath and wheezing.   Cardiovascular: Negative for chest pain, orthopnea and PND.  Gastrointestinal: Negative for abdominal pain, diarrhea, nausea and vomiting.  Genitourinary: Negative for dysuria and hematuria.  Neurological: Negative for dizziness, sensory change and focal weakness.  All other systems reviewed and are negative.   Nutrition: regular Tolerating Diet: Yes Tolerating PT: Eval noted.   DRUG ALLERGIES:   Allergies  Allergen Reactions  . Iohexol     "makes me pass out"    VITALS:  Blood pressure (!) 111/58, pulse 73, temperature 98 F (36.7 C), temperature source Oral, resp. rate 16, height 5\' 3"  (1.6 m), weight 87.5 kg, SpO2 95 %.  PHYSICAL EXAMINATION:   Physical Exam  GENERAL:  84 y.o.-year-old patient lying in bed in NAD.   EYES: Pupils equal, round, reactive to light and accommodation. No scleral icterus. Extraocular muscles intact.  HEENT: Head atraumatic, normocephalic. Oropharynx and nasopharynx clear.  NECK:  Supple, no jugular venous distention. No thyroid enlargement, no tenderness.  LUNGS: Poor Resp. effort, no wheezing, rales, rhonchi. No use of accessory muscles of respiration.  CARDIOVASCULAR: S1, S2 normal. No murmurs, rubs, or gallops.  ABDOMEN: Soft, nontender, nondistended. Bowel sounds present. No organomegaly or mass.  EXTREMITIES: No cyanosis, clubbing or edema b/l.    NEUROLOGIC: Cranial nerves II through XII are intact. No focal Motor or sensory deficits b/l.  Globally weak   PSYCHIATRIC: The patient is alert and oriented x 3.  SKIN: No obvious rash, lesion, or ulcer.    LABORATORY PANEL:   CBC Recent Labs  Lab 02/28/18 0601  WBC 10.1  HGB 11.0*  HCT 33.6*  PLT 242   ------------------------------------------------------------------------------------------------------------------  Chemistries  Recent Labs  Lab 02/27/18 0450 02/27/18 1758  NA 137  --   K 4.2  --   CL 109  --   CO2 20*  --   GLUCOSE 109*  --   BUN 23  --   CREATININE 0.98  --   CALCIUM 8.1*  --   AST  --  31  ALT  --  19  ALKPHOS  --  57  BILITOT  --  0.8   ------------------------------------------------------------------------------------------------------------------  Cardiac Enzymes Recent Labs  Lab 02/27/18 1758  TROPONINI 0.26*   ------------------------------------------------------------------------------------------------------------------  RADIOLOGY:  Dg Chest Port 1 View  Result Date: 02/27/2018 CLINICAL DATA:  Cough. EXAM: PORTABLE CHEST 1 VIEW COMPARISON:  Radiograph yesterday. FINDINGS: Unchanged heart size and mediastinal contours. Probable calcified right hilar node. Increased perivascular haziness centrally from prior exam. Increased streaky opacities in the right perihilar lung and left lung base, unchanged right basilar atelectasis. No large pleural effusion. No pneumothorax. IMPRESSION: Increased perivascular haziness centrally from prior exam, may reflect pulmonary edema. Unchanged streaky right infrahilar opacity, now with additional streaky opacities in the right perihilar lung and left lung base, likely atelectasis but aspiration or pneumonia are also considered. Electronically Signed   By: Narda Rutherford M.D.   On: 02/27/2018 02:12     ASSESSMENT AND  PLAN:   84 year old female who presents to the hospital due to abdominal pain, nausea and admitted for suspected sepsis.  1.  Sepsis-patient meets criteria given her  leukocytosis, tachycardia now having positive blood cultures for Streptococcus. -Source of the sepsis remains unclear.  Initially thought to be a GI source given patient's abdominal pain nausea and abnormal imaging findings suggestive of common bile duct dilatation, although Patient's MRCP although was negative for any obstruction.  Patient's LFTs, bilirubins are stable. - Presently patient is afebrile and hemodynamically stable.  -Continue empiric ceftriaxone, Flagyl for now.  Appreciate ID input and patient will likely need 10 to 14 days of IV antibiotics.  Repeat blood cultures remain negative.  Echocardiogram was negative for vegetations.  2.  Strep agalactiae bacteremia- source of pt's sepsis.   - source remains unclear.  Likely GI source.  Echo (-) for endocarditis.  - cont. Empiric Ceftriaxone, Flagyl and as infectious disease patient will need 10 to 14 days of IV antibiotics.  Will probably need to place a PICC line.  3. Leukocytosis - due to # 1&2 - improved and normalized with IV abx.    4.  Acute kidney injury-secondary to underlying sepsis and relative hypotension. - improved w/ IV fluids and Cr. At baseline now.   5.  Elevated troponin-secondary to supply demand ischemia from some mild CHF. - Trop did not trend up. Will d/c tele.  She is clinically asymptomatic, echocardiogram showed normal ejection fraction with no wall motion abnormalities.  6. CHF - acute diastolic dysfunction.  - improved w/ IV lasix and clinically stable now.   All the records are reviewed and case discussed with Care Management/Social Worker. Management plans discussed with the patient, family and they are in agreement.  CODE STATUS: Full code  DVT Prophylaxis: Lovenox  TOTAL TIME TAKING CARE OF THIS PATIENT: 30 minutes.   POSSIBLE D/C IN 1-2 DAYS, DEPENDING ON CLINICAL CONDITION.   Houston SirenSAINANI,Kristen Dudley J M.D on 02/28/2018 at 2:06 PM  Between 7am to 6pm - Pager - [phone]  After 6pm go to  [email or web address] - Social research officer, governmentpassword [email or web address] [email or web address]  [employer]  Hospitalists  Office  [phone]  CC: Primary care physician; Patient, No Pcp Per

## 2018-02-28 NOTE — Progress Notes (Signed)
PICC to be placed tomorrow per IV Team. Bo McclintockBrewer,Viha Kriegel S, RN

## 2018-02-28 NOTE — Clinical Social Work Note (Signed)
Clinical Social Work Assessment  Patient Details  Name: Kristen Dudley MRN: [id number] Date of Birth: [birth year]  Date of referral:  02/28/18               Reason for consult:  Facility Placement                Permission sought to share information with:  Facility Art therapist granted to share information::  No  Name::        Agency::     Relationship::     Contact Information:     Housing/Transportation Living arrangements for the past 2 months:  Single Family Home Source of Information:  Patient, Medical Team Patient Interpreter Needed:  None Criminal Activity/Legal Involvement Pertinent to Current Situation/Hospitalization:  No - Comment as needed Significant Relationships:  Adult Children, Community Support, Sylva Lives with:  Adult Children Do you feel safe going back to the place where you live?  Yes Need for family participation in patient care:  No (Coment)  Care giving concerns:  PT recommendation for SNF   Social Worker assessment / plan:  The CSW became aware in chart review that PT recommended SNF for the patient. The CSW met with the patient at bedside to discuss discharge planning. The patient was alert and oriented X4, and she shared that she does not want to discharge to SNF due to past family experiences. The patient lives with her son, Shanon Brow, in a 1-story home, and she feels well cared for. The CSW has updated the [facility] that the patient would benefit from home health services. CSW is signing off. Please consult should needs arise.  Employment status:  Retired Nurse, adult PT Recommendations:  [place] / Referral to community resources:     Patient/Family's Response to care:  The patient was pleasant and thanked the CSW.  Patient/Family's Understanding of and Emotional Response to Diagnosis, Current Treatment, and Prognosis:  The patient seems to understand the discharge options and is  making an informed decision.  Emotional Assessment Appearance:  Appears stated age Attitude/Demeanor/Rapport:  Engaged, Self-Confident, Charismatic, Gracious Affect (typically observed):  Appropriate, Pleasant Orientation:  Oriented to Self, Oriented to Place, Oriented to  Time, Oriented to Situation Alcohol / Substance use:  Never Used Psych involvement (Current and /or in the community):  No (Comment)  Discharge Needs  Concerns to be addressed:  Care Coordination, Discharge Planning Concerns Readmission within the last 30 days:  No Current discharge risk:  Chronically ill Barriers to Discharge:  Continued Medical Work up   [employer], LCSW 02/28/2018, 3:43 PM

## 2018-02-28 NOTE — Progress Notes (Signed)
Peripherally Inserted Central Catheter/Midline Placement  The IV Nurse has discussed with the patient and/or persons authorized to consent for the patient, the purpose of this procedure and the potential benefits and risks involved with this procedure.  The benefits include less needle sticks, lab draws from the catheter, and the patient may be discharged home with the catheter. Risks include, but not limited to, infection, bleeding, blood clot (thrombus formation), and puncture of an artery; nerve damage and irregular heartbeat and possibility to perform a PICC exchange if needed/ordered by physician.  Alternatives to this procedure were also discussed.  Bard Power PICC patient education guide, fact sheet on infection prevention and patient information card has been provided to patient /or left at bedside.    PICC/Midline Placement Documentation  PICC Single Lumen 02/28/18 PICC Right Basilic 37 cm 0 cm (Active)  Indication for Insertion or Continuance of Line Home intravenous therapies (PICC only) 02/28/2018  6:00 PM  Exposed Catheter (cm) 0 cm 02/28/2018  6:00 PM  Site Assessment Clean;Dry;Intact 02/28/2018  6:00 PM  Line Status Flushed;Blood return noted 02/28/2018  6:00 PM  Dressing Type Transparent 02/28/2018  6:00 PM  Dressing Status Clean;Dry;Intact;Antimicrobial disc in place 02/28/2018  6:00 PM  Dressing Intervention New dressing 02/28/2018  6:00 PM  Dressing Change Due 03/07/18 02/28/2018  6:00 PM       Stacie GlazeJoyce, Tyechia Allmendinger Horton 02/28/2018, 6:40 PM

## 2018-03-01 MED ORDER — CLOPIDOGREL BISULFATE 75 MG PO TABS: 75.0000 mg | ORAL_TABLET | Freq: Every day | ORAL | 0 refills | Status: DC

## 2018-03-01 MED ORDER — SODIUM CHLORIDE 0.9 % IV SOLN: 2.0000 g | INTRAVENOUS | Status: AC

## 2018-03-01 MED ORDER — CLOPIDOGREL BISULFATE 75 MG PO TABS: 75.0000 mg | ORAL_TABLET | Freq: Every day | ORAL | 0 refills | Status: AC

## 2018-03-01 NOTE — Care Management Note (Signed)
Case Management Note  Patient Details  Name: Kristen Dudley MRN: [id number] Date of Birth: 01/21/1928  Subjective/Objective:  Patient to be discharged per MD order. Orders in place for home health services. Patient has no preference of agency and since we have to use abx we will use [facility], patient in agreement. Notified Brad of referral and we obtain IV abx and RW, start date for IV abx tomorrow afternoon. Patient to follow up with ID. Son to transport and assist with abx                    Action/Plan:   Expected Discharge Date:  03/01/18               Expected Discharge Plan:  Home w Home Health Services  In-House Referral:     Discharge planning Services  CM Consult  Post Acute Care Choice:  Durable Medical Equipment, Home Health Choice offered to:  Patient, Adult Children  DME Arranged:  IV pump/equipment, Walker rolling DME Agency:  [facility]  [facility] Arr[facility]  [facility]  Status of Service:  Completed, signed off  If discussed at Long Length of Stay Meetings, dates discussed:    Additional Comments:  Virgel ManifoldJosh A Joseh Sjogren, RN 03/01/2018, 1:18 PM

## 2018-03-02 LAB — CULTURE, BLOOD (ROUTINE X 2)
CULTURE: NO GROWTH
CULTURE: NO GROWTH
SPECIAL REQUESTS: ADEQUATE

## 2018-03-02 NOTE — Discharge Summary (Signed)
[facility] NAME: Kristen Dudley    MR#:  [id number]  DATE OF BIRTH:  20-Sep-1927  DATE OF ADMISSION:  02/25/2018 ADMITTING PHYSICIAN: Gorden Harms, MD  DATE OF DISCHARGE: 03/01/2018  1:18 PM  PRIMARY CARE PHYSICIAN: System, Pcp Not In    ADMISSION DIAGNOSIS:  Sepsis, due to unspecified organism, unspecified whether acute organ dysfunction present ([place]) [A41.9]  DISCHARGE DIAGNOSIS:  Active Problems:   Sepsis ([place])   SECONDARY DIAGNOSIS:  History reviewed. No pertinent past medical history.  HOSPITAL COURSE:   84 year old female who presents to the hospital due to abdominal pain, nausea and admitted for suspected sepsis.  1.  Sepsis-patient met criteria on admission given her leukocytosis, tachycardia  And having positive blood cultures for Streptococcus. - The source of patient's sepsis remains unclear was thought to be secondary to a GI source as patient had some abdominal pain nausea and abnormal imaging findings suggestive of common bile duct dilatation. -Patient was empirically treated with IV ceftriaxone, Flagyl empirically.  Infectious disease consult was obtained. - Patient underwent an echocardiogram which showed no evidence of vegetations, after getting IV antibiotics patient improved.  Infectious disease recommended discharge on IV antibiotics with ceftriaxone for additional 10 days.  Patient had a PICC line placed and was discharged on IV ceftriaxone daily.  She remains afebrile and hemodynamically stable and her repeat blood cultures are negative.  2.  Strep agalactiae bacteremia- source of pt's sepsis.   -The source of the bacteremia remains unclear but was suspected to be secondary to a GI source.  As mentioned above patient had abdominal pain and nausea with imaging findings suggestive of common bile duct dilatation but her MRCP showed no evidence of obstruction. - Patient was empirically treated with IV  ceftriaxone, Flagyl, repeat blood cultures were negative.  Echocardiogram was negative for endocarditis.  Patient was seen by infectious disease who recommended long-term IV antibiotics for 10 to 14 days.  Patient went had a PICC line placed and was discharged with home health services and antibiotics for additional 9 days.  3. Leukocytosis - due to # 1&2 -This is improved and resolved with IV antibiotics.   4.  Acute kidney injury-secondary to underlying sepsis and relative hypotension. - improved w/ IV fluids and Cr. At baseline now.   5.  Elevated troponin- this was secondary to supply demand ischemia from some mild CHF. -Patient went to some mild CHF due to volume resuscitation.  She received 1 dose of IV Lasix and responded well to it.  Troponins level did not trend upwards.  She was clinically asymptomatic, her echocardiogram showed normal ejection fraction with no wall motion abnormalities.  6. CHF - acute diastolic dysfunction.  -Improved and resolved with transient diuretics.  Echocardiogram showed normal ejection fraction.  She was normotensive and therefore was not discharged on any scheduled medications.   DISCHARGE CONDITIONS:   Stable.   CONSULTS OBTAINED:  Treatment Team:  Tsosie Billing, MD  DRUG ALLERGIES:   Allergies  Allergen Reactions  . Iohexol     "makes me pass out"    DISCHARGE MEDICATIONS:   Allergies as of 03/01/2018      Reactions   Iohexol    "makes me pass out"      Medication List    TAKE these medications   aspirin EC 81 MG tablet Take 81 mg by mouth daily.   cefTRIAXone 2 g in sodium chloride 0.9 % 100 mL Inject 2  g into the vein daily for 9 days.   clopidogrel 75 MG tablet Commonly known as:  PLAVIX Take 1 tablet (75 mg total) by mouth daily.         DISCHARGE INSTRUCTIONS:   DIET:  Regular diet  DISCHARGE CONDITION:  Stable  ACTIVITY:  Activity as tolerated  OXYGEN:  Home Oxygen: No.   Oxygen Delivery:  room air  DISCHARGE LOCATION:  Home with Home Health PT, RN.    If you experience worsening of your admission symptoms, develop shortness of breath, life threatening emergency, suicidal or homicidal thoughts you must seek medical attention immediately by calling 911 or calling your MD immediately  if symptoms less severe.  You Must read complete instructions/literature along with all the possible adverse reactions/side effects for all the Medicines you take and that have been prescribed to you. Take any new Medicines after you have completely understood and accpet all the possible adverse reactions/side effects.   Please note  You were cared for by a hospitalist during your hospital stay. If you have any questions about your discharge medications or the care you received while you were in the hospital after you are discharged, you can call the unit and asked to speak with the hospitalist on call if the hospitalist that took care of you is not available. Once you are discharged, your primary care physician will handle any further medical issues. Please note that NO REFILLS for any discharge medications will be authorized once you are discharged, as it is imperative that you return to your primary care physician (or establish a relationship with a primary care physician if you do not have one) for your aftercare needs so that they can reassess your need for medications and monitor your lab values.     Today   No acute events overnight, afebrile, hemodynamically stable.  White cell count normalized.  Status post PICC line placement.  Will discharge home with home health services and IV antibiotics.  VITAL SIGNS:  Blood pressure (!) 129/48, pulse 81, temperature 98.1 F (36.7 C), temperature source Oral, resp. rate 16, height 5' 3"  (1.6 m), weight 87.5 kg, SpO2 90 %.  I/O:  No intake or output data in the 24 hours ending 03/02/18 1516  PHYSICAL EXAMINATION:   GENERAL:  84 y.o.-year-old  patient lying in bed in NAD.   EYES: Pupils equal, round, reactive to light and accommodation. No scleral icterus. Extraocular muscles intact.  HEENT: Head atraumatic, normocephalic. Oropharynx and nasopharynx clear.  NECK:  Supple, no jugular venous distention. No thyroid enlargement, no tenderness.  LUNGS: Poor Resp. effort, no wheezing, rales, rhonchi. No use of accessory muscles of respiration.  CARDIOVASCULAR: S1, S2 normal. No murmurs, rubs, or gallops.  ABDOMEN: Soft, nontender, nondistended. Bowel sounds present. No organomegaly or mass.  EXTREMITIES: No cyanosis, clubbing or edema b/l.    NEUROLOGIC: Cranial nerves II through XII are intact. No focal Motor or sensory deficits b/l. Globally weak   PSYCHIATRIC: The patient is alert and oriented x 3.  SKIN: No obvious rash, lesion, or ulcer.   DATA REVIEW:   CBC Recent Labs  Lab 02/28/18 0601  WBC 10.1  HGB 11.0*  HCT 33.6*  PLT 242    Chemistries  Recent Labs  Lab 02/27/18 0450 02/27/18 1758  NA 137  --   K 4.2  --   CL 109  --   CO2 20*  --   GLUCOSE 109*  --   BUN 23  --  CREATININE 0.98  --   CALCIUM 8.1*  --   AST  --  31  ALT  --  19  ALKPHOS  --  57  BILITOT  --  0.8    Cardiac Enzymes Recent Labs  Lab 02/27/18 1758  TROPONINI 0.26*    Microbiology Results  Results for orders placed or performed during the hospital encounter of 02/25/18  Culture, blood (Routine x 2)     Status: Abnormal   Collection Time: 02/25/18 10:59 AM  Result Value Ref Range Status   Specimen Description   Final    BLOOD RIGHT FA Performed at [facility], [address], [place], [place] [address]    Special Requests   Final    BOTTLES DRAWN AEROBIC AND ANAEROBIC Blood Culture results may not be optimal due to an excessive volume of blood received in culture bottles Performed at [facility], [address] [place], [place] [address]    Culture  Setup Time   Final    Organism ID to follow [place] CRITICAL RESULT CALLED TO, READ BACK BY AND VERIFIED WITH: MATT [facility] 02/25/18 2200 REC Performed at [facility], [address] [place], [place] [address]    Culture GROUP B STREP(S.AGALACTIAE)ISOLATED (A)  Final   Report Status 02/28/2018 FINAL  Final   Organism ID, Bacteria GROUP B STREP(S.AGALACTIAE)ISOLATED  Final      Susceptibility   Group b strep(s.agalactiae)isolated - MIC*    CLINDAMYCIN >=1 RESISTANT Resistant     AMPICILLIN <=0.25 SENSITIVE Sensitive     ERYTHROMYCIN >=8 RESISTANT Resistant     VANCOMYCIN 0.5 SENSITIVE Sensitive     CEFTRIAXONE <=0.12 SENSITIVE Sensitive     LEVOFLOXACIN 0.5 SENSITIVE Sensitive     * GROUP B STREP(S.AGALACTIAE)ISOLATED  Urine culture     Status: None   Collection Time: 02/25/18 10:59 AM  Result Value Ref Range Status   Specimen Description   Final    URINE, RANDOM Performed at [facility], [address], [place], [place] [address]    Special Requests   Final    NONE Performed at [facility], [address], [place], [place] [address]    Culture   Final    NO GROWTH Performed at [facility], [place] [address], [place], [place] [address]    Report Status 02/26/2018 FINAL  Final  Blood Culture ID Panel (Reflexed)     Status: Abnormal   Collection Time: 02/25/18 10:59 AM  Result Value Ref Range Status   Enterococcus species NOT DETECTED NOT DETECTED Final   Listeria monocytogenes NOT DETECTED NOT DETECTED Final   Staphylococcus species NOT DETECTED NOT DETECTED Final   Staphylococcus aureus (BCID) NOT DETECTED NOT DETECTED Final   Streptococcus species DETECTED (A) NOT DETECTED Final    Comment: CRITICAL RESULT CALLED TO, READ BACK BY AND VERIFIED WITH: MATT [facility] 02/25/18 2200 REC    Streptococcus agalactiae DETECTED (A) NOT DETECTED Final    Comment: CRITICAL RESULT CALLED TO, READ BACK BY AND VERIFIED WITH: MATT [facility] 02/25/18 2200  REC    Streptococcus pneumoniae NOT DETECTED NOT DETECTED Final   Streptococcus pyogenes NOT DETECTED NOT DETECTED Final   Acinetobacter baumannii NOT DETECTED NOT DETECTED Final   Enterobacteriaceae species NOT DETECTED NOT DETECTED Final   Enterobacter cloacae complex NOT DETECTED NOT DETECTED Final   Escherichia coli NOT DETECTED NOT DETECTED Final   Klebsiella oxytoca NOT DETECTED NOT  DETECTED Final   Klebsiella pneumoniae NOT DETECTED NOT DETECTED Final   Proteus species NOT DETECTED NOT DETECTED Final   Serratia marcescens NOT DETECTED NOT DETECTED Final   Haemophilus influenzae NOT DETECTED NOT DETECTED Final   Neisseria meningitidis NOT DETECTED NOT DETECTED Final   Pseudomonas aeruginosa NOT DETECTED NOT DETECTED Final   Candida albicans NOT DETECTED NOT DETECTED Final   Candida glabrata NOT DETECTED NOT DETECTED Final   Candida krusei NOT DETECTED NOT DETECTED Final   Candida parapsilosis NOT DETECTED NOT DETECTED Final   Candida tropicalis NOT DETECTED NOT DETECTED Final    Comment: Performed at [facility], [address] [place], [place] [address]  Culture, blood (Routine x 2)     Status: Abnormal   Collection Time: 02/25/18 11:00 AM  Result Value Ref Range Status   Specimen Description   Final    BLOOD LEFT FA Performed at [facility], [address], [place], [place] [address]    Special Requests   Final    BOTTLES DRAWN AEROBIC AND ANAEROBIC Blood Culture results may not be optimal due to an excessive volume of blood received in culture bottles Performed at [facility], [address] [place], [place] [address]    Culture  Setup Time   Final    GRAM POSITIVE COCCI AEROBIC BOTTLE ONLY CRITICAL VALUE NOTED.  VALUE IS CONSISTENT WITH PREVIOUSLY REPORTED AND CALLED VALUE. Performed at [facility], [address] [place], [place] [address]    Culture (A)  Final    GROUP B STREP(S.AGALACTIAE)ISOLATED SUSCEPTIBILITIES  PERFORMED ON PREVIOUS CULTURE WITHIN THE LAST 5 DAYS. Performed at [facility], [place] [address] [place], [place] [address]    Report Status 02/28/2018 FINAL  Final  Gastrointestinal Panel by PCR , Stool     Status: None   Collection Time: 02/25/18 11:00 AM  Result Value Ref Range Status   Campylobacter species NOT DETECTED NOT DETECTED Final   Plesimonas shigelloides NOT DETECTED NOT DETECTED Final   Salmonella species NOT DETECTED NOT DETECTED Final   Yersinia enterocolitica NOT DETECTED NOT DETECTED Final   Vibrio species NOT DETECTED NOT DETECTED Final   Vibrio cholerae NOT DETECTED NOT DETECTED Final   Enteroaggregative E coli (EAEC) NOT DETECTED NOT DETECTED Final   Enteropathogenic E coli (EPEC) NOT DETECTED NOT DETECTED Final   Enterotoxigenic E coli (ETEC) NOT DETECTED NOT DETECTED Final   Shiga like toxin producing E coli (STEC) NOT DETECTED NOT DETECTED Final   Shigella/Enteroinvasive E coli (EIEC) NOT DETECTED NOT DETECTED Final   Cryptosporidium NOT DETECTED NOT DETECTED Final   Cyclospora cayetanensis NOT DETECTED NOT DETECTED Final   Entamoeba histolytica NOT DETECTED NOT DETECTED Final   Giardia lamblia NOT DETECTED NOT DETECTED Final   Adenovirus F40/41 NOT DETECTED NOT DETECTED Final   Astrovirus NOT DETECTED NOT DETECTED Final   Norovirus GI/GII NOT DETECTED NOT DETECTED Final   Rotavirus A NOT DETECTED NOT DETECTED Final   Sapovirus (I, II, IV, and V) NOT DETECTED NOT DETECTED Final    Comment: Performed at [facility], [address] [place],  [address]  C difficile quick scan w PCR reflex     Status: None   Collection Time: 02/25/18 11:00 AM  Result Value Ref Range Status   C Diff antigen NEGATIVE NEGATIVE Final   C Diff toxin NEGATIVE NEGATIVE Final   C Diff interpretation No C. difficile detected.  Final    Comment: Performed at  [facility], [address] [place], [place] [address]  Culture, blood (x 2)     Status: None    Collection Time: 02/25/18  4:03 PM  Result Value Ref Range Status   Specimen Description BLOOD RIGHT ANTECUBITAL  Final   Special Requests   Final    BOTTLES DRAWN AEROBIC AND ANAEROBIC Blood Culture results may not be optimal due to an excessive volume of blood received in culture bottles   Culture   Final    NO GROWTH 5 DAYS Performed at [facility] [address] [place], [place] [address]    Report Status 03/02/2018 FINAL  Final  Culture, blood (x 2)     Status: None   Collection Time: 02/25/18  4:15 PM  Result Value Ref Range Status   Specimen Description BLOOD BLOOD RIGHT HAND  Final   Special Requests   Final    BOTTLES DRAWN AEROBIC AND ANAEROBIC Blood Culture adequate volume   Culture   Final    NO GROWTH 5 DAYS Performed at [facility], [address] [place], [place] [address]    Report Status 03/02/2018 FINAL  Final    RADIOLOGY:  No results found.    Management plans discussed with the patient, family and they are in agreement.  CODE STATUS:  Code Status History    Date Active Date Inactive Code Status Order ID Comments User Context   02/25/2018 1547 03/01/2018 1624 Full Code [id number]  Salary, Avel Peace, MD Inpatient     TOTAL TIME TAKING CARE OF THIS PATIENT: 40 minutes.    Henreitta Leber M.D on 03/02/2018 at 3:16 PM  Between 7am to 6pm - Pager - [phone]  After 6pm go to [email or web address] - Proofreader  [facility]  Office  [phone]  CC: Primary care physician; System, Pcp Not In

## 2018-03-05 ENCOUNTER — Telehealth: Payer: Self-pay

## 2018-03-05 ENCOUNTER — Telehealth: Payer: Self-pay | Admitting: Infectious Diseases

## 2018-03-05 NOTE — Telephone Encounter (Signed)
Patient would like to be seen for follow up sooner than 12/23. Please advise patient if it is okay to wait until then or not.

## 2018-03-05 NOTE — Telephone Encounter (Signed)
EMMI Follow-up: Noted on the report that the patient didn't have a scheduled follow-up appointment yet. I talked with Ms. Kristen Dudley and her son Onalee Hua(David) and he had left a message at the doctor's office to get an appointment scheduled.  I let them know there would be another automated call with a different series of questions and to let us know if they had any concerns.  No needs noted for today.

## 2018-03-09 ENCOUNTER — Telehealth: Payer: Self-pay

## 2018-03-09 NOTE — Telephone Encounter (Signed)
Patient will be ending IV abx on tomorrow 12/10, labs were drawn by [facility] today. She was given a f/u appointment with Dr. Rivka Saferavishankar for 12/23 and she will not be able to make it. Patient is wondering if she could be seen sooner or can she wait after the holidays. Patient's son says she is feeling better and doing well. Please advise

## 2018-03-09 NOTE — Telephone Encounter (Signed)
EMMI Follow up, Spoke to the Patient's son Salvatore MarvelDavid Breunig.  He stated that the Dr. Isidore Moosffice did call to schedule the appointment, he was busy and has to call them back.  He plans to do so today.  [facility] is at the home now doing a dressing change for the IV ABX. No further needs at this time. DG RN

## 2018-03-10 NOTE — Telephone Encounter (Signed)
Sounds good. She had Grp B strep bacteremia so if doing well can follow up with her PCP and no need to see ID

## 2018-03-10 NOTE — Telephone Encounter (Signed)
I can see this week if needed. Prefer around 1130 am so I can round some before hand.

## 2018-03-10 NOTE — Telephone Encounter (Signed)
Spoke with patient's son Onalee HuaDavid, he said she is doing great and just finished her last dose of antibiotics and will have her PICC removed today. I asked him to call us if anything changes.

## 2018-03-17 ENCOUNTER — Ambulatory Visit
Admission: RE | Admit: 2018-03-17 | Discharge: 2018-03-17 | Disposition: A | Discharge status: Home / Self Care | Payer: Medicare Other | Source: Ambulatory Visit | Attending: Family Medicine | Admitting: Family Medicine

## 2018-03-17 ENCOUNTER — Ambulatory Visit: Payer: Medicare Other | Admitting: Family Medicine

## 2018-03-17 ENCOUNTER — Ambulatory Visit
Admission: RE | Admit: 2018-03-17 | Discharge: 2018-03-17 | Disposition: A | Discharge status: Home / Self Care | Payer: Medicare Other | Attending: Family Medicine | Admitting: Family Medicine

## 2018-03-17 ENCOUNTER — Encounter: Payer: Self-pay | Admitting: Family Medicine

## 2018-03-17 VITALS — BP 120/56 | HR 95 | Temp 97.5°F | Resp 16 | Ht 63.0 in | Wt 165.3 lb

## 2018-03-17 DIAGNOSIS — R0989 Other specified symptoms and signs involving the circulatory and respiratory systems: Secondary | ICD-10-CM | POA: Diagnosis present

## 2018-03-17 DIAGNOSIS — Z23 Encounter for immunization: Secondary | ICD-10-CM

## 2018-03-17 DIAGNOSIS — I739 Peripheral vascular disease, unspecified: Secondary | ICD-10-CM | POA: Insufficient documentation

## 2018-03-17 DIAGNOSIS — R944 Abnormal results of kidney function studies: Secondary | ICD-10-CM

## 2018-03-17 DIAGNOSIS — Z8781 Personal history of (healed) traumatic fracture: Secondary | ICD-10-CM | POA: Insufficient documentation

## 2018-03-17 DIAGNOSIS — R300 Dysuria: Secondary | ICD-10-CM

## 2018-03-17 DIAGNOSIS — A401 Sepsis due to streptococcus, group B: Secondary | ICD-10-CM

## 2018-03-17 DIAGNOSIS — R6520 Severe sepsis without septic shock: Secondary | ICD-10-CM

## 2018-03-17 LAB — POCT URINALYSIS DIPSTICK
Appearance: NORMAL
Bilirubin, UA: NEGATIVE
GLUCOSE UA: NEGATIVE
Ketones, UA: NEGATIVE
Nitrite, UA: NEGATIVE
Odor: NORMAL
Protein, UA: NEGATIVE
Spec Grav, UA: 1.025 (ref 1.010–1.025)
Urobilinogen, UA: 0.2 E.U./dL
pH, UA: 6 (ref 5.0–8.0)

## 2018-03-17 MED ORDER — AMOXICILLIN-POT CLAVULANATE 875-125 MG PO TABS: 1.0000 | ORAL_TABLET | Freq: Two times a day (BID) | ORAL | 0 refills | Status: DC

## 2018-03-17 NOTE — Progress Notes (Signed)
Name: Kristen Dudley   MRN: [id number]    DOB: [birth year]   Date:03/17/2018       Progress Note  Subjective  Chief Complaint  Chief Complaint  Patient presents with  . Dysuria    started yesterday, has pain when she urinates, no treatment. Increased water intake.    HPI  Dysuria: she states she has been feeling well, no fever or chills, but since yesterday she developed dysuria, frequency, urgency , denies abdominal pain, no hematuria. She states she has responded in the past to Augmentin, her kidney function dropped while at [facility] for recent sepsis and we will avoid Septra today.   PVD: she moved here from [place] and needs to see local vascular surgeon, she had a stent in the past, has claudication still but resolves when she rests. We will check lipid panel, currently on Plavix and aspirin.    Sepsis: recently admitted because of chills and fever, it was group B strep and per patient they thought secondary to a open wound on right toe( she had removed a piece of her skin). She did not have to see ID after hospital stay, her WBC was over 17 k and we will repeat labs today.   Hearing loss: she has intermittent eat pain, she forgot to wear hearing aids today. No fever or chills.    Patient Active Problem List   Diagnosis Date Noted  . PVD (peripheral vascular disease) (HCC) 03/17/2018  . History of femur fracture 03/17/2018    Past Surgical History:  Procedure Laterality Date  . ABDOMINAL HYSTERECTOMY    . CATARACT EXTRACTION, BILATERAL  2007  . CHOLECYSTECTOMY OPEN  1961  . IR ERCP EXCH REM STENTS BIL PAN DUCT INC DIL EA STENT EXCH Right 2003   leg stents  . ORIF FEMUR FRACTURE Right 2014    Family History  Problem Relation Age of Onset  . Breast cancer Mother   . Heart disease Father     Social History   Socioeconomic History  . Marital status: Widowed    Spouse name: Not on file  . Number of children: 2  . Years of education: Not on file  . Highest education level:  Some college, no degree  Occupational History  . Occupation: retired     Comment: Diplomatic Services operational officer   Social Needs  . Financial resource strain: Not hard at all  . Food insecurity:    Worry: Never true    Inability: Never true  . Transportation needs:    Medical: Not on file    Non-medical: No  Tobacco Use  . Smoking status: Former Smoker    Packs/day: 0.50    Years: 50.00    Pack years: 25.00    Last attempt to quit: 05/01/2001    Years since quitting: 16.8  . Smokeless tobacco: Never Used  Substance and Sexual Activity  . Alcohol use: Never    Frequency: Never  . Drug use: Never  . Sexual activity: Not Currently  Lifestyle  . Physical activity:    Days per week: Not on file    Minutes per session: Not on file  . Stress: Not on file  Relationships  . Social connections:    Talks on phone: More than three times a week    Gets together: Three times a week    Attends religious service: 1 to 4 times per year    Active member of club or organization: Not on file    Attends meetings of  clubs or organizations: Not on file    Relationship status: Widowed  . Intimate partner violence:    Fear of current or ex partner: No    Emotionally abused: No    Physically abused: No    Forced sexual activity: No  Other Topics Concern  . Not on file  Social History Narrative   She was completely independent up to 2 years ago, but since than her sons decided to take her in. She was oldest son in [place] and moved to [place] since Summer 2019 to live with younger son     Current Outpatient Medications:  .  aspirin EC 81 MG tablet, Take 81 mg by mouth daily., Disp: , Rfl:  .  clopidogrel (PLAVIX) 75 MG tablet, Take 1 tablet (75 mg total) by mouth daily., Disp: 90 tablet, Rfl: 0  Allergies  Allergen Reactions  . Iohexol     "makes me pass out"    I personally reviewed active problem list, medication list, allergies, family history, social history with the patient/caregiver  today.   ROS  Constitutional: Negative for fever or weight change - she states weight at [facility] was not correct.  Respiratory: Negative for cough and shortness of breath.   Cardiovascular: Negative for chest pain or palpitations.  Gastrointestinal: Negative for abdominal pain, no bowel changes.  Musculoskeletal: positive  for gait problem but no  joint swelling.  Skin: Negative for rash.  Neurological: Negative for dizziness or headache.  No other specific complaints in a complete review of systems (except as listed in HPI above).  Objective  Vitals:   03/17/18 1326  BP: (!) 120/56  Pulse: 95  Resp: 16  Temp: (!) 97.5 F (36.4 C)  TempSrc: Oral  SpO2: 96%  Weight: 165 lb 4.8 oz (75 kg)  Height: 5\' 3"  (1.6 m)    Body mass index is 29.28 kg/m.  Physical Exam  Constitutional: Patient appears well-developed and well-nourished. Obese  No distress.  HEENT: head atraumatic, normocephalic, pupils equal and reactive to light,  neck supple, throat within normal limits Cardiovascular: Normal rate, regular rhythm and normal heart sounds.  No murmur heard. No BLE edema. Pulmonary/Chest: Effort normal and breath sounds right base crackles, and CXR done at [facility] possible aspiration pneumonia  No respiratory distress. Abdominal: Soft.  There is no tenderness. Psychiatric: Patient has a normal mood and affect. behavior is normal. Judgment and thought content normal.  Recent Results (from the past 2160 hour(s))  Comprehensive metabolic panel     Status: Abnormal   Collection Time: 02/25/18 10:59 AM  Result Value Ref Range   Sodium 138 135 - 145 mmol/L   Potassium 3.9 3.5 - 5.1 mmol/L   Chloride 102 98 - 111 mmol/L   CO2 22 22 - 32 mmol/L   Glucose, Bld 125 (H) 70 - 99 mg/dL   BUN 18 8 - 23 mg/dL   Creatinine, Ser 9.56 0.44 - 1.00 mg/dL   Calcium 9.4 8.9 - 38.7 mg/dL   Total Protein 8.0 6.5 - 8.1 g/dL   Albumin 4.5 3.5 - 5.0 g/dL   AST 26 15 - 41 U/L   ALT 15 0 - 44 U/L   Alkaline  Phosphatase 72 38 - 126 U/L   Total Bilirubin 1.1 0.3 - 1.2 mg/dL   GFR calc non Af Amer 50 (L) >60 mL/min   GFR calc Af Amer 58 (L) >60 mL/min   Anion gap 14 5 - 15    Comment: Performed at [facility], [address] [place], [place] [address]  CBC with Differential     Status: Abnormal   Collection Time: 02/25/18 10:59 AM  Result Value Ref Range   WBC 19.1 (H) 4.0 - 10.5 K/uL   RBC 4.73 3.87 - 5.11 MIL/uL   Hemoglobin 14.3 12.0 - 15.0 g/dL   HCT 60.4 54.0 - 98.1 %   MCV 91.1 80.0 - 100.0 fL   MCH 30.2 26.0 - 34.0 pg   MCHC 33.2 30.0 - 36.0 g/dL   RDW 19.1 47.8 - 29.5 %   Platelets 350 150 - 400 K/uL   nRBC 0.0 0.0 - 0.2 %   Neutrophils Relative % 86 %   Neutro Abs 16.5 (H) 1.7 - 7.7 K/uL   Lymphocytes Relative 10 %   Lymphs Abs 1.9 0.7 - 4.0 K/uL   Monocytes Relative 3 %   Monocytes Absolute 0.5 0.1 - 1.0 K/uL   Eosinophils Relative 0 %   Eosinophils Absolute 0.0 0.0 - 0.5 K/uL   Basophils Relative 0 %   Basophils Absolute 0.1 0.0 - 0.1 K/uL   Immature Granulocytes 1 %   Abs Immature Granulocytes 0.12 (H) 0.00 - 0.07 K/uL    Comment: Performed at [facility], [address], [place], [place] [address]  Protime-INR     Status: None   Collection Time: 02/25/18 10:59 AM  Result Value Ref Range   Prothrombin Time 13.3 11.4 - 15.2 seconds   INR 1.02     Comment: Performed at [facility], [address] [place], [place] [address]  Culture, blood (Routine x 2)     Status: Abnormal   Collection Time: 02/25/18 10:59 AM  Result Value Ref Range   Specimen Description      BLOOD RIGHT FA Performed at [facility], [address] [place], [place] [address]    Special Requests      BOTTLES DRAWN AEROBIC AND ANAEROBIC Blood Culture results may not be optimal due to an excessive volume of blood received in culture bottles Performed at [facility], [address] [place], [place] [address]    Culture  Setup Time       Organism ID to follow GRAM POSITIVE COCCI IN BOTH AEROBIC AND ANAEROBIC BOTTLES CRITICAL RESULT CALLED TO, READ BACK BY AND VERIFIED WITH: MATT [facility] 02/25/18 2200 REC Performed at [facility], [address] [place], [place] [address]    Culture GROUP B STREP(S.AGALACTIAE)ISOLATED (A)    Report Status 02/28/2018 FINAL    Organism ID, Bacteria GROUP B STREP(S.AGALACTIAE)ISOLATED       Susceptibility   Group b strep(s.agalactiae)isolated - MIC*    CLINDAMYCIN >=1 RESISTANT Resistant     AMPICILLIN <=0.25 SENSITIVE Sensitive     ERYTHROMYCIN >=8 RESISTANT Resistant     VANCOMYCIN 0.5 SENSITIVE Sensitive     CEFTRIAXONE <=0.12 SENSITIVE Sensitive     LEVOFLOXACIN 0.5 SENSITIVE Sensitive     * GROUP B STREP(S.AGALACTIAE)ISOLATED  Urinalysis, Complete w Microscopic     Status: Abnormal   Collection Time: 02/25/18 10:59 AM  Result Value Ref Range   Color, Urine YELLOW (A) YELLOW   APPearance HAZY (A) CLEAR   Specific Gravity, Urine 1.013 1.005 - 1.030   pH 5.0 5.0 - 8.0   Glucose, UA NEGATIVE NEGATIVE mg/dL   Hgb urine dipstick SMALL (A) NEGATIVE   Bilirubin Urine NEGATIVE NEGATIVE   Ketones, ur NEGATIVE NEGATIVE mg/dL   Protein, ur 010 (A) NEGATIVE mg/dL  Nitrite NEGATIVE NEGATIVE   Leukocytes, UA NEGATIVE NEGATIVE   RBC / HPF 6-10 0 - 5 RBC/hpf   WBC, UA 11-20 0 - 5 WBC/hpf   Bacteria, UA RARE (A) NONE SEEN   Squamous Epithelial / LPF 0-5 0 - 5   Mucus PRESENT    Amorphous Crystal PRESENT     Comment: Performed at [facility], [address], [place], [place] [address]  Urine culture     Status: None   Collection Time: 02/25/18 10:59 AM  Result Value Ref Range   Specimen Description      URINE, RANDOM Performed at [facility], [address], [place], [place] [address]    Special Requests      NONE Performed at [facility], [address], [place], [place] [address]    Culture      NO GROWTH Performed at [facility], [address], [place], [place] [address]    Report Status 02/26/2018 FINAL   Troponin I - Add-On to previous collection     Status: None   Collection Time: 02/25/18 10:59 AM  Result Value Ref Range   Troponin I <0.03 <0.03 ng/mL    Comment: Performed at [facility], [address] [place], [place] [address]  Brain natriuretic peptide     Status: None   Collection Time: 02/25/18 10:59 AM  Result Value Ref Range   B Natriuretic Peptide 60.0 0.0 - 100.0 pg/mL    Comment: Performed at [facility], [address] [place], [place] [address]  Blood Culture ID Panel (Reflexed)     Status: Abnormal   Collection Time: 02/25/18 10:59 AM  Result Value Ref Range   Enterococcus species NOT DETECTED NOT DETECTED   Listeria monocytogenes NOT DETECTED NOT DETECTED   Staphylococcus species NOT DETECTED NOT DETECTED   Staphylococcus aureus (BCID) NOT DETECTED NOT DETECTED   Streptococcus species DETECTED (A) NOT DETECTED    Comment: CRITICAL RESULT CALLED TO, READ BACK BY AND VERIFIED WITH: MATT [facility] 02/25/18 2200 REC    Streptococcus agalactiae DETECTED (A) NOT DETECTED    Comment: CRITICAL RESULT CALLED TO, READ BACK BY AND VERIFIED WITH: MATT [facility] 02/25/18 2200 REC    Streptococcus pneumoniae NOT DETECTED NOT DETECTED   Streptococcus pyogenes NOT DETECTED NOT DETECTED   Acinetobacter baumannii NOT DETECTED NOT DETECTED   Enterobacteriaceae species NOT DETECTED NOT DETECTED   Enterobacter cloacae complex NOT DETECTED NOT DETECTED   Escherichia coli NOT DETECTED NOT DETECTED   Klebsiella oxytoca NOT DETECTED NOT DETECTED   Klebsiella pneumoniae NOT DETECTED NOT DETECTED   Proteus species NOT DETECTED NOT DETECTED   Serratia marcescens NOT DETECTED NOT DETECTED   Haemophilus influenzae NOT DETECTED NOT DETECTED   Neisseria meningitidis NOT DETECTED NOT DETECTED   Pseudomonas aeruginosa NOT DETECTED NOT DETECTED   Candida albicans NOT DETECTED NOT DETECTED    Candida glabrata NOT DETECTED NOT DETECTED   Candida krusei NOT DETECTED NOT DETECTED   Candida parapsilosis NOT DETECTED NOT DETECTED   Candida tropicalis NOT DETECTED NOT DETECTED    Comment: Performed at [facility], [address] [place], [place] [address]  Culture, blood (Routine x 2)     Status: Abnormal   Collection Time: 02/25/18 11:00 AM  Result Value Ref Range   Specimen Description      BLOOD LEFT FA Performed at [facility], [address], [place], [place] [address]    Special Requests  BOTTLES DRAWN AEROBIC AND ANAEROBIC Blood Culture results may not be optimal due to an excessive volume of blood received in culture bottles Performed at [facility], [address] [place], [place] [address]    Culture  Setup Time      GRAM POSITIVE COCCI AEROBIC BOTTLE ONLY CRITICAL VALUE NOTED.  VALUE IS CONSISTENT WITH PREVIOUSLY REPORTED AND CALLED VALUE. Performed at [facility], [address] [place], [place] [address]    Culture (A)     GROUP B STREP(S.AGALACTIAE)ISOLATED SUSCEPTIBILITIES PERFORMED ON PREVIOUS CULTURE WITHIN THE LAST 5 DAYS. Performed at [facility], [address], [place], [place] [address]    Report Status 02/28/2018 FINAL   Gastrointestinal Panel by PCR , Stool     Status: None   Collection Time: 02/25/18 11:00 AM  Result Value Ref Range   Campylobacter species NOT DETECTED NOT DETECTED   Plesimonas shigelloides NOT DETECTED NOT DETECTED   Salmonella species NOT DETECTED NOT DETECTED   Yersinia enterocolitica NOT DETECTED NOT DETECTED   Vibrio species NOT DETECTED NOT DETECTED   Vibrio cholerae NOT DETECTED NOT DETECTED   Enteroaggregative E coli (EAEC) NOT DETECTED NOT DETECTED   Enteropathogenic E coli (EPEC) NOT DETECTED NOT DETECTED   Enterotoxigenic E coli (ETEC) NOT DETECTED NOT DETECTED   Shiga like toxin producing E coli (STEC) NOT DETECTED NOT DETECTED   Shigella/Enteroinvasive E  coli (EIEC) NOT DETECTED NOT DETECTED   Cryptosporidium NOT DETECTED NOT DETECTED   Cyclospora cayetanensis NOT DETECTED NOT DETECTED   Entamoeba histolytica NOT DETECTED NOT DETECTED   Giardia lamblia NOT DETECTED NOT DETECTED   Adenovirus F40/41 NOT DETECTED NOT DETECTED   Astrovirus NOT DETECTED NOT DETECTED   Norovirus GI/GII NOT DETECTED NOT DETECTED   Rotavirus A NOT DETECTED NOT DETECTED   Sapovirus (I, II, IV, and V) NOT DETECTED NOT DETECTED    Comment: Performed at [facility], [address] [place], [place] [address]  C difficile quick scan w PCR reflex     Status: None   Collection Time: 02/25/18 11:00 AM  Result Value Ref Range   C Diff antigen NEGATIVE NEGATIVE   C Diff toxin NEGATIVE NEGATIVE   C Diff interpretation No C. difficile detected.     Comment: Performed at [facility], [address] [place], [place] [address]  CG4 I-STAT (Lactic acid)     Status: Abnormal   Collection Time: 02/25/18 11:04 AM  Result Value Ref Range   Lactic Acid, Venous 4.49 (HH) 0.5 - 1.9 mmol/L   Comment NOTIFIED PHYSICIAN   Influenza panel by PCR (type A & B)     Status: None   Collection Time: 02/25/18  1:08 PM  Result Value Ref Range   Influenza A By PCR NEGATIVE NEGATIVE   Influenza B By PCR NEGATIVE NEGATIVE    Comment: (NOTE) The Xpert Xpress Flu assay is intended as an aid in the diagnosis of  influenza and should not be used as a sole basis for treatment.  This  assay is FDA approved for nasopharyngeal swab specimens only. Nasal  washings and aspirates are unacceptable for Xpert Xpress Flu testing. Performed at [facility], [address] [place], [place] [address]   Blood gas, venous     Status: Abnormal   Collection Time: 02/25/18  1:08 PM  Result Value Ref Range   pH, Ven 7.35 7.250 - 7.430   pCO2, Ven 46 44.0 - 60.0 mmHg   pO2,  Ven <31.0 (LL) 32.0 - 45.0 mmHg    Comment: VENOUS   Bicarbonate 25.4 20.0 - 28.0 mmol/L   Acid-base  deficit 0.6 0.0 - 2.0 mmol/L   O2 Saturation 38.9 %   Patient temperature 37.0    Collection site VEIN    Sample type VENOUS     Comment: Performed at [facility], [address] [place], [place] [address]  CG4 I-STAT (Lactic acid)     Status: Abnormal   Collection Time: 02/25/18  2:44 PM  Result Value Ref Range   Lactic Acid, Venous 2.91 (HH) 0.5 - 1.9 mmol/L   Comment NOTIFIED PHYSICIAN   Ammonia     Status: None   Collection Time: 02/25/18  3:52 PM  Result Value Ref Range   Ammonia 11 9 - 35 umol/L    [facility]: Performed at [facility], [address] [place], [place] [address]  RPR     Status: None   Collection Time: 02/25/18  3:52 PM  Result Value Ref Range   RPR Ser Ql Non Reactive Non Reactive    Comment: (NOTE) Performed At: [facility] [address] [place], [place] [id number] Jolene SchimkeNagendra Sanjai MD ZH:[id number]:[phone]   RPR     Status: Abnormal   Collection Time: 02/25/18  3:52 PM  Result Value Ref Range   RPR Ser Ql Reactive (A) Non Reactive    Comment: (NOTE) Performed At: [facility] [address] [place], [place] [id number] Jolene SchimkeNagendra Sanjai MD GM:[id number]:[phone]   CBC with Differential     Status: Abnormal   Collection Time: 02/25/18  3:52 PM  Result Value Ref Range   WBC 26.7 (H) 4.0 - 10.5 K/uL    Comment: WHITE COUNT CONFIRMED ON SMEAR   RBC 4.18 3.87 - 5.11 MIL/uL   Hemoglobin 12.7 12.0 - 15.0 g/dL   HCT 44.038.4 34.736.0 - 42.546.0 %   MCV 91.9 80.0 - 100.0 fL   MCH 30.4 26.0 - 34.0 pg   MCHC 33.1 30.0 - 36.0 g/dL   RDW 95.614.2 38.711.5 - 56.415.5 %   Platelets 296 150 - 400 K/uL   nRBC 0.0 0.0 - 0.2 %   Neutrophils Relative % 86 %   Neutro Abs 22.9 (H) 1.7 - 7.7 K/uL   Lymphocytes Relative 3 %   Lymphs Abs 0.8 0.7 - 4.0 K/uL   Monocytes Relative 8 %   Monocytes Absolute 2.0 (H) 0.1 - 1.0 K/uL   Eosinophils Relative 0 %   Eosinophils Absolute 0.1 0.0 - 0.5 K/uL   Basophils Relative 0 %   Basophils Absolute 0.1 0.0 - 0.1 K/uL   WBC  Morphology MORPHOLOGY UNREMARKABLE    RBC Morphology MORPHOLOGY UNREMARKABLE    Smear Review Normal platelet morphology    Immature Granulocytes 3 %   Abs Immature Granulocytes 0.83 (H) 0.00 - 0.07 K/uL    Comment: Performed at [facility], [address] [place], [place] [address]  Comprehensive metabolic panel     Status: Abnormal   Collection Time: 02/25/18  3:52 PM  Result Value Ref Range   Sodium 136 135 - 145 mmol/L   Potassium 3.0 (L) 3.5 - 5.1 mmol/L   Chloride 104 98 - 111 mmol/L   CO2 23 22 - 32 mmol/L   Glucose, Bld 121 (H) 70 - 99 mg/dL   BUN 20 8 - 23 mg/dL   Creatinine, Ser 1.881.09 (H) 0.44 - 1.00 mg/dL   Calcium 8.4 (L) 8.9 - 10.3 mg/dL  Total Protein 6.4 (L) 6.5 - 8.1 g/dL   Albumin 3.5 3.5 - 5.0 g/dL   AST 26 15 - 41 U/L   ALT 16 0 - 44 U/L   Alkaline Phosphatase 58 38 - 126 U/L   Total Bilirubin 1.4 (H) 0.3 - 1.2 mg/dL   GFR calc non Af Amer 45 (L) >60 mL/min   GFR calc Af Amer 52 (L) >60 mL/min   Anion gap 9 5 - 15    Comment: Performed at [facility], [address] [place], [place] [address]  Lactic acid, plasma     Status: Abnormal   Collection Time: 02/25/18  3:52 PM  Result Value Ref Range   Lactic Acid, Venous 2.4 (HH) 0.5 - 1.9 mmol/L    Comment: CRITICAL RESULT CALLED TO, READ BACK BY AND VERIFIED WITH MALKA SINWANY AT 1707 ON 02/25/18 [facility]. Performed at [facility], [address] [place], [place] [address]   Procalcitonin     Status: None   Collection Time: 02/25/18  3:52 PM  Result Value Ref Range   Procalcitonin 26.01 ng/mL    Comment:        Interpretation: PCT >= 10 ng/mL: Important systemic inflammatory response, almost exclusively due to severe bacterial sepsis or septic shock. (NOTE)       Sepsis PCT Algorithm           Lower Respiratory Tract                                      Infection PCT Algorithm    ----------------------------     ----------------------------         PCT < 0.25 ng/mL                 PCT < 0.10 ng/mL         Strongly encourage             Strongly discourage   discontinuation of antibiotics    initiation of antibiotics    ----------------------------     -----------------------------       PCT 0.25 - 0.50 ng/mL            PCT 0.10 - 0.25 ng/mL               OR       >80% decrease in PCT            Discourage initiation of                                            antibiotics      Encourage discontinuation           of antibiotics    ----------------------------     -----------------------------         PCT >= 0.50 ng/mL              PCT 0.26 - 0.50 ng/mL                AND       <80% decrease in PCT             Encourage initiation of  antibiotics       Encourage continuation           of antibiotics    ----------------------------     -----------------------------        PCT >= 0.50 ng/mL                  PCT > 0.50 ng/mL               AND         increase in PCT                  Strongly encourage                                      initiation of antibiotics    Strongly encourage escalation           of antibiotics                                     -----------------------------                                           PCT <= 0.25 ng/mL                                                 OR                                        > 80% decrease in PCT                                     Discontinue / Do not initiate                                             antibiotics Performed at [facility], [address] [place], [place] [address]   Protime-INR     Status: None   Collection Time: 02/25/18  3:52 PM  Result Value Ref Range   Prothrombin Time 14.3 11.4 - 15.2 seconds   INR 1.12     Comment: Performed at [facility], [address] [place], [place] [address]  APTT     Status: Abnormal   Collection Time: 02/25/18  3:52 PM  Result Value Ref Range   aPTT 42 (H) 24 - 36 seconds     Comment:        IF BASELINE aPTT IS ELEVATED, SUGGEST PATIENT RISK ASSESSMENT BE USED TO DETERMINE APPROPRIATE ANTICOAGULANT THERAPY. Performed at [facility], [address] [place], [place] [address]   RPR, quant & T.pallidum antibodies     Status: Abnormal   Collection Time: 02/25/18  3:52 PM  Result Value Ref Range   Rapid Plasma Reagin, Quant 1:1 (H) NonRea<1:1   T Pallidum  Abs Reactive (A) Non Reactive    Comment: (NOTE)              **Please note reference interval change** Performed At: [facility] [address] [place], [place] [id number] Jolene Schimke MD NW:[id number]   Culture, blood (x 2)     Status: None   Collection Time: 02/25/18  4:03 PM  Result Value Ref Range   Specimen Description BLOOD RIGHT ANTECUBITAL    Special Requests      BOTTLES DRAWN AEROBIC AND ANAEROBIC Blood Culture results may not be optimal due to an excessive volume of blood received in culture bottles   Culture      NO GROWTH 5 DAYS Performed at [facility], [address] [place], [place] [address]    Report Status 03/02/2018 FINAL   Culture, blood (x 2)     Status: None   Collection Time: 02/25/18  4:15 PM  Result Value Ref Range   Specimen Description BLOOD BLOOD RIGHT HAND    Special Requests      BOTTLES DRAWN AEROBIC AND ANAEROBIC Blood Culture adequate volume   Culture      NO GROWTH 5 DAYS Performed at [facility], [address] [place], [place] [address]    Report Status 03/02/2018 FINAL   Lactic acid, plasma     Status: Abnormal   Collection Time: 02/25/18  6:44 PM  Result Value Ref Range   Lactic Acid, Venous 2.4 (HH) 0.5 - 1.9 mmol/L    Comment: CRITICAL RESULT CALLED TO, READ BACK BY AND VERIFIED WITH CYNTHIA @1919  02/25/18 Performed at [facility], [address] [place], [place] [address]   Basic metabolic panel     Status: Abnormal   Collection Time: 02/26/18  4:25 AM  Result Value Ref Range   Sodium 135 135  - 145 mmol/L   Potassium 4.0 3.5 - 5.1 mmol/L   Chloride 107 98 - 111 mmol/L   CO2 19 (L) 22 - 32 mmol/L   Glucose, Bld 126 (H) 70 - 99 mg/dL   BUN 25 (H) 8 - 23 mg/dL   Creatinine, Ser 2.44 (H) 0.44 - 1.00 mg/dL   Calcium 7.9 (L) 8.9 - 10.3 mg/dL   GFR calc non Af Amer 40 (L) >60 mL/min   GFR calc Af Amer 46 (L) >60 mL/min   Anion gap 9 5 - 15    Comment: Performed at [facility], [address] [place], [place] [address]  CBC     Status: Abnormal   Collection Time: 02/26/18  4:25 AM  Result Value Ref Range   WBC 26.5 (H) 4.0 - 10.5 K/uL   RBC 3.91 3.87 - 5.11 MIL/uL   Hemoglobin 11.7 (L) 12.0 - 15.0 g/dL   HCT 25.3 66.4 - 40.3 %   MCV 93.4 80.0 - 100.0 fL   MCH 29.9 26.0 - 34.0 pg   MCHC 32.1 30.0 - 36.0 g/dL   RDW 47.4 25.9 - 56.3 %   Platelets 244 150 - 400 K/uL   nRBC 0.0 0.0 - 0.2 %    Comment: Performed at [facility], [address] [place], [place] [address]  ECHOCARDIOGRAM COMPLETE     Status: None   Collection Time: 02/26/18 12:02 PM  Result Value Ref Range   Weight 3,088 oz   Height 63 in   BP 95/50 mmHg  Blood gas, arterial     Status: Abnormal   Collection Time: 02/27/18  2:16 AM  Result Value Ref Range   FIO2 0.32    Delivery systems NASAL CANNULA    pH, Arterial 7.37 7.350 - 7.450   pCO2 arterial 29 (L) 32.0 - 48.0 mmHg   pO2, Arterial 102 83.0 - 108.0 mmHg   Bicarbonate 16.8 (L) 20.0 - 28.0 mmol/L   Acid-base deficit 7.1 (H) 0.0 - 2.0 mmol/L   O2 Saturation 97.7 %   Patient temperature 37.0    Collection site RIGHT RADIAL    Sample type ARTERIAL DRAW    Allens test (pass/fail) PASS PASS    Comment: Performed at [facility], [address] [place], [place] [address]  Troponin I - ONCE - STAT     Status: Abnormal   Collection Time: 02/27/18  4:50 AM  Result Value Ref Range   Troponin I 0.35 (HH) <0.03 ng/mL    Comment: CRITICAL RESULT CALLED TO, READ BACK BY AND VERIFIED WITH KASEY VAUGHNS WARD ON 02/27/18 AT 0524  QSD Performed at [facility], [address] [place], [place] [address]   Lactic acid, plasma     Status: None   Collection Time: 02/27/18  4:50 AM  Result Value Ref Range   Lactic Acid, Venous 1.2 0.5 - 1.9 mmol/L    Comment: Performed at [facility] [address] [place], [place] [address]  CBC     Status: Abnormal   Collection Time: 02/27/18  4:50 AM  Result Value Ref Range   WBC 17.2 (H) 4.0 - 10.5 K/uL   RBC 3.66 (L) 3.87 - 5.11 MIL/uL   Hemoglobin 10.9 (L) 12.0 - 15.0 g/dL   HCT 91.4 (L) 78.2 - 95.6 %   MCV 95.1 80.0 - 100.0 fL   MCH 29.8 26.0 - 34.0 pg   MCHC 31.3 30.0 - 36.0 g/dL   RDW 21.3 08.6 - 57.8 %   Platelets 250 150 - 400 K/uL   nRBC 0.0 0.0 - 0.2 %    Comment: Performed at [facility], [address] [place], [place] [address]  Basic metabolic panel     Status: Abnormal   Collection Time: 02/27/18  4:50 AM  Result Value Ref Range   Sodium 137 135 - 145 mmol/L   Potassium 4.2 3.5 - 5.1 mmol/L   Chloride 109 98 - 111 mmol/L   CO2 20 (L) 22 - 32 mmol/L   Glucose, Bld 109 (H) 70 - 99 mg/dL   BUN 23 8 - 23 mg/dL   Creatinine, Ser 9.52 0.44 - 1.00 mg/dL   Calcium 8.1 (L) 8.9 - 10.3 mg/dL   GFR calc non Af Amer 52 (L) >60 mL/min   GFR calc Af Amer 60 (L) >60 mL/min   Anion gap 8 5 - 15    Comment: Performed at [facility], [address] [place], [place] [address]  Lactic acid, plasma     Status: None   Collection Time: 02/27/18  7:51 AM  Result Value Ref Range   Lactic Acid, Venous 1.3 0.5 - 1.9 mmol/L    Comment: Performed at [facility], [address] [place], [place] [address]  Troponin I - Now Then Q4H     Status: Abnormal   Collection Time: 02/27/18  2:48 PM  Result Value Ref Range   Troponin I 0.26 (HH) <0.03 ng/mL    Comment: CRITICAL VALUE NOTED. VALUE IS CONSISTENT WITH PREVIOUSLY REPORTED/CALLED VALUE AKT Performed at [facility], [address] [place], [place] [address]  Troponin I - Now Then Q4H     Status: Abnormal   Collection Time: 02/27/18  5:58 PM  Result Value Ref Range   Troponin I 0.26 (HH) <0.03 ng/mL    Comment: CRITICAL VALUE NOTED. VALUE IS CONSISTENT WITH PREVIOUSLY REPORTED/CALLED VALUE.  TFK Performed at [facility], [address] [place], [place] [address]   Hepatic function panel     Status: None   Collection Time: 02/27/18  5:58 PM  Result Value Ref Range   Total Protein 6.8 6.5 - 8.1 g/dL   Albumin 3.5 3.5 - 5.0 g/dL   AST 31 15 - 41 U/L   ALT 19 0 - 44 U/L   Alkaline Phosphatase 57 38 - 126 U/L   Total Bilirubin 0.8 0.3 - 1.2 mg/dL   Bilirubin, Direct 0.1 0.0 - 0.2 mg/dL   Indirect Bilirubin 0.7 0.3 - 0.9 mg/dL    Comment: Performed at [facility], [address] [place], [place] [address]  CBC     Status: Abnormal   Collection Time: 02/28/18  6:01 AM  Result Value Ref Range   WBC 10.1 4.0 - 10.5 K/uL   RBC 3.67 (L) 3.87 - 5.11 MIL/uL   Hemoglobin 11.0 (L) 12.0 - 15.0 g/dL   HCT 09.8 (L) 11.9 - 14.7 %   MCV 91.6 80.0 - 100.0 fL   MCH 30.0 26.0 - 34.0 pg   MCHC 32.7 30.0 - 36.0 g/dL   RDW 82.9 56.2 - 13.0 %   Platelets 242 150 - 400 K/uL   nRBC 0.0 0.0 - 0.2 %    Comment: Performed at [facility], [address] [place], [place] [address]  Glucose, capillary     Status: Abnormal   Collection Time: 02/28/18  5:01 PM  Result Value Ref Range   Glucose-Capillary 126 (H) 70 - 99 mg/dL   Comment 1 Notify RN   POCT Urinalysis Dipstick     Status: Abnormal   Collection Time: 03/17/18  1:49 PM  Result Value Ref Range   Color, UA Yellow    Clarity, UA Clear    Glucose, UA Negative Negative   Bilirubin, UA Negative    Ketones, UA Negative    Spec Grav, UA 1.025 1.010 - 1.025   Blood, UA Postive    pH, UA 6.0 5.0 - 8.0   Protein, UA Negative Negative   Urobilinogen, UA 0.2 0.2 or 1.0 E.U./dL   Nitrite, UA Negative    Leukocytes, UA Moderate (2+) (A) Negative   Appearance Normal     Odor Normal       PHQ2/9: Depression screen [facility] 2/9 03/17/2018  Decreased Interest 0  Down, Depressed, Hopeless 0  PHQ - 2 Score 0    Fall Risk: Fall Risk  03/17/2018  Falls in the past year? 0  Number falls in past yr: 0  Injury with Fall? 0  Follow up Falls prevention discussed    Assessment & Plan  1. Dysuria  - POCT Urinalysis Dipstick - Urine Culture - amoxicillin-clavulanate (AUGMENTIN) 875-125 MG tablet; Take 1 tablet by mouth 2 (two) times daily.  Dispense: 10 tablet; Refill: 0  2. PVD (peripheral vascular disease) (HCC)  - Lipid panel - Ambulatory referral to Vascular Surgery  3. History of femur fracture  remote  4. Needs flu shot  - Flu vaccine HIGH DOSE PF  5. Need for vaccination for pneumococcus  - Pneumococcal conjugate vaccine 13-valent IM  6. Sepsis due to group B  Streptococcus without acute organ dysfunction (HCC)  - CBC with Differential/Platelet  7. Decreased calculated GFR  - COMPLETE METABOLIC PANEL WITH GFR  8. Respiratory crackles at right lung base  - DG Chest 2 View; Future

## 2018-03-19 LAB — CBC WITH DIFFERENTIAL/PLATELET
ABSOLUTE MONOCYTES: 940 {cells}/uL (ref 200–950)
Basophils Absolute: 60 cells/uL (ref 0–200)
Basophils Relative: 0.6 %
EOS ABS: 80 {cells}/uL (ref 15–500)
Eosinophils Relative: 0.8 %
HCT: 42.1 % (ref 35.0–45.0)
Hemoglobin: 13.9 g/dL (ref 11.7–15.5)
Lymphs Abs: 3390 cells/uL (ref 850–3900)
MCH: 29.6 pg (ref 27.0–33.0)
MCHC: 33 g/dL (ref 32.0–36.0)
MCV: 89.6 fL (ref 80.0–100.0)
MPV: 10.5 fL (ref 7.5–12.5)
Monocytes Relative: 9.4 %
Neutro Abs: 5530 cells/uL (ref 1500–7800)
Neutrophils Relative %: 55.3 %
Platelets: 362 10*3/uL (ref 140–400)
RBC: 4.7 10*6/uL (ref 3.80–5.10)
RDW: 13.9 % (ref 11.0–15.0)
Total Lymphocyte: 33.9 %
WBC: 10 10*3/uL (ref 3.8–10.8)

## 2018-03-19 LAB — COMPLETE METABOLIC PANEL WITH GFR
AG Ratio: 1.5 (calc) (ref 1.0–2.5)
ALBUMIN MSPROF: 4.4 g/dL (ref 3.6–5.1)
ALT: 12 U/L (ref 6–29)
AST: 17 U/L (ref 10–35)
Alkaline phosphatase (APISO): 68 U/L (ref 33–130)
BUN/Creatinine Ratio: 16 (calc) (ref 6–22)
BUN: 18 mg/dL (ref 7–25)
CO2: 29 mmol/L (ref 20–32)
Calcium: 10.2 mg/dL (ref 8.6–10.4)
Chloride: 101 mmol/L (ref 98–110)
Creat: 1.1 mg/dL — ABNORMAL HIGH (ref 0.60–0.88)
GFR, Est African American: 52 mL/min/{1.73_m2} — ABNORMAL LOW (ref >=60)
GFR, Est Non African American: 45 mL/min/{1.73_m2} — ABNORMAL LOW (ref >=60)
Globulin: 3 g/dL (calc) (ref 1.9–3.7)
Glucose, Bld: 87 mg/dL (ref 65–99)
Potassium: 4.4 mmol/L (ref 3.5–5.3)
Sodium: 139 mmol/L (ref 135–146)
Total Bilirubin: 1 mg/dL (ref 0.2–1.2)
Total Protein: 7.4 g/dL (ref 6.1–8.1)

## 2018-03-19 LAB — LIPID PANEL
Cholesterol: 198 mg/dL (ref <200)
HDL: 48 mg/dL — ABNORMAL LOW (ref >50)
LDL Cholesterol (Calc): 114 mg/dL (calc) — ABNORMAL HIGH
Non-HDL Cholesterol (Calc): 150 mg/dL (calc) — ABNORMAL HIGH (ref <130)
Total CHOL/HDL Ratio: 4.1 (calc) (ref <5.0)
Triglycerides: 249 mg/dL — ABNORMAL HIGH (ref <150)

## 2018-03-19 LAB — URINE CULTURE
MICRO NUMBER:: 91509661
SPECIMEN QUALITY:: ADEQUATE

## 2018-03-20 ENCOUNTER — Other Ambulatory Visit: Payer: Self-pay | Admitting: Family Medicine

## 2018-03-20 MED ORDER — NITROFURANTOIN MONOHYD MACRO 100 MG PO CAPS: 100.0000 mg | ORAL_CAPSULE | Freq: Two times a day (BID) | ORAL | 0 refills | Status: DC

## 2018-04-29 ENCOUNTER — Telehealth: Payer: Self-pay | Admitting: Family Medicine

## 2018-04-29 ENCOUNTER — Encounter: Payer: Self-pay | Admitting: Family Medicine

## 2018-04-29 ENCOUNTER — Ambulatory Visit: Payer: Medicare Other | Admitting: Family Medicine

## 2018-04-29 ENCOUNTER — Ambulatory Visit
Admission: RE | Admit: 2018-04-29 | Discharge: 2018-04-29 | Disposition: A | Discharge status: Home / Self Care | Payer: Medicare Other | Attending: Family Medicine | Admitting: Family Medicine

## 2018-04-29 ENCOUNTER — Ambulatory Visit
Admission: RE | Admit: 2018-04-29 | Discharge: 2018-04-29 | Disposition: A | Discharge status: Home / Self Care | Payer: Medicare Other | Source: Ambulatory Visit | Attending: Family Medicine | Admitting: Family Medicine

## 2018-04-29 VITALS — BP 124/58 | HR 80 | Temp 97.3°F | Resp 14 | Ht 62.25 in | Wt 169.8 lb

## 2018-04-29 DIAGNOSIS — L539 Erythematous condition, unspecified: Secondary | ICD-10-CM | POA: Insufficient documentation

## 2018-04-29 DIAGNOSIS — H6121 Impacted cerumen, right ear: Secondary | ICD-10-CM | POA: Diagnosis not present

## 2018-04-29 DIAGNOSIS — I739 Peripheral vascular disease, unspecified: Secondary | ICD-10-CM

## 2018-04-29 DIAGNOSIS — E66811 Obesity, class 1: Secondary | ICD-10-CM

## 2018-04-29 DIAGNOSIS — M7989 Other specified soft tissue disorders: Secondary | ICD-10-CM | POA: Insufficient documentation

## 2018-04-29 DIAGNOSIS — D692 Other nonthrombocytopenic purpura: Secondary | ICD-10-CM

## 2018-04-29 DIAGNOSIS — L84 Corns and callosities: Secondary | ICD-10-CM

## 2018-04-29 DIAGNOSIS — K219 Gastro-esophageal reflux disease without esophagitis: Secondary | ICD-10-CM

## 2018-04-29 DIAGNOSIS — N183 Chronic kidney disease, stage 3 unspecified: Secondary | ICD-10-CM

## 2018-04-29 DIAGNOSIS — H353130 Nonexudative age-related macular degeneration, bilateral, stage unspecified: Secondary | ICD-10-CM | POA: Diagnosis not present

## 2018-04-29 DIAGNOSIS — H3530 Unspecified macular degeneration: Secondary | ICD-10-CM | POA: Insufficient documentation

## 2018-04-29 DIAGNOSIS — R350 Frequency of micturition: Secondary | ICD-10-CM

## 2018-04-29 DIAGNOSIS — E669 Obesity, unspecified: Secondary | ICD-10-CM

## 2018-04-29 DIAGNOSIS — N1831 Chronic kidney disease, stage 3a: Secondary | ICD-10-CM | POA: Insufficient documentation

## 2018-04-29 MED ORDER — DOXYCYCLINE HYCLATE 100 MG PO TABS: 100.0000 mg | ORAL_TABLET | Freq: Two times a day (BID) | ORAL | 0 refills | Status: DC

## 2018-04-29 MED ORDER — MUPIROCIN 2 % EX OINT: TOPICAL_OINTMENT | CUTANEOUS | 0 refills | Status: DC

## 2018-04-29 NOTE — Progress Notes (Signed)
Kristen MuirJamie, please let the patient know that there is no evidence of osteomyelitis on her xray. That's good news. We'll stay the course with her antibiotics and seeing the foot doctor soon.

## 2018-04-29 NOTE — Assessment & Plan Note (Signed)
Encouraged modest weight loss 

## 2018-04-29 NOTE — Assessment & Plan Note (Signed)
Patient denies weight loss, blood in stools, abd pain; avoid triggers when possible

## 2018-04-29 NOTE — Assessment & Plan Note (Signed)
Noted on labs; no NSAIDs

## 2018-04-29 NOTE — Progress Notes (Signed)
BP (!) 124/58 (BP Location: Left Arm, Patient Position: Sitting, Cuff Size: Large)   Pulse 80   Temp (!) 97.3 F (36.3 C) (Oral)   Resp 14   Ht 5' 2.25" (1.581 m)   Wt 169 lb 12.8 oz (77 kg)   SpO2 96%   BMI 30.81 kg/m    Subjective:    Patient ID: Kristen Dudley, female    DOB: December 08, 1927, 85 y.o.   MRN: [id number]  HPI: Kristen Dudley is a 85 y.o. female  Chief Complaint  Patient presents with  . Establish Care  . Toe Pain    patient has some issues with her right great toe    HPI Patient is actually already established here; she saw my partner, Dr. Carlynn Purl, in December  Patient lives with son and his wife who lived in [place] for a year and then moved with [place] last year in September. She is adjusting fine to the change. She states she is adjusting  Peripheral Vascular Disease: saw vascular doctor in [place] and had some stent placed in right leg was put on aspirin 81mg  and plavix 75mg . Does not notice excessive bruising but does have some small areas on arms. Denies bleeding gums, dark tarry stools. Notes mild bilateral leg pain and "tiredness".   Was in the hospital for sepsis 11/27-12/01, had hard toenail and cut it with a nail cutter but got skin off and thinks that is what caused sepsis, but no true source identified; treated with flagyl, ceftriaxone and followed by infectious disease. Had PICC line and was treated with IV antibiotics for 10 days and PICC was removed. No fatigue, fevers, chills, nausea, vomiting.   Discussed DEXA bone scan, patient declines.   Acid reflux, does not want treatment; tomatoes and greasy foods are her triggers, well controlled with Tums  Issues with her right great toe; that is where the infection started; going to see podiatrist in February; no redness or fever  Labs reviewed; GFR 45 in December  Some urinary frequency, but no burning or foul odor  Has trouble hearing in the right ear, ? wax   Depression screen [facility] [place] [facility] 2/9  04/29/2018 03/17/2018  Decreased Interest 0 0  Down, Depressed, Hopeless - 0  PHQ - 2 Score 0 0   Fall Risk  04/29/2018 03/17/2018  Falls in the past year? 0 0  Number falls in past yr: 0 0  Injury with Fall? 0 0  Follow up - Falls prevention discussed    Relevant past medical, surgical, family and social history reviewed Past Medical History:  Diagnosis Date  . Hearing loss   . PVD (peripheral vascular disease) (HCC)    Past Surgical History:  Procedure Laterality Date  . ABDOMINAL HYSTERECTOMY    . CATARACT EXTRACTION, BILATERAL  2007  . CHOLECYSTECTOMY OPEN  1961  . IR ERCP EXCH REM STENTS BIL PAN DUCT INC DIL EA STENT EXCH Right 2003   leg stents  . ORIF FEMUR FRACTURE Right 2014   Family History  Problem Relation Age of Onset  . Breast cancer Mother   . Heart disease Father    Social History   Tobacco Use  . Smoking status: Former Smoker    Packs/day: 0.50    Years: 50.00    Pack years: 25.00    Last attempt to quit: 05/01/2001    Years since quitting: 17.0  . Smokeless tobacco: Never Used  Substance Use Topics  . Alcohol use: Never  Frequency: Never  . Drug use: Never     [facility]  AUDIT-C Score  0      Interim medical history since last visit reviewed. Allergies and medications reviewed  Review of Systems  Constitutional: Negative for chills, fatigue and fever.  HENT: Negative for congestion, nosebleeds, rhinorrhea, sinus pressure, sinus pain and sore throat.   Eyes: Negative for photophobia, pain and redness.  Respiratory: Negative for chest tightness, shortness of breath and wheezing.   Cardiovascular: Negative for chest pain, palpitations and leg swelling.  Gastrointestinal: Negative for abdominal pain, blood in stool, constipation, diarrhea (states tends to have softer stools) and nausea.  Genitourinary: Positive for frequency. Negative for difficulty urinating, dysuria, flank pain, hematuria  and vaginal bleeding.  Musculoskeletal: Negative for back pain, gait problem and myalgias.  Skin: Negative for rash.  Neurological: Negative for dizziness, weakness, light-headedness and numbness.  Psychiatric/Behavioral: Negative for behavioral problems and sleep disturbance. The patient is not nervous/anxious.    Per HPI unless specifically indicated above     Objective:    BP (!) 124/58 (BP Location: Left Arm, Patient Position: Sitting, Cuff Size: Large)   Pulse 80   Temp (!) 97.3 F (36.3 C) (Oral)   Resp 14   Ht 5' 2.25" (1.581 m)   Wt 169 lb 12.8 oz (77 kg)   SpO2 96%   BMI 30.81 kg/m   Wt Readings from Last 3 Encounters:  04/29/18 169 lb 12.8 oz (77 kg)  03/17/18 165 lb 4.8 oz (75 kg)  02/25/18 193 lb (87.5 kg)    Nov 2019 weight was hospitalized, ?? Weighed ?? MD note; patient says she has never weighed in the 190s; she weighed 165 pounds, NOT 193 pounds  Physical Exam Constitutional:      General: She is not in acute distress.    Appearance: She is well-developed. She is obese.  HENT:     Right Ear: External ear normal. There is impacted cerumen.     Left Ear: Tympanic membrane, ear canal and external ear normal.     Mouth/Throat:     Lips: Pink.     Mouth: Mucous membranes are moist.  Eyes:     General: No scleral icterus. Cardiovascular:     Rate and Rhythm: Normal rate and regular rhythm.  Pulmonary:     Effort: Pulmonary effort is normal.     Breath sounds: Normal breath sounds.  Abdominal:     General: There is no distension.     Palpations: Abdomen is soft.  Musculoskeletal:       Feet:  Feet:     Comments: Thick callus on the distal end of the right 2nd toe; erythema along medial aspect of the 2nd toe; thick toenail; great toenail down to the quick, tiny tiny site of redness but no active bleeding;  Skin:    Findings: Bruising: left hand and right arm.  Neurological:     Mental Status: She is alert.  Psychiatric:        Mood and Affect: Mood  normal.        Behavior: Behavior normal.       Assessment & Plan:   Problem List Items Addressed This Visit      Cardiovascular and Mediastinum   Senile purpura (HCC)    Chronic stable problem      PVD (peripheral vascular disease) (HCC)    Patient was referred to vascular in December but I do not  see future appointment; will ask staff to check on that; continue aspirin and plavix (no evidence of abnormal bleeding on DAPT)        Digestive   Mild acid reflux    Patient denies weight loss, blood in stools, abd pain; avoid triggers when possible        Genitourinary   CKD (chronic kidney disease) stage 3, GFR 30-59 ml/min (HCC)    Noted on labs; no NSAIDs        Other   Obesity (BMI 30.0-34.9)    Encouraged modest weight loss      Macular degeneration of both eyes    Other Visit Diagnoses    Peripheral vascular disease (HCC)    -  Primary   Callus of foot       Relevant Orders   DG Toe 2nd Right (Completed)   Urine frequency       check urine, reflux culture   Relevant Orders   Urinalysis w microscopic + reflex cultur   Impacted cerumen of right ear       Relevant Orders   Ear Lavage   Toe erythema       will get xray of the toe given hx of infection, presence of ongoing callus, start ABX; patient to see podiatry soon; DO NOT PICK at the callus   Relevant Orders   DG Toe 2nd Right (Completed)       Follow up plan: Return in about 3 months (around 07/29/2018) for follow-up visit with Dr. Sherie Don.  An after-visit summary was printed and given to the patient at check-out.  Please see the patient instructions which may contain other information and recommendations beyond what is mentioned above in the assessment and plan.  Meds ordered this encounter  Medications  . doxycycline (VIBRA-TABS) 100 MG tablet    Sig: Take 1 tablet (100 mg total) by mouth 2 (two) times daily.    Dispense:  14 tablet    Refill:  0  . mupirocin ointment (BACTROBAN) 2 %    Sig:  Apply to areas on the toes twice a day    Dispense:  22 g    Refill:  0    Orders Placed This Encounter  Procedures  . DG Toe 2nd Right  . Urinalysis w microscopic + reflex cultur  . Ear Lavage

## 2018-04-29 NOTE — Patient Instructions (Addendum)
Do not pick at that callus and keep the appointment with the foot doctor If you start to notice pain or fever or redness or swelling, go to urgent care or the ER right away Start the antibiotics Use the topical on the areas that may look infected or where you trimmed the nail too short  Please do eat yogurt or kimchi or take a probiotic daily for the next month We want to replace the healthy germs in the gut If you notice foul, watery diarrhea in the next two months, schedule an appointment RIGHT AWAY or go to an urgent care or the emergency room if a holiday or over a weekend  Go across the street to get the xray of your toe today  Check out the information at familydoctor.org entitled "Nutrition for Weight Loss: What You Need to Know about Fad Diets" Try to lose between 1-2 pounds per week by taking in fewer calories and burning off more calories You can succeed by limiting portions, limiting foods dense in calories and fat, becoming more active, and drinking 8 glasses of water a day (64 ounces) Don't skip meals, especially breakfast, as skipping meals may alter your metabolism Do not use over-the-counter weight loss pills or gimmicks that claim rapid weight loss A healthy BMI (or body mass index) is between 18.5 and 24.9 You can calculate your ideal BMI at the NIH website Job[employer].huhttp://www.nhlbi.nih.gov/health/educational/lose_wt/BMI/bmicalc.htm  Caution: prolonged use of proton pump inhibitors like omeprazole (Prilosec), pantoprazole (Protonix), esomeprazole (Nexium), and others like Dexilant and Aciphex may increase your risk of pneumonia, Clostridium difficile colitis, osteoporosis, anemia and other health complications Try to limit or avoid triggers like coffee, caffeinated beverages, onions, chocolate, spicy foods, peppermint, acidic foods like pizza, spaghetti sauce, and orange juice Lose weight if you are overweight or obese Try elevating the head of your bed by placing a small wedge between  your mattress and box springs to keep acid in the stomach at night instead of coming up into your esophagus   Obesity, Adult Obesity is the condition of having too much total body fat. Being overweight or obese means that your weight is greater than what is considered healthy for your body size. Obesity is determined by a measurement called BMI. BMI is an estimate of body fat and is calculated from height and weight. For adults, a BMI of 30 or higher is considered obese. Obesity can eventually lead to other health concerns and major illnesses, including:  Stroke.  Coronary artery disease (CAD).  Type 2 diabetes.  Some types of cancer, including cancers of the colon, breast, uterus, and gallbladder.  Osteoarthritis.  High blood pressure (hypertension).  High cholesterol.  Sleep apnea.  Gallbladder stones.  Infertility problems. What are the causes? The main cause of obesity is taking in (consuming) more calories than your body uses for energy. Other factors that contribute to this condition may include:  Being born with genes that make you more likely to become obese.  Having a medical condition that causes obesity. These conditions include: ? Hypothyroidism. ? Polycystic ovarian syndrome (PCOS). ? Binge-eating disorder. ? Cushing syndrome.  Taking certain medicines, such as steroids, antidepressants, and seizure medicines.  Not being physically active (sedentary lifestyle).  Living where there are limited places to exercise safely or buy healthy foods.  Not getting enough sleep. What increases the risk? The following factors may increase your risk of this condition:  Having a family history of obesity.  Being a woman of African-American descent.  Being  a man of Hispanic descent. What are the signs or symptoms? Having excessive body fat is the main symptom of this condition. How is this diagnosed? This condition may be diagnosed based on:  Your  symptoms.  Your medical history.  A physical exam. Your health care provider may measure: ? Your BMI. If you are an adult with a BMI between 25 and less than 30, you are considered overweight. If you are an adult with a BMI of 30 or higher, you are considered obese. ? The distances around your hips and your waist (circumferences). These may be compared to each other to help diagnose your condition. ? Your skinfold thickness. Your health care provider may gently pinch a fold of your skin and measure it. How is this treated? Treatment for this condition often includes changing your lifestyle. Treatment may include some or all of the following:  Dietary changes. Work with your health care provider and a dietitian to set a weight-loss goal that is healthy and reasonable for you. Dietary changes may include eating: ? Smaller portions. A portion size is the amount of a particular food that is healthy for you to eat at one time. This varies from person to person. ? Low-calorie or low-fat options. ? More whole grains, fruits, and vegetables.  Regular physical activity. This may include aerobic activity (cardio) and strength training.  Medicine to help you lose weight. Your health care provider may prescribe medicine if you are unable to lose 1 pound a week after 6 weeks of eating more healthily and doing more physical activity.  Surgery. Surgical options may include gastric banding and gastric bypass. Surgery may be done if: ? Other treatments have not helped to improve your condition. ? You have a BMI of 40 or higher. ? You have life-threatening health problems related to obesity. Follow these instructions at home:  Eating and drinking   Follow recommendations from your health care provider about what you eat and drink. Your health care provider may advise you to: ? Limit fast foods, sweets, and processed snack foods. ? Choose low-fat options, such as low-fat milk instead of whole  milk. ? Eat 5 or more servings of fruits or vegetables every day. ? Eat at home more often. This gives you more control over what you eat. ? Choose healthy foods when you eat out. ? Learn what a healthy portion size is. ? Keep low-fat snacks on hand. ? Avoid sugary drinks, such as soda, fruit juice, iced tea sweetened with sugar, and flavored milk. ? Eat a healthy breakfast.  Drink enough water to keep your urine clear or pale yellow.  Do not go without eating for long periods of time (do not fast) or follow a fad diet. Fasting and fad diets can be unhealthy and even dangerous. Physical Activity  Exercise regularly, as told by your health care provider. Ask your health care provider what types of exercise are safe for you and how often you should exercise.  Warm up and stretch before being active.  Cool down and stretch after being active.  Rest between periods of activity. Lifestyle  Limit the time that you spend in front of your TV, computer, or video game system.  Find ways to reward yourself that do not involve food.  Limit alcohol intake to no more than 1 drink a day for nonpregnant women and 2 drinks a day for men. One drink equals 12 oz of beer, 5 oz of wine, or 1 oz of hard  liquor. General instructions  Keep a weight loss journal to keep track of the food you eat and how much you exercise you get.  Take over-the-counter and prescription medicines only as told by your health care provider.  Take vitamins and supplements only as told by your health care provider.  Consider joining a support group. Your health care provider may be able to recommend a support group.  Keep all follow-up visits as told by your health care provider. This is important. Contact a health care provider if:  You are unable to meet your weight loss goal after 6 weeks of dietary and lifestyle changes. This information is not intended to replace advice given to you by your health care provider.  Make sure you discuss any questions you have with your health care provider. Document Released: 04/25/2004 Document Revised: 08/21/2015 Document Reviewed: 01/04/2015 Elsevier Interactive Patient Education  2019 [employer].

## 2018-04-29 NOTE — Assessment & Plan Note (Signed)
Patient was referred to vascular in December but I do not see future appointment; will ask staff to check on that; continue aspirin and plavix (no evidence of abnormal bleeding on DAPT)

## 2018-04-29 NOTE — Assessment & Plan Note (Signed)
Chronic stable problem ?

## 2018-04-29 NOTE — Telephone Encounter (Signed)
Please check on the vascular referral that was entered for this patient in December I don't see an upcoming appointment and patient needs to get this scheduled Contact her with info so she can get that taken care of; thank you

## 2018-04-30 LAB — URINALYSIS W MICROSCOPIC + REFLEX CULTURE
Bacteria, UA: NONE SEEN /HPF
Bilirubin Urine: NEGATIVE
Glucose, UA: NEGATIVE
HYALINE CAST: NONE SEEN /LPF
Hgb urine dipstick: NEGATIVE
Ketones, ur: NEGATIVE
Leukocyte Esterase: NEGATIVE
Nitrites, Initial: NEGATIVE
Protein, ur: NEGATIVE
RBC / HPF: NONE SEEN /HPF (ref 0–2)
Specific Gravity, Urine: 1.022 (ref 1.001–1.03)
Squamous Epithelial / HPF: NONE SEEN /HPF (ref <=5)
WBC UA: NONE SEEN /HPF (ref 0–5)
pH: <=5 (ref 5.0–8.0)

## 2018-04-30 LAB — NO CULTURE INDICATED

## 2018-04-30 NOTE — Progress Notes (Signed)
Kristen Dudley, please let the patient know that her urine looked great; no evidence of infection or blood or protein; good news

## 2018-04-30 NOTE — Telephone Encounter (Signed)
Thank you :)

## 2018-04-30 NOTE — Telephone Encounter (Signed)
I will contact our referral coordinator to place new referral, but it looks like son declined.

## 2018-05-13 ENCOUNTER — Encounter

## 2018-05-13 ENCOUNTER — Encounter: Payer: Self-pay | Admitting: Podiatry

## 2018-05-13 ENCOUNTER — Ambulatory Visit: Payer: Medicare Other | Admitting: Podiatry

## 2018-05-13 VITALS — BP 129/67 | HR 94

## 2018-05-13 DIAGNOSIS — L84 Corns and callosities: Secondary | ICD-10-CM | POA: Diagnosis not present

## 2018-05-13 DIAGNOSIS — M205X1 Other deformities of toe(s) (acquired), right foot: Secondary | ICD-10-CM | POA: Diagnosis not present

## 2018-05-13 DIAGNOSIS — I739 Peripheral vascular disease, unspecified: Secondary | ICD-10-CM

## 2018-05-13 NOTE — Progress Notes (Signed)
This patient presents the office with chief complaint of a second toe right foot.  Patient states that this callus painful walking and wearing her shoes.  She says that it has become increasingly painful for the last few months.  She has provided no self treatment or sought any professional help.  She she presents the office today for an evaluation and treatment of her painful second toe right foot.  Complaint:  Visit Type: Patient returns to my office for continued preventative foot care services. Complaint: Patient states" my nails have grown long and thick and become painful to walk and wear shoes" Patient has been diagnosed with DM with no foot complications. The patient presents for preventative foot care services. No changes to ROS  Podiatric Exam: Vascular: dorsalis pedis and posterior tibial pulses are palpable bilateral. Capillary return is immediate. Temperature gradient is WNL. Skin turgor WNL  Sensorium: Normal Semmes Weinstein monofilament test. Normal tactile sensation bilaterally. Nail Exam: Pt has thick disfigured discolored second toenail right foot. Ulcer Exam: There is no evidence of ulcer or pre-ulcerative changes or infection. Orthopedic Exam: Muscle tone and strength are WNL. No limitations in general ROM. No crepitus or effusions noted. Contracture second toe right foot.. Bony prominences are unremarkable. Skin: No Porokeratosis. No infection or ulcers.  Clavi second toe right foot.  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    Treatment & Plan Procedures and Treatment: Consent by patient was obtained for treatment procedures.   Debridement of  Clavi second toe right. No ulceration, no infection noted.  Return Visit-Office Procedure: Patient instructed to return to the office for a follow up visit 3 months for continued evaluation and treatment.    Helane Gunther DPM

## 2018-06-26 ENCOUNTER — Telehealth: Payer: Self-pay | Admitting: Family Medicine

## 2018-06-26 ENCOUNTER — Ambulatory Visit (INDEPENDENT_AMBULATORY_CARE_PROVIDER_SITE_OTHER): Payer: Medicare Other | Admitting: Nurse Practitioner

## 2018-06-26 DIAGNOSIS — R42 Dizziness and giddiness: Secondary | ICD-10-CM

## 2018-06-26 DIAGNOSIS — J0100 Acute maxillary sinusitis, unspecified: Secondary | ICD-10-CM | POA: Diagnosis not present

## 2018-06-26 MED ORDER — AMOXICILLIN-POT CLAVULANATE 875-125 MG PO TABS: 1.0000 | ORAL_TABLET | Freq: Two times a day (BID) | ORAL | 0 refills | Status: DC

## 2018-06-26 MED ORDER — MECLIZINE HCL 12.5 MG PO TABS: 12.5000 mg | ORAL_TABLET | Freq: Three times a day (TID) | ORAL | 0 refills | Status: DC | PRN

## 2018-06-26 NOTE — Telephone Encounter (Signed)
Copied from CRM [id number]. Topic: Quick Communication - Rx Refill/Question >> Jun 26, 2018  1:12 PM Richarda Blade wrote: Medication: Patient is experiencing a sinus infection and an inner ear ache and would like Dr. Sherie Don to call her in something to take.    Preferred Pharmacy (with phone number or street name): [facility] [phone] [facility] Sheen, [place] - [address] [phone] (Phone) [phone] (Fax)    Agent: Please be advised that RX refills may take up to 3 business days. We ask that you follow-up with your pharmacy.

## 2018-06-26 NOTE — Progress Notes (Signed)
Virtual Visit via Telephone Note  I connected with Kristen Dudley on 06/26/18 at  4:00 PM EDT by telephone and verified that I am speaking with the correct person using two identifiers.   I discussed the limitations, risks, security and privacy concerns of performing an evaluation and management service by telephone and the availability of in person appointments.The patient expressed understanding and agreed to proceed. I am at my home office and the patient is at home with her daughter who is assisting with communication and providing additional feedback as patient is hard of hearing.    History of Present Illness: Patient feels she has had a sinus infection for a week, has had nasal congestion and sinus pressure and her teeth have been hurting. Denies fevers, chills. Endorses mild intermittent ear pain and congestion, states sometimes left ear and sometimes right ear. States this morning woke up and got out of bed and had some dizziness with position change. Has had this before and felt it was an inner ear issues. States dizziness lasts until she lays back down and then it resolves.    Denies weakness, slurred speech, vision changes, nausea.   Observations/Objective: Patient is hard of hearing Alert and oriented Patient endorses sinus pain with tapping in maxillary region Patient endorses dizziness upon standing that resolved when she laid back down.   Assessment and Plan: 1. Dizziness - meclizine (ANTIVERT) 12.5 MG tablet; Take 1 tablet (12.5 mg total) by mouth 3 (three) times daily as needed for dizziness.  Dispense: 30 tablet; Refill: 0  2. Acute non-recurrent maxillary sinusitis - amoxicillin-clavulanate (AUGMENTIN) 875-125 MG tablet; Take 1 tablet by mouth 2 (two) times daily.  Dispense: 20 tablet; Refill: 0   Follow Up Instructions: Discussed the limitations with a telephone visit, attempted to do a virtual visit without success. Discussed ER precautions and recommendation as it  is very limiting to evaluate dizziness in an elderly adult over the phone. Discussed risks and benefits of treating like vertigo and sinus infection vs proper evaluation. Daughter and patient aware and will trial medication if unimproved or worsening will go to ER.    I discussed the assessment and treatment plan with the patient. The patient was provided an opportunity to ask questions and all were answered. The patient agreed with the plan and demonstrated an understanding of the instructions.   The patient was advised to call back or seek an in-person evaluation if the symptoms worsen or if the condition fails to improve as anticipated.  I provided 15 minutes of non-face-to-face time during this encounter.   Cheryle Horsfall, NP

## 2018-06-29 NOTE — Telephone Encounter (Signed)
Pt already had virtual visit

## 2018-08-11 ENCOUNTER — Ambulatory Visit: Payer: Medicare Other | Admitting: Podiatry

## 2018-09-01 ENCOUNTER — Encounter: Payer: Self-pay | Admitting: Podiatry

## 2018-09-01 ENCOUNTER — Other Ambulatory Visit: Payer: Self-pay

## 2018-09-01 ENCOUNTER — Ambulatory Visit: Payer: Medicare Other | Admitting: Podiatry

## 2018-09-01 DIAGNOSIS — M79675 Pain in left toe(s): Secondary | ICD-10-CM | POA: Diagnosis not present

## 2018-09-01 DIAGNOSIS — M79674 Pain in right toe(s): Secondary | ICD-10-CM

## 2018-09-01 DIAGNOSIS — B351 Tinea unguium: Secondary | ICD-10-CM

## 2018-09-01 DIAGNOSIS — D689 Coagulation defect, unspecified: Secondary | ICD-10-CM | POA: Diagnosis not present

## 2018-09-01 DIAGNOSIS — R2681 Unsteadiness on feet: Secondary | ICD-10-CM

## 2018-09-01 NOTE — Progress Notes (Signed)
Complaint:  Visit Type: Patient returns to my office for continued preventative foot care services. Complaint: Patient states" my nails have grown long and thick and become painful to walk and wear shoes" Patient has been diagnosed with DM with no foot complications. The patient presents for preventative foot care services. No changes to ROS  Podiatric Exam: Vascular: dorsalis pedis and posterior tibial pulses are weakly palpable bilateral. Capillary return is immediate. Temperature gradient is WNL. Skin turgor WNL  Sensorium: Normal Semmes Weinstein monofilament test. Normal tactile sensation bilaterally. Nail Exam: Pt has thick disfigured discolored nails with subungual debris noted hallux nails  B/l. Ulcer Exam: There is no evidence of ulcer or pre-ulcerative changes or infection. Orthopedic Exam: Muscle tone and strength are WNL. No limitations in general ROM. No crepitus or effusions noted. Foot type and digits show no abnormalities. Bony prominences are unremarkable. Skin: No Porokeratosis. No infection or ulcers  Diagnosis:  Onychomycosis, , Pain in right toe, pain in left toes  Treatment & Plan Procedures and Treatment: Consent by patient was obtained for treatment procedures.   Debridement of mycotic and hypertrophic toenails, 1 through 5 bilateral and clearing of subungual debris. No ulceration, no infection noted. Patient says she has difficulty walking outside the home and has gait instability.  Therefore a walker is prescribed for this patient. Return Visit-Office Procedure: Patient instructed to return to the office for a follow up visit 3 months for continued evaluation and treatment.    Helane Gunther DPM

## 2018-12-04 ENCOUNTER — Ambulatory Visit: Payer: Medicare Other | Admitting: Podiatry

## 2018-12-04 ENCOUNTER — Encounter: Payer: Self-pay | Admitting: Podiatry

## 2018-12-04 ENCOUNTER — Other Ambulatory Visit: Payer: Self-pay

## 2018-12-04 DIAGNOSIS — I739 Peripheral vascular disease, unspecified: Secondary | ICD-10-CM | POA: Diagnosis not present

## 2018-12-04 DIAGNOSIS — D689 Coagulation defect, unspecified: Secondary | ICD-10-CM

## 2018-12-04 DIAGNOSIS — B351 Tinea unguium: Secondary | ICD-10-CM

## 2018-12-04 DIAGNOSIS — M79674 Pain in right toe(s): Secondary | ICD-10-CM

## 2018-12-04 DIAGNOSIS — M79675 Pain in left toe(s): Secondary | ICD-10-CM | POA: Diagnosis not present

## 2018-12-04 NOTE — Progress Notes (Signed)
Complaint:  Visit Type: Patient returns to my office for continued preventative foot care services. Complaint: Patient states" my nails have grown long and thick and become painful to walk and wear shoes" Patient has been diagnosed with DM with no foot complications. The patient presents for preventative foot care services. No changes to ROS  Podiatric Exam: Vascular: dorsalis pedis and posterior tibial pulses are weakly palpable bilateral. Capillary return is immediate. Temperature gradient is WNL. Skin turgor WNL  Sensorium: Normal Semmes Weinstein monofilament test. Normal tactile sensation bilaterally. Nail Exam: Pt has thick disfigured discolored nails with subungual debris noted hallux nails  B/l. Ulcer Exam: There is no evidence of ulcer or pre-ulcerative changes or infection. Orthopedic Exam: Muscle tone and strength are WNL. No limitations in general ROM. No crepitus or effusions noted. Hammer toes 2-4  B/L.  Plantar flexed metatarsals 2-4  B/L.Marland Kitchen Skin: No Porokeratosis. No infection or ulcers  Diagnosis:  Onychomycosis, , Pain in right toe, pain in left toes  Treatment & Plan Procedures and Treatment: Consent by patient was obtained for treatment procedures.   Debridement of mycotic and hypertrophic toenails, 1 through 5 bilateral and clearing of subungual debris. No ulceration, no infection noted.  Return Visit-Office Procedure: Patient instructed to return to the office for a follow up visit 3 months for continued evaluation and treatment.    Gardiner Barefoot DPM

## 2019-02-24 LAB — HM DIABETES EYE EXAM

## 2019-03-03 ENCOUNTER — Other Ambulatory Visit: Payer: Self-pay

## 2019-03-03 ENCOUNTER — Ambulatory Visit: Payer: Medicare Other | Admitting: Podiatry

## 2019-03-03 ENCOUNTER — Encounter: Payer: Self-pay | Admitting: Podiatry

## 2019-03-03 DIAGNOSIS — I739 Peripheral vascular disease, unspecified: Secondary | ICD-10-CM | POA: Diagnosis not present

## 2019-03-03 DIAGNOSIS — D689 Coagulation defect, unspecified: Secondary | ICD-10-CM | POA: Diagnosis not present

## 2019-03-03 DIAGNOSIS — M79675 Pain in left toe(s): Secondary | ICD-10-CM

## 2019-03-03 DIAGNOSIS — M79674 Pain in right toe(s): Secondary | ICD-10-CM

## 2019-03-03 DIAGNOSIS — B351 Tinea unguium: Secondary | ICD-10-CM | POA: Diagnosis not present

## 2019-03-03 NOTE — Progress Notes (Signed)
Complaint:  Visit Type: Patient returns to my office for continued preventative foot care services. Complaint: Patient states" my nails have grown long and thick and become painful to walk and wear shoes" Patient has been diagnosed with DM with no foot complications. The patient presents for preventative foot care services. No changes to ROS  Podiatric Exam: Vascular: dorsalis pedis and posterior tibial pulses are weakly palpable bilateral. Capillary return is immediate. Temperature gradient is WNL. Skin turgor WNL  Sensorium: Normal Semmes Weinstein monofilament test. Normal tactile sensation bilaterally. Nail Exam: Pt has thick disfigured discolored nails with subungual debris noted hallux nails  B/l. Ulcer Exam: There is no evidence of ulcer or pre-ulcerative changes or infection. Orthopedic Exam: Muscle tone and strength are WNL. No limitations in general ROM. No crepitus or effusions noted. Hammer toes 2-4  B/L.  Plantar flexed metatarsals 2-4  B/L.. Skin: No Porokeratosis. No infection or ulcers  Diagnosis:  Onychomycosis, , Pain in right toe, pain in left toes  Treatment & Plan Procedures and Treatment: Consent by patient was obtained for treatment procedures.   Debridement of mycotic and hypertrophic toenails, 1 through 5 bilateral and clearing of subungual debris. No ulceration, no infection noted.  Return Visit-Office Procedure: Patient instructed to return to the office for a follow up visit 3 months in [place]  for continued evaluation and treatment.    Lilas Diefendorf DPM 

## 2019-03-12 ENCOUNTER — Ambulatory Visit: Payer: Medicare Other | Admitting: Podiatry

## 2019-06-03 ENCOUNTER — Ambulatory Visit: Payer: Medicare Other | Admitting: Podiatry

## 2019-06-03 ENCOUNTER — Other Ambulatory Visit: Payer: Self-pay

## 2019-06-03 ENCOUNTER — Encounter: Payer: Self-pay | Admitting: Podiatry

## 2019-06-03 DIAGNOSIS — B351 Tinea unguium: Secondary | ICD-10-CM

## 2019-06-03 DIAGNOSIS — M79674 Pain in right toe(s): Secondary | ICD-10-CM

## 2019-06-03 DIAGNOSIS — I739 Peripheral vascular disease, unspecified: Secondary | ICD-10-CM | POA: Diagnosis not present

## 2019-06-03 DIAGNOSIS — M79675 Pain in left toe(s): Secondary | ICD-10-CM

## 2019-06-03 DIAGNOSIS — D689 Coagulation defect, unspecified: Secondary | ICD-10-CM | POA: Diagnosis not present

## 2019-06-03 NOTE — Progress Notes (Signed)
Complaint:  Visit Type: Patient returns to my office for continued preventative foot care services. Complaint: Patient states" my nails have grown long and thick and become painful to walk and wear shoes" Patient has been diagnosed with DM with no foot complications. The patient presents for preventative foot care services. No changes to ROS  Podiatric Exam: Vascular: dorsalis pedis and posterior tibial pulses are weakly palpable bilateral. Capillary return is immediate. Temperature gradient is WNL. Skin turgor WNL  Sensorium: Normal Semmes Weinstein monofilament test. Normal tactile sensation bilaterally. Nail Exam: Pt has thick disfigured discolored nails with subungual debris noted hallux nails  B/l. Ulcer Exam: There is no evidence of ulcer or pre-ulcerative changes or infection. Orthopedic Exam: Muscle tone and strength are WNL. No limitations in general ROM. No crepitus or effusions noted. Hammer toes 2-4  B/L.  Plantar flexed metatarsals 2-4  B/L.Marland Kitchen Skin: No Porokeratosis. No infection or ulcers  Diagnosis:  Onychomycosis, , Pain in right toe, pain in left toes  Treatment & Plan Procedures and Treatment: Consent by patient was obtained for treatment procedures.   Debridement of mycotic and hypertrophic toenails, 1 through 5 bilateral and clearing of subungual debris. No ulceration, no infection noted.  Return Visit-Office Procedure: Patient instructed to return to the office for a follow up visit 3 months in [place]  for continued evaluation and treatment.    Helane Gunther DPM

## 2019-09-06 ENCOUNTER — Ambulatory Visit: Payer: Medicare Other | Admitting: Podiatry

## 2019-09-10 ENCOUNTER — Encounter: Payer: Self-pay | Admitting: Physician Assistant

## 2019-09-13 ENCOUNTER — Ambulatory Visit: Payer: Medicare Other | Admitting: Podiatry

## 2019-09-16 ENCOUNTER — Ambulatory Visit: Payer: Medicare Other | Admitting: Podiatry

## 2019-09-16 ENCOUNTER — Other Ambulatory Visit: Payer: Self-pay

## 2019-09-16 ENCOUNTER — Encounter: Payer: Self-pay | Admitting: Podiatry

## 2019-09-16 ENCOUNTER — Ambulatory Visit: Payer: Medicare Other | Admitting: Family Medicine

## 2019-09-16 ENCOUNTER — Encounter: Payer: Self-pay | Admitting: Family Medicine

## 2019-09-16 VITALS — BP 132/72 | HR 87 | Temp 97.9°F | Resp 16 | Ht 62.0 in | Wt 172.5 lb

## 2019-09-16 DIAGNOSIS — H9203 Otalgia, bilateral: Secondary | ICD-10-CM

## 2019-09-16 DIAGNOSIS — R109 Unspecified abdominal pain: Secondary | ICD-10-CM

## 2019-09-16 DIAGNOSIS — J310 Chronic rhinitis: Secondary | ICD-10-CM

## 2019-09-16 DIAGNOSIS — B351 Tinea unguium: Secondary | ICD-10-CM

## 2019-09-16 DIAGNOSIS — M79674 Pain in right toe(s): Secondary | ICD-10-CM | POA: Diagnosis not present

## 2019-09-16 DIAGNOSIS — I739 Peripheral vascular disease, unspecified: Secondary | ICD-10-CM

## 2019-09-16 DIAGNOSIS — N183 Chronic kidney disease, stage 3 unspecified: Secondary | ICD-10-CM

## 2019-09-16 DIAGNOSIS — Z7689 Persons encountering health services in other specified circumstances: Secondary | ICD-10-CM

## 2019-09-16 DIAGNOSIS — D689 Coagulation defect, unspecified: Secondary | ICD-10-CM

## 2019-09-16 DIAGNOSIS — I714 Abdominal aortic aneurysm, without rupture, unspecified: Secondary | ICD-10-CM

## 2019-09-16 DIAGNOSIS — K649 Unspecified hemorrhoids: Secondary | ICD-10-CM | POA: Diagnosis not present

## 2019-09-16 DIAGNOSIS — E785 Hyperlipidemia, unspecified: Secondary | ICD-10-CM

## 2019-09-16 DIAGNOSIS — E669 Obesity, unspecified: Secondary | ICD-10-CM

## 2019-09-16 DIAGNOSIS — M79675 Pain in left toe(s): Secondary | ICD-10-CM

## 2019-09-16 MED ORDER — FLUTICASONE PROPIONATE 50 MCG/ACT NA SUSP: 2.0000 | Freq: Every day | NASAL | 6 refills | Status: DC

## 2019-09-16 MED ORDER — CLOPIDOGREL BISULFATE 75 MG PO TABS: 75.0000 mg | ORAL_TABLET | Freq: Every day | ORAL | 3 refills | Status: DC

## 2019-09-16 MED ORDER — FAMOTIDINE 20 MG PO TABS: 20.0000 mg | ORAL_TABLET | Freq: Two times a day (BID) | ORAL | 1 refills | Status: DC | PRN

## 2019-09-16 MED ORDER — ZYRTEC ALLERGY 10 MG PO TBDP: 10.0000 mg | ORAL_TABLET | Freq: Every day | ORAL | 1 refills | Status: DC

## 2019-09-16 MED ORDER — PANTOPRAZOLE SODIUM 40 MG PO TBEC: 40.0000 mg | DELAYED_RELEASE_TABLET | Freq: Every day | ORAL | 3 refills | Status: DC

## 2019-09-16 NOTE — Patient Instructions (Addendum)
Start treatment for acid reflux and see the info below  Protonix once in the morning on an empty stomach - do not eat for 30 + min afterwards  You can use pepcid as needed in addition to protonix  Follow up if not better in ~4 weeks  Start zyrtec and flonase (or other similar antihistamine and steroid nasal spray meds) daily to help with post-nasal drip/throat clearing.   Try to keep bowels soft with adequate fiber and fluids in diet You can use over the counter preparation H.   Follow up if not improving.    Food Choices for Gastroesophageal Reflux Disease, Adult When you have gastroesophageal reflux disease (GERD), the foods you eat and your eating habits are very important. Choosing the right foods can help ease your discomfort. Think about working with a nutrition specialist (dietitian) to help you make good choices. What are tips for following this plan?  Meals  Choose healthy foods that are low in fat, such as fruits, vegetables, whole grains, low-fat dairy products, and lean meat, fish, and poultry.  Eat small meals often instead of 3 large meals a day. Eat your meals slowly, and in a place where you are relaxed. Avoid bending over or lying down until 2-3 hours after eating.  Avoid eating meals 2-3 hours before bed.  Avoid drinking a lot of liquid with meals.  Cook foods using methods other than frying. Bake, grill, or broil food instead.  Avoid or limit: ? Chocolate. ? Peppermint or spearmint. ? Alcohol. ? Pepper. ? Black and decaffeinated coffee. ? Black and decaffeinated tea. ? Bubbly (carbonated) soft drinks. ? Caffeinated energy drinks and soft drinks.  Limit high-fat foods such as: ? Fatty meat or fried foods. ? Whole milk, cream, butter, or ice cream. ? Nuts and nut butters. ? Pastries, donuts, and sweets made with butter or shortening.  Avoid foods that cause symptoms. These foods may be different for everyone. Common foods that cause symptoms  include: ? Tomatoes. ? Oranges, lemons, and limes. ? Peppers. ? Spicy food. ? Onions and garlic. ? Vinegar. Lifestyle  Maintain a healthy weight. Ask your doctor what weight is healthy for you. If you need to lose weight, work with your doctor to do so safely.  Exercise for at least 30 minutes for 5 or more days each week, or as told by your doctor.  Wear loose-fitting clothes.  Do not smoke. If you need help quitting, ask your doctor.  Sleep with the head of your bed higher than your feet. Use a wedge under the mattress or blocks under the bed frame to raise the head of the bed. Summary  When you have gastroesophageal reflux disease (GERD), food and lifestyle choices are very important in easing your symptoms.  Eat small meals often instead of 3 large meals a day. Eat your meals slowly, and in a place where you are relaxed.  Limit high-fat foods such as fatty meat or fried foods.  Avoid bending over or lying down until 2-3 hours after eating.  Avoid peppermint and spearmint, caffeine, alcohol, and chocolate. This information is not intended to replace advice given to you by your health care provider. Make sure you discuss any questions you have with your health care provider. Document Revised: 07/09/2018 Document Reviewed: 04/23/2016 Elsevier Patient Education  2020 [employer].

## 2019-09-16 NOTE — Progress Notes (Signed)
Patient ID: Kristen Dudley, female    DOB: 01/02/1928, 86 y.o.   MRN: [id number]  PCP: No primary care provider on file.  Chief Complaint  Patient presents with  . Gastroesophageal Reflux    nausea, taking otc medication  . Otalgia    cant wear hearing aid  . Hemorrhoids    Subjective:   Kristen Dudley is a 86 y.o. female, presents to clinic with CC of the following:  Gastroesophageal Reflux She complains of abdominal pain, belching, coughing, dysphagia, early satiety, heartburn and nausea. She reports no chest pain, no choking, no globus sensation or no sore throat. This is a recurrent problem. The current episode started more than 1 year ago. The problem occurs frequently. The problem has been gradually worsening. The heartburn duration is more than one hour. The heartburn is located in the substernum. The heartburn is of moderate intensity. The heartburn does not wake her from sleep. The heartburn does not limit her activity. The heartburn doesn't change with position. The symptoms are aggravated by certain foods. Pertinent negatives include no anemia, fatigue, melena, muscle weakness, orthopnea or weight loss. Treatments tried: otc meds (for nausea)   Otalgia  There is pain in both ears. This is a recurrent problem. Episode frequency: intermittently when trying to wear hearing aids. There has been no fever. Associated symptoms include abdominal pain, coughing, hearing loss (chronic) and rhinorrhea. Pertinent negatives include no diarrhea, ear discharge, headaches, neck pain, rash, sore throat or vomiting. She has tried nothing for the symptoms. There is no history of a chronic ear infection or a tympanostomy tube.    Pt presents for abd pain- GERD sx and N with eating  Taking emetrol OTC very frequently she drinks it multiple times a day?  Active ingredients state fructose? And acid? She endorses more than 5 years of abdominal symptoms that have worsened over the last 6 months.  She  endorses intermittent pain that is moderate to severe in nature described as a burning located in her epigastrium and often radiating up to her neck and throat, triggered by greasy foods potato chips, her son is with her, Kristen HuaDavid he states she has a very poor diet right now not eating healthy foods and not avoiding any food triggers.  No changes or worsening when laying down late at night or first in the morning.  Kristen HuaDavid does note that she is clearing her throat a lot and sometimes after eating she will make noises and hawk up saliva and or mucus but nothing has been appearing like stomach contents and he does not believe that she has any true vomiting. She reports a little bit of constipation with some suspected chronic and intermittent hemorrhoids, she denies rectal plain or bright red blood per rectum, denies melena, hematochezia, hematemesis, unintentional weight loss, diarrhea, rash, fever, chills, sweats.  Regarding ear pain she is only having pain when she tries to wear her hearing aids which are several years old.  Her hearing has not changed she has not had any outer ear swelling any ear discharge, no recent trauma.  She does also endorse bad nasal allergies that worsen when [place], do believe that she only moved here about 2 years ago.  She is not on any medications for her nasal allergies.  She does endorse profuse nasal discharge congestion postnasal drip.   She is a prior pt of Dr. Sherie DonLada who left the practice over a year ago - pt has not  been seen in our clinic for over a year and she is new to me. She has hx of UTI - ESBL Escherichia coli in the past, labs, med hx, micro reviewed.  Reviewed past hospital admission in 2019 she was septic, CT scan of the abdomen pelvis noted dilated common hepatic and common bile duct, extensive aortic atherosclerosis and aneurysmal dilatation of the abdominal aorta to diameter of 3.8 cm recommended follow-up with ultrasound in 2 years she to  follow-up MRCP which was negative for choledocholithiasis or any other obstructing etiology  She has history of peripheral vascular disease, chronic kidney disease, her prior med list from over a year ago shows Plavix but no other statin medications, baby aspirin, I do not see any other vascular studies.  History of hyperlipidemia untreated likely due to age.     Patient Active Problem List   Diagnosis Date Noted  . Senile purpura (HCC) 04/29/2018  . Macular degeneration of both eyes 04/29/2018  . Mild acid reflux 04/29/2018  . Obesity (BMI 30.0-34.9) 04/29/2018  . CKD (chronic kidney disease) stage 3, GFR 30-59 ml/min 04/29/2018  . PVD (peripheral vascular disease) (HCC) 03/17/2018  . History of femur fracture 03/17/2018      Current Outpatient Medications:  .  clopidogrel (PLAVIX) 75 MG tablet, Take 75 mg by mouth daily., Disp: , Rfl:  .  amoxicillin-clavulanate (AUGMENTIN) 875-125 MG tablet, Take 1 tablet by mouth 2 (two) times daily. (Patient not taking: Reported on 09/16/2019), Disp: 20 tablet, Rfl: 0 .  aspirin EC 81 MG tablet, Take 81 mg by mouth daily. (Patient not taking: Reported on 09/16/2019), Disp: , Rfl:  .  meclizine (ANTIVERT) 12.5 MG tablet, Take 1 tablet (12.5 mg total) by mouth 3 (three) times daily as needed for dizziness. (Patient not taking: Reported on 09/16/2019), Disp: 30 tablet, Rfl: 0 .  mupirocin ointment (BACTROBAN) 2 %, Apply to areas on the toes twice a day (Patient not taking: Reported on 09/16/2019), Disp: 22 g, Rfl: 0   Allergies  Allergen Reactions  . Iohexol     "makes me pass out"     Social History   Tobacco Use  . Smoking status: Former Smoker    Packs/day: 0.50    Years: 50.00    Pack years: 25.00    Quit date: 05/01/2001    Years since quitting: 18.3  . Smokeless tobacco: Never Used  Vaping Use  . Vaping Use: Never used  Substance Use Topics  . Alcohol use: Never  . Drug use: Never      Chart Review Today: I personally reviewed  active problem list, medication list, allergies, family history, social history, health maintenance, notes from last encounter, lab results, imaging with the patient/caregiver today.   Review of Systems  Constitutional: Negative.  Negative for fatigue and weight loss.  HENT: Positive for ear pain, hearing loss (chronic) and rhinorrhea. Negative for ear discharge and sore throat.   Eyes: Negative.   Respiratory: Positive for cough. Negative for choking.   Cardiovascular: Negative.  Negative for chest pain.  Gastrointestinal: Positive for abdominal pain, dysphagia, heartburn and nausea. Negative for diarrhea, melena and vomiting.  Endocrine: Negative.   Genitourinary: Negative.   Musculoskeletal: Negative.  Negative for muscle weakness and neck pain.  Skin: Negative.  Negative for rash.  Allergic/Immunologic: Negative.   Neurological: Negative.  Negative for headaches.  Hematological: Negative.   Psychiatric/Behavioral: Negative.   All other systems reviewed and are negative.      Objective:  Vitals:   09/16/19 1342  BP: 132/72  Pulse: 87  Resp: 16  Temp: 97.9 F (36.6 C)  TempSrc: Temporal  SpO2: 95%  Weight: 172 lb 8 oz (78.2 kg)  Height: 5\' 2"  (1.575 m)    Body mass index is 31.55 kg/m.  Physical Exam Vitals and nursing note reviewed.  Constitutional:      General: She is not in acute distress.    Appearance: Normal appearance. She is well-developed. She is obese. She is not ill-appearing, toxic-appearing or diaphoretic.     Interventions: Face mask in place.     Comments: Well-appearing elderly female, appears younger than stated age, nontoxic appearing, pleasant and hard of hearing  HENT:     Head: Normocephalic and atraumatic.     Right Ear: Ear canal and external ear normal. Decreased hearing noted. No laceration, drainage, swelling or tenderness. No middle ear effusion. There is no impacted cerumen. Tympanic membrane is scarred. Tympanic membrane is not  injected, perforated, erythematous, retracted or bulging.     Left Ear: Tympanic membrane, ear canal and external ear normal. Decreased hearing noted. No laceration, drainage, swelling or tenderness.  No middle ear effusion. There is no impacted cerumen. Tympanic membrane is not injected or scarred.     Ears:     Comments: TMs bilaterally translucent with visible landmarks, right TM does have some scarring    Nose: Mucosal edema, congestion and rhinorrhea present.     Right Turbinates: Swollen.     Left Turbinates: Swollen.     Right Sinus: No maxillary sinus tenderness or frontal sinus tenderness.     Left Sinus: No maxillary sinus tenderness or frontal sinus tenderness.     Comments: Nasal mucosa diffusely erythematous with copious nasal discharge and congestion    Mouth/Throat:     Mouth: Mucous membranes are moist.     Pharynx: Posterior oropharyngeal erythema present. No oropharyngeal exudate.  Eyes:     General: Lids are normal. No scleral icterus.       Right eye: No discharge.        Left eye: No discharge.     Conjunctiva/sclera: Conjunctivae normal.  Neck:     Trachea: Phonation normal. No tracheal deviation.  Cardiovascular:     Rate and Rhythm: Normal rate and regular rhythm.     Pulses:          Radial pulses are 2+ on the right side and 2+ on the left side.       Posterior tibial pulses are 2+ on the right side and 2+ on the left side.     Heart sounds: Normal heart sounds. No murmur heard.  No friction rub. No gallop.   Pulmonary:     Effort: Pulmonary effort is normal. No respiratory distress.     Breath sounds: Normal breath sounds. No stridor. No wheezing, rhonchi or rales.  Chest:     Chest wall: No tenderness.  Abdominal:     General: Bowel sounds are normal. There is no distension.     Palpations: Abdomen is soft. There is no mass.     Tenderness: There is no abdominal tenderness. There is no right CVA tenderness, left CVA tenderness, guarding or rebound.      Hernia: No hernia is present.     Comments: Protuberant abdomen, normal bowel sounds x4 no tenderness to palpation  Musculoskeletal:     Right lower leg: No edema.     Left lower leg: No edema.  Skin:  General: Skin is warm and dry.     Coloration: Skin is not jaundiced or pale.     Findings: No rash.  Neurological:     Mental Status: She is alert. Mental status is at baseline.     Motor: No abnormal muscle tone.     Gait: Gait normal.  Psychiatric:        Mood and Affect: Mood normal.        Speech: Speech normal.        Behavior: Behavior normal.      Results for orders placed or performed in visit on 03/10/19  HM DIABETES EYE EXAM  Result Value Ref Range   HM Diabetic Eye Exam No Retinopathy No Retinopathy       Assessment & Plan:   1. Abdominal pain, unspecified abdominal location Abdominal pain is most suspicious for acid reflux/GERD with some possible gastritis or esophagitis, she also has some profuse nasal discharge and erythema to the nose and throat likely a good amount of postnasal drip that could also be contributing towards some of her nausea and GI symptoms.  She was encouraged to stop taking her over-the-counter medication due to its bizarre ingredients likely not to be helpful but possibly cause harm Encourage her to start Protonix in the morning on empty stomach for at least the next 2 to 4 weeks with close follow-up We will do H. pylori testing prior to starting PPI For any breakthrough reflux other times a day she can use Tums Mylanta or Pepcid twice daily as needed Encouraged her to work on diet and lifestyle, given handout about GERD diet and lifestyle changes.  Encouraged her to avoid greasy foods fried foods etc. - H. pylori breath test - pantoprazole (PROTONIX) 40 MG tablet; Take 1 tablet (40 mg total) by mouth daily.  Dispense: 30 tablet; Refill: 3 - famotidine (PEPCID) 20 MG tablet; Take 1 tablet (20 mg total) by mouth 2 (two) times daily as needed  for heartburn or indigestion.  Dispense: 60 tablet; Refill: 1  2. Hemorrhoids, unspecified hemorrhoid type When leaving room today patient briefly noted some hemorrhoids, when asked how long they have been there they have said for years.  She is having some constipation, encouraged her to soften stools with MiraLAX over-the-counter, adequate fiber in diet, pushing fluids and could try Preparation H over-the-counter ointment I do want her to come back in 1 month we can follow-up at that time  3. Otalgia of both ears Exam of ears bilaterally is fairly unremarkable.  She had some possible scarring to her right TM but they are clear bilaterally the canals are normal-appearing do not see any effusion, erythema, edema and she had no tenderness.  She has concerned about pain with her hearing aids, since there is no cerumen impaction or signs of infection she was encouraged to follow-up with her hearing aid manufacturer for reevaluation and fitting  4. Rhinitis, unspecified type She has profuse nasal discharge with nasal mucosal erythema and edema, she does not have any sinus tenderness to palpation so I doubt any acute bacterial sinusitis.  She has likely uncontrolled seasonal allergies.  Encouraged her to try starting a antihistamine at bedtime can even try half dose of Zyrtec Claritin or Allegra.  I also encouraged her to try Flonase or Nasonex Nasacort over-the-counter to decrease nasal symptoms, postnasal drip that are likely irritating her sinuses throat and upper GI system - Cetirizine HCl (ZYRTEC ALLERGY) 10 MG TBDP; Take 10 mg by mouth at  bedtime.  Dispense: 30 tablet; Refill: 1 - fluticasone (FLONASE) 50 MCG/ACT nasal spray; Place 2 sprays into both nostrils daily.  Dispense: 16 g; Refill: 6  5. Stage 3 chronic kidney disease, unspecified whether stage 3a or 3b CKD Patient had questions or concerns and brought in paperwork from over a year ago with renal disease highlighted.  Reviewed available labs  with the family today and noted that since obtaining care in our area in 2018 to her most recent labs which were over a year ago, she has had CKD stage III, educated about CKD stage III and encouraged to avoid NSAIDs and nephrotoxic medications, encouraged her to be well-hydrated, she has no diagnosis of hypertension and BP is normal today.  Explained that CKD is chronic, common, does not mean it will progress but can be due to normal aging of kidneys  6. PVD (peripheral vascular disease) ([place]) Patient is noncompliant with Plavix, she has not followed up with vascular specialist and was lost to follow-up and even care here.  She is having her son check her feet and legs daily she has history of stents to her legs done by vascular.  Explained what Plavix is for encouraged her to continue to take this.  7. Obesity (BMI 30.0-34.9)  8. Encounter to establish care with new doctor Patient new to me, prior PCP left and patient has not been seen for almost 16 months, lost to follow-up, has discontinued most of her medications and does not want any large interventions or procedures done.  We discussed her current age and health and goals to minimize medications and to optimize quality of life.  Prior labs diagnoses imaging hospitalization and office visit were reviewed extensively today by me in order to adequately get an outpatient and transition her to my panel patient's  9. Abdominal aortic aneurysm (AAA) without rupture (HCC) Is due for screening ultrasound repeated at the end of this year-per CT evaluation from 02/25/2018 -which showed excess extensive aortic atherosclerosis with aneurysmal dilatation of the abdominal aorta to a diameter of 3.8 cm recommended follow-up ultrasound in 2 years  10. Hyperlipidemia, unspecified hyperlipidemia type History of high cholesterol, last labs reflect this, patient not on statin, likely due to age, side effects of medication likely outweigh the benefits, discussed with  the patient and her son today   After extensive discussion with the patient and her son, reviewing her past medical care, reviewing goals of treatment and discussing her values and preferences, patient in a shared decision will not get labs today.  If she had abdominal tenderness I would urged for some blood work today but abdominal exam was fairly benign.  Likely this is GERD.  If he does not improve in the next 1 to 2 months we will reevaluate.  Did want obtain H. pylori testing today prior to use of PPI.  I explained the treatment for H. pylori if it is positive. Patient denied any urinary symptoms denied any Dudley suprapubic pain or pressure, flank or CVA pain or tenderness, no urinary frequency urgency or hematuria.  She is not had any vomiting or diarrhea.  Her appetite has been stable and she has been gaining weight so it is reassuring there is no unintentional weight loss.  She vaguely endorsed some discomfort in her central chest to her epigastric area with possible difficulty getting food pass in that area.  We will closely monitor this.  No red flags today with this elderly female, encouraged them to return in about  1 to 2 months for follow-up on abdominal pain and to continue to review and address her other chronic conditions.  Possibly could consult palliative care or CCM to help with goals of care or end-of-life planning.  Patient is very well-appearing does not appear her stated age she looks much younger.  When leaving the room today the patient mentioned that her son will not let her walk up and down the stairs and we did assist her up and down off the exam table today we will need to get her Medicare well visit done and we may need to address gait mobility in order to help her avoid falls etc.     Greater than 50% of this visit was spent in direct face-to-face counseling, obtaining history and physical, discussing and educating pt on treatment plan.  Total time of this visit was 50+ min.   Remainder of time involved but was not limited to reviewing chart (recent and pertinent OV notes and labs), documentation in EMR, and coordinating care and treatment plan.    Danelle Berry, PA-C 09/16/19 1:53 PM

## 2019-09-16 NOTE — Progress Notes (Signed)
This patient returns to my office for at risk foot care.  This patient requires this care by a professional since this patient will be at risk due to having chronic kidney disease, pvd, and coagulation defect.  Patient is taking plavix.  Patient  is unable to cut nails herself since the patient cannot reach her nails.These nails are painful walking and wearing shoes.  This patient presents for at risk foot care today.  General Appearance  Alert, conversant and in no acute stress.  Vascular  Dorsalis pedis and posterior tibial  pulses are weakly  palpable  bilaterally.  Capillary return is within normal limits  bilaterally. Temperature is within normal limits  bilaterally.  Neurologic  Senn-Weinstein monofilament wire test within normal limits  bilaterally. Muscle power within normal limits bilaterally.  Nails Thick disfigured discolored nails with subungual debris  Hallux nails  B/L.Marland Kitchen No evidence of bacterial infection or drainage bilaterally.  Orthopedic  No limitations of motion  feet .  No crepitus or effusions noted.  Hammer toes 2-4  B/L.  Plantar flexed metatarsals  2-4  B/L.  Skin  normotropic skin with no porokeratosis noted bilaterally.  No signs of infections or ulcers noted.     Onychomycosis  Pain in right toes  Pain in left toes  Consent was obtained for treatment procedures.   Mechanical debridement of nails 1-5  bilaterally performed with a nail nipper.  Filed with dremel without incident.    Return office visit   10 weeks                   Told patient to return for periodic foot care and evaluation due to potential at risk complications.   Helane Gunther DPM

## 2019-09-17 LAB — H. PYLORI BREATH TEST: H. pylori Breath Test: NOT DETECTED

## 2019-09-22 ENCOUNTER — Telehealth: Payer: Self-pay | Admitting: Family Medicine

## 2019-09-22 NOTE — Telephone Encounter (Signed)
Pt son Onalee Hua, given lab results per notes of Angelica Chessman, [place] on 09/17/19. Pt son verbalized understanding.

## 2019-10-14 ENCOUNTER — Ambulatory Visit: Payer: Medicare Other | Admitting: Physician Assistant

## 2019-11-08 ENCOUNTER — Other Ambulatory Visit: Payer: Self-pay | Admitting: Family Medicine

## 2019-11-08 DIAGNOSIS — R109 Unspecified abdominal pain: Secondary | ICD-10-CM

## 2019-11-22 ENCOUNTER — Telehealth: Payer: Self-pay | Admitting: Family Medicine

## 2019-11-22 NOTE — Telephone Encounter (Signed)
Copied from CRM [id number]. Topic: Medicare AWV >> Nov 22, 2019  2:09 PM Claudette Laws R wrote: Reason for CRM:  Left message for patient to call back and schedule Medicare Annual Wellness Visit (AWV) in office or virtually.  No hx of AWV  Please schedule at anytime with [facility] Health Advisor.      40 Minutes appointment   Any questions, please call me at [phone]

## 2019-11-25 ENCOUNTER — Ambulatory Visit: Payer: Medicare Other | Admitting: Podiatry

## 2019-12-02 ENCOUNTER — Ambulatory Visit: Payer: Medicare Other

## 2019-12-09 ENCOUNTER — Other Ambulatory Visit: Payer: Self-pay | Admitting: Family Medicine

## 2019-12-09 DIAGNOSIS — R109 Unspecified abdominal pain: Secondary | ICD-10-CM

## 2019-12-13 ENCOUNTER — Ambulatory Visit: Payer: Medicare Other | Admitting: Podiatry

## 2019-12-30 ENCOUNTER — Encounter: Payer: Self-pay | Admitting: Podiatry

## 2019-12-30 ENCOUNTER — Ambulatory Visit: Payer: Medicare Other | Admitting: Podiatry

## 2019-12-30 ENCOUNTER — Other Ambulatory Visit: Payer: Self-pay

## 2019-12-30 DIAGNOSIS — I739 Peripheral vascular disease, unspecified: Secondary | ICD-10-CM | POA: Diagnosis not present

## 2019-12-30 DIAGNOSIS — B351 Tinea unguium: Secondary | ICD-10-CM | POA: Diagnosis not present

## 2019-12-30 DIAGNOSIS — M79674 Pain in right toe(s): Secondary | ICD-10-CM

## 2019-12-30 DIAGNOSIS — M79675 Pain in left toe(s): Secondary | ICD-10-CM

## 2019-12-30 DIAGNOSIS — N183 Chronic kidney disease, stage 3 unspecified: Secondary | ICD-10-CM

## 2019-12-30 DIAGNOSIS — D689 Coagulation defect, unspecified: Secondary | ICD-10-CM

## 2019-12-30 NOTE — Progress Notes (Signed)
This patient returns to my office for at risk foot care.  This patient requires this care by a professional since this patient will be at risk due to having chronic kidney disease, pvd, and coagulation defect.  Patient is taking plavix.  Patient  is unable to cut nails herself since the patient cannot reach her nails.These nails are painful walking and wearing shoes.  This patient presents for at risk foot care today.  General Appearance  Alert, conversant and in no acute stress.  Vascular  Dorsalis pedis and posterior tibial  pulses are weakly  palpable  bilaterally.  Capillary return is within normal limits  bilaterally. Temperature is within normal limits  bilaterally.  Neurologic  Senn-Weinstein monofilament wire test within normal limits  bilaterally. Muscle power within normal limits bilaterally.  Nails Thick disfigured discolored nails with subungual debris  Hallux nails  B/L.Marland Kitchen No evidence of bacterial infection or drainage bilaterally.  Orthopedic  No limitations of motion  feet .  No crepitus or effusions noted.  Hammer toes 2-4  B/L.  Plantar flexed metatarsals  2-4  B/L.  Skin  normotropic skin with no porokeratosis noted bilaterally.  No signs of infections or ulcers noted.     Onychomycosis  Pain in right toes  Pain in left toes  Consent was obtained for treatment procedures.   Mechanical debridement of nails 1-5  bilaterally performed with a nail nipper.  Filed with dremel without incident.    Return office visit   9 weeks                   Told patient to return for periodic foot care and evaluation due to potential at risk complications.   Helane Gunther DPM

## 2020-01-04 ENCOUNTER — Telehealth: Payer: Self-pay | Admitting: Family Medicine

## 2020-01-04 NOTE — Telephone Encounter (Signed)
Left message for patient to call back and schedule Medicare Annual Wellness Visit (AWV) in office or virtually.  No hx of AWV eligible as of 06/30/12 per [employer]   Please schedule at anytime with [facility] Health Advisor.      40 Minutes appointment   Any questions, please call me at [phone]

## 2020-03-02 ENCOUNTER — Encounter: Payer: Self-pay | Admitting: Podiatry

## 2020-03-02 ENCOUNTER — Other Ambulatory Visit: Payer: Self-pay

## 2020-03-02 ENCOUNTER — Ambulatory Visit: Payer: Medicare Other | Admitting: Podiatry

## 2020-03-02 DIAGNOSIS — M79674 Pain in right toe(s): Secondary | ICD-10-CM | POA: Diagnosis not present

## 2020-03-02 DIAGNOSIS — N183 Chronic kidney disease, stage 3 unspecified: Secondary | ICD-10-CM

## 2020-03-02 DIAGNOSIS — B351 Tinea unguium: Secondary | ICD-10-CM

## 2020-03-02 DIAGNOSIS — I739 Peripheral vascular disease, unspecified: Secondary | ICD-10-CM

## 2020-03-02 DIAGNOSIS — M79675 Pain in left toe(s): Secondary | ICD-10-CM

## 2020-03-02 DIAGNOSIS — D689 Coagulation defect, unspecified: Secondary | ICD-10-CM

## 2020-03-02 NOTE — Progress Notes (Signed)
This patient returns to my office for at risk foot care.  This patient requires this care by a professional since this patient will be at risk due to having chronic kidney disease, pvd, and coagulation defect.  Patient is taking plavix.  Patient  is unable to cut nails herself since the patient cannot reach her nails.These nails are painful walking and wearing shoes.  This patient presents for at risk foot care today.  General Appearance  Alert, conversant and in no acute stress.  Vascular  Dorsalis pedis and posterior tibial  pulses are absent  bilaterally.  Capillary return is within normal limits  bilaterally. Cold feet.  bilaterally.  Neurologic  Senn-Weinstein monofilament wire test within normal limits  bilaterally. Muscle power within normal limits bilaterally.  Nails Thick disfigured discolored nails with subungual debris  Hallux nails  B/L.Marland Kitchen No evidence of bacterial infection or drainage bilaterally.  Orthopedic  No limitations of motion  feet .  No crepitus or effusions noted.  Hammer toes 2-4  B/L.  Plantar flexed metatarsals  2-4  B/L.  Skin  normotropic skin with no porokeratosis noted bilaterally.  No signs of infections or ulcers noted.     Onychomycosis  Pain in right toes  Pain in left toes  Consent was obtained for treatment procedures.   Mechanical debridement of nails 1-5  bilaterally performed with a nail nipper.  Filed with dremel without incident.    Return office visit   9 weeks                   Told patient to return for periodic foot care and evaluation due to potential at risk complications.   Helane Gunther DPM

## 2020-04-07 ENCOUNTER — Telehealth: Payer: Self-pay | Admitting: Family Medicine

## 2020-04-07 NOTE — Telephone Encounter (Signed)
Copied from CRM [id number]. Topic: Medicare AWV >> Apr 07, 2020  3:34 PM Claudette Laws R wrote: Reason for CRM:  Left message for patient to call back and schedule Medicare Annual Wellness Visit (AWV) in office.   If not able to come in office, please offer to do virtually.   No hx of AWV eligible as of 04/14/2012    Please schedule at anytime with [facility] Health Advisor.      40 Minutes appointment   Any questions, please call me at [phone]

## 2020-05-11 ENCOUNTER — Ambulatory Visit: Payer: Medicare Other | Admitting: Podiatry

## 2020-05-25 ENCOUNTER — Telehealth: Payer: Self-pay | Admitting: Family Medicine

## 2020-05-25 NOTE — Telephone Encounter (Signed)
Copied from CRM [id number]. Topic: Medicare AWV >> May 25, 2020  2:49 PM Claudette Laws R wrote: Reason for CRM:   No answer unable to leave amessage for patient to call back and schedule Medicare Annual Wellness Visit (AWV) in office.   If unable to come into the office for AWV,  please offer to do virtually or by telephone.  No hx of AWV eligible as of 06/30/12 awvi  Please schedule at anytime with [facility] Advisor.      40 Minutes appointment   Any questions, please call me at [phone]

## 2020-09-04 ENCOUNTER — Telehealth: Payer: Self-pay | Admitting: Family Medicine

## 2020-09-04 DIAGNOSIS — I739 Peripheral vascular disease, unspecified: Secondary | ICD-10-CM

## 2020-09-04 NOTE — Telephone Encounter (Signed)
Pt needs appt for further refills. 

## 2020-09-05 NOTE — Telephone Encounter (Signed)
lvm for pt to call the office back to schedule an appt

## 2020-09-21 ENCOUNTER — Ambulatory Visit: Payer: Medicare Other | Admitting: Unknown Physician Specialty

## 2020-10-18 ENCOUNTER — Other Ambulatory Visit: Payer: Self-pay | Admitting: Family Medicine

## 2020-10-18 DIAGNOSIS — R109 Unspecified abdominal pain: Secondary | ICD-10-CM

## 2020-10-25 ENCOUNTER — Other Ambulatory Visit: Payer: Self-pay | Admitting: Family Medicine

## 2020-10-25 DIAGNOSIS — R109 Unspecified abdominal pain: Secondary | ICD-10-CM

## 2020-11-16 ENCOUNTER — Observation Stay (HOSPITAL_COMMUNITY)
Admission: EM | Admit: 2020-11-16 | Discharge: 2020-11-17 | Disposition: A | Discharge status: Home / Self Care | Payer: Medicare Other | Attending: Emergency Medicine | Admitting: Emergency Medicine

## 2020-11-16 ENCOUNTER — Emergency Department (HOSPITAL_COMMUNITY): Payer: Medicare Other

## 2020-11-16 ENCOUNTER — Other Ambulatory Visit: Payer: Self-pay

## 2020-11-16 ENCOUNTER — Encounter (HOSPITAL_COMMUNITY): Payer: Self-pay | Admitting: Emergency Medicine

## 2020-11-16 ENCOUNTER — Observation Stay (HOSPITAL_COMMUNITY): Payer: Medicare Other

## 2020-11-16 DIAGNOSIS — I6523 Occlusion and stenosis of bilateral carotid arteries: Secondary | ICD-10-CM | POA: Diagnosis not present

## 2020-11-16 DIAGNOSIS — I129 Hypertensive chronic kidney disease with stage 1 through stage 4 chronic kidney disease, or unspecified chronic kidney disease: Secondary | ICD-10-CM | POA: Diagnosis not present

## 2020-11-16 DIAGNOSIS — R079 Chest pain, unspecified: Secondary | ICD-10-CM | POA: Diagnosis present

## 2020-11-16 DIAGNOSIS — G458 Other transient cerebral ischemic attacks and related syndromes: Secondary | ICD-10-CM | POA: Diagnosis not present

## 2020-11-16 DIAGNOSIS — R2681 Unsteadiness on feet: Secondary | ICD-10-CM | POA: Diagnosis not present

## 2020-11-16 DIAGNOSIS — Z7982 Long term (current) use of aspirin: Secondary | ICD-10-CM | POA: Diagnosis not present

## 2020-11-16 DIAGNOSIS — R072 Precordial pain: Secondary | ICD-10-CM | POA: Diagnosis not present

## 2020-11-16 DIAGNOSIS — I6529 Occlusion and stenosis of unspecified carotid artery: Secondary | ICD-10-CM

## 2020-11-16 DIAGNOSIS — Z20822 Contact with and (suspected) exposure to covid-19: Secondary | ICD-10-CM | POA: Diagnosis not present

## 2020-11-16 DIAGNOSIS — Z79899 Other long term (current) drug therapy: Secondary | ICD-10-CM | POA: Insufficient documentation

## 2020-11-16 DIAGNOSIS — I2510 Atherosclerotic heart disease of native coronary artery without angina pectoris: Secondary | ICD-10-CM | POA: Insufficient documentation

## 2020-11-16 DIAGNOSIS — Z87891 Personal history of nicotine dependence: Secondary | ICD-10-CM | POA: Insufficient documentation

## 2020-11-16 DIAGNOSIS — N183 Chronic kidney disease, stage 3 unspecified: Secondary | ICD-10-CM | POA: Insufficient documentation

## 2020-11-16 DIAGNOSIS — E785 Hyperlipidemia, unspecified: Secondary | ICD-10-CM | POA: Diagnosis present

## 2020-11-16 DIAGNOSIS — N1831 Chronic kidney disease, stage 3a: Secondary | ICD-10-CM | POA: Diagnosis present

## 2020-11-16 LAB — TROPONIN I (HIGH SENSITIVITY)
Troponin I (High Sensitivity): 13 ng/L (ref <18)
Troponin I (High Sensitivity): 39 ng/L — ABNORMAL HIGH (ref <18)
Troponin I (High Sensitivity): 66 ng/L — ABNORMAL HIGH (ref <18)
Troponin I (High Sensitivity): 67 ng/L — ABNORMAL HIGH (ref <18)

## 2020-11-16 LAB — BASIC METABOLIC PANEL
Anion gap: 9 (ref 5–15)
BUN: 21 mg/dL (ref 8–23)
CO2: 25 mmol/L (ref 22–32)
Calcium: 9.6 mg/dL (ref 8.9–10.3)
Chloride: 104 mmol/L (ref 98–111)
Creatinine, Ser: 1.32 mg/dL — ABNORMAL HIGH (ref 0.44–1.00)
GFR, Estimated: 38 mL/min — ABNORMAL LOW (ref >60)
Glucose, Bld: 133 mg/dL — ABNORMAL HIGH (ref 70–99)
Potassium: 3.9 mmol/L (ref 3.5–5.1)
Sodium: 138 mmol/L (ref 135–145)

## 2020-11-16 LAB — CBC
HCT: 48.3 % — ABNORMAL HIGH (ref 36.0–46.0)
Hemoglobin: 15.7 g/dL — ABNORMAL HIGH (ref 12.0–15.0)
MCH: 29.6 pg (ref 26.0–34.0)
MCHC: 32.5 g/dL (ref 30.0–36.0)
MCV: 91.1 fL (ref 80.0–100.0)
Platelets: 512 10*3/uL — ABNORMAL HIGH (ref 150–400)
RBC: 5.3 MIL/uL — ABNORMAL HIGH (ref 3.87–5.11)
RDW: 14.9 % (ref 11.5–15.5)
WBC: 10.3 10*3/uL (ref 4.0–10.5)
nRBC: 0 % (ref 0.0–0.2)

## 2020-11-16 LAB — HEPATIC FUNCTION PANEL
ALT: 17 U/L (ref 0–44)
AST: 29 U/L (ref 15–41)
Albumin: 4.1 g/dL (ref 3.5–5.0)
Alkaline Phosphatase: 81 U/L (ref 38–126)
Bilirubin, Direct: 0.5 mg/dL — ABNORMAL HIGH (ref 0.0–0.2)
Indirect Bilirubin: 1.3 mg/dL — ABNORMAL HIGH (ref 0.3–0.9)
Total Bilirubin: 1.8 mg/dL — ABNORMAL HIGH (ref 0.3–1.2)
Total Protein: 7.5 g/dL (ref 6.5–8.1)

## 2020-11-16 LAB — RESP PANEL BY RT-PCR (FLU A&B, COVID) ARPGX2
Influenza A by PCR: NEGATIVE
Influenza B by PCR: NEGATIVE
SARS Coronavirus 2 by RT PCR: NEGATIVE

## 2020-11-16 LAB — LIPASE, BLOOD: Lipase: 43 U/L (ref 11–51)

## 2020-11-16 LAB — CBG MONITORING, ED: Glucose-Capillary: 95 mg/dL (ref 70–99)

## 2020-11-16 MED ORDER — ASPIRIN EC 81 MG PO TBEC
81.0000 mg | DELAYED_RELEASE_TABLET | Freq: Every day | ORAL | Status: DC
  Administered 2020-11-16 – 2020-11-17 (×2): 81 mg via ORAL
  Filled 2020-11-16 (×2): qty 1

## 2020-11-16 MED ORDER — IOHEXOL 350 MG/ML SOLN
50.0000 mL | Freq: Once | INTRAVENOUS | Status: AC | PRN
  Administered 2020-11-16: 50 mL via INTRAVENOUS

## 2020-11-16 MED ORDER — ONDANSETRON HCL 4 MG PO TABS
4.0000 mg | ORAL_TABLET | Freq: Three times a day (TID) | ORAL | Status: DC | PRN
  Administered 2020-11-16 – 2020-11-17 (×2): 4 mg via ORAL
  Filled 2020-11-16 (×2): qty 1

## 2020-11-16 MED ORDER — NITROGLYCERIN 0.4 MG SL SUBL: 0.4000 mg | SUBLINGUAL_TABLET | Freq: Once | SUBLINGUAL | Status: DC

## 2020-11-16 MED ORDER — PANTOPRAZOLE SODIUM 40 MG PO TBEC
40.0000 mg | DELAYED_RELEASE_TABLET | Freq: Every day | ORAL | Status: DC
  Administered 2020-11-16 – 2020-11-17 (×2): 40 mg via ORAL
  Filled 2020-11-16 (×2): qty 1

## 2020-11-16 MED ORDER — SODIUM CHLORIDE 0.9% FLUSH
3.0000 mL | Freq: Two times a day (BID) | INTRAVENOUS | Status: DC
  Administered 2020-11-16: 3 mL via INTRAVENOUS

## 2020-11-16 MED ORDER — CLOPIDOGREL BISULFATE 75 MG PO TABS
75.0000 mg | ORAL_TABLET | Freq: Every day | ORAL | Status: DC
  Administered 2020-11-16 – 2020-11-17 (×2): 75 mg via ORAL
  Filled 2020-11-16 (×2): qty 1

## 2020-11-16 MED ORDER — ACETAMINOPHEN 325 MG PO TABS: 650.0000 mg | ORAL_TABLET | Freq: Four times a day (QID) | ORAL | Status: DC | PRN

## 2020-11-16 MED ORDER — ACETAMINOPHEN 650 MG RE SUPP: 650.0000 mg | Freq: Four times a day (QID) | RECTAL | Status: DC | PRN

## 2020-11-16 MED ORDER — AMLODIPINE BESYLATE 5 MG PO TABS
5.0000 mg | ORAL_TABLET | Freq: Every day | ORAL | Status: DC
  Administered 2020-11-17: 5 mg via ORAL
  Filled 2020-11-16: qty 1

## 2020-11-16 MED ORDER — ENOXAPARIN SODIUM 30 MG/0.3ML IJ SOSY
30.0000 mg | PREFILLED_SYRINGE | INTRAMUSCULAR | Status: DC
  Administered 2020-11-16: 30 mg via SUBCUTANEOUS
  Filled 2020-11-16: qty 0.3

## 2020-11-16 MED ORDER — ATORVASTATIN CALCIUM 10 MG PO TABS
20.0000 mg | ORAL_TABLET | Freq: Every day | ORAL | Status: DC
  Administered 2020-11-16: 20 mg via ORAL
  Filled 2020-11-16: qty 2

## 2020-11-16 NOTE — Hospital Course (Addendum)
Kristen Dudley is a 87 y.o. female with a medical history significant for peripheral vascular disease, essential hypertension not on antihypertensive medications, hyperlipidemia, tobacco use,  and symptomatic severe 70-99% stenosis of the left carotid artery admitted for chest pain.   #Chest pain, atypical #Hx of coronary artery disease Patient presented with acute onset substernal chest pain radiating to the right arm and right scapula that was relieved with nitroglycerin and aspirin. She did not have significant EKG changes but did have mild elevation in troponins 13>39>66. Cardiology consulted. Suspect her elevated troponins are in setting of severe hypertension. Recommend for goal directed medical therapy. Echo showed EF of 60-65% with some LVH, consistent with grade I diastolic dysfunction.   #TIA #Carotid artery stenosis Patient has a history of TIA's in setting of carotid artery stenosis with intermittent episodes of aphasia and confusion. Due to initial concerns for aphasia at home, CT Head wo Contrast obtained which was negative for acute intracranial abnormality. Patient's mental status was at baseline during my evaluation. However, noted to have acute confusion with aphasia for which Code Stroke activated. However, seems like these symptoms were transient and likely in setting of symptomatic carotid artery stenosis. MRI brain negative for acute intracranial pathology. Continued on aspirin, plavix, and atorvastatin therapy. Carotid ultrasound showing R ICA 1-39% stenosis and 60-79% stenosis of the L ICA, bilateral vertebral arteries demonstrate antegrade flow.   #Hypertension  Patient does not have any antihypertensives at home. She was noted to be hypertensive on exam. Suspect this may have contributed to her chest pain and she was started on amlodipine 5mg  daily. BP goal is normotensive for this patient.

## 2020-11-16 NOTE — Consult Note (Addendum)
Cardiology Consultation:   Patient ID: Kristen Dudley MRN: [id number]; DOB: [birth year]  Admit date: 11/16/2020 Date of Consult: 11/16/2020  PCP:  Kristen Dudley No   [facility] Providers Cardiologist:  New - Dr. Anne Fu     Patient Profile:   Kristen Dudley is a 87 y.o. female with a hx of hard of hearing, CKD stage III, PAD, AAA, HLD, carotid artery disease and history of CVA/TIA who is being seen 11/16/2020 for the evaluation of chest pain at the request of Dr. Criselda Peaches.  History of Present Illness:   Kristen Dudley is a 87 year old female with past medical history of hard of hearing, CKD stage III, PAD, AAA, HLD, carotid artery disease and history of CVA/TIA.  Patient has never seen a cardiologist.  She had lower extremity stenting in 2003 however has been noncompliant with Plavix therapy prior to May.  She ended up going to [facility] ED on 08/05/2020 after developing temporary aphasia while at the orthopedic doctors office.  She did not receive tPA due to resolution of symptoms.  CT of the head and neck showed moderate to severe 70-99% focal stenosis in the left ICA, mild proximal stenosis in the right ICA.  Echocardiogram showed EF 60 to 65% with no interatrial shunt.  Hemoglobin A1c 5.6.  LDL 62.  She was restarted on aspirin and Plavix along with 20 mg of Lipitor. MRI of the brain showed a couple of small acute/recent infarcts within the subcortical/deep white matter in the right frontal lobe.  She was diagnosed with TIA secondary to symptomatic severe carotid artery disease.  Her carotid artery disease was medically managed.     Patient was instructed to follow-up with neurology service as outpatient.  Patient was seen by neurologist Dr. Ansel Bong on 08/16/2020 at which time she was doing well.  Patient was in her usual state of health in the morning of 11/16/2020 when she awoke was significant left-sided chest pain radiating down the right arm and right shoulder blade around 1:30 AM.  The symptoms come and  go and last a few minutes each time.  Symptom eventually went away after she came to [facility] ED around 6 AM.  She is accompanied by her son at the bedside.  First troponin was 13, however subsequent troponin went up to 39.  Hemoglobin was 15.7.  Creatinine 1.32.  Talking with the patient, she has good functional ability, even though she does not do heavy exercise, she is able to take care of herself and is fairly independent at home doing cleaning and cooking.  EKG shows no obvious ST-T wave changes, poor R wave progression in the anterior leads.  Chest x-ray negative.  Initial head CT was negative.  Around 12 noon, her son noticed the patient was not answering questions clearly and appeared to be confused.  A code stroke was called, patient was rushed to the CT and had another head CT that came back negative.  CT of the neck is currently pending.  Cardiology service has been consulted regarding chest pain this morning.   Past Medical History:  Diagnosis Date   Hearing loss    PVD (peripheral vascular disease) (HCC)     Past Surgical History:  Procedure Laterality Date   ABDOMINAL HYSTERECTOMY     CATARACT EXTRACTION, BILATERAL  2007   CHOLECYSTECTOMY OPEN  1961   IR ERCP EXCH REM STENTS BIL PAN DUCT INC DIL EA STENT EXCH Right 2003   leg stents   ORIF FEMUR FRACTURE  Right 2014     Home Medications:  Prior to Admission medications   Medication Sig Start Date End Date Taking? Authorizing Provider  acetaminophen (TYLENOL) 500 MG tablet Take 1,000 mg by mouth every 6 (six) hours as needed for mild pain.   Yes [provider]  albuterol (VENTOLIN HFA) 108 (90 Base) MCG/ACT inhaler Inhale 2 puffs into the lungs every 6 (six) hours as needed for wheezing or shortness of breath. 12/07/19  Yes [provider]  aspirin EC 81 MG tablet Take 81 mg by mouth daily. Swallow whole.   Yes [provider]  atorvastatin (LIPITOR) 20 MG tablet Take 20 mg by mouth at bedtime. 08/07/20   Yes [provider]  clopidogrel (PLAVIX) 75 MG tablet TAKE 1 TABLET BY MOUTH EVERY DAY Patient taking differently: Take 75 mg by mouth daily. 09/04/20  Yes Danelle Berry, PA-C  magnesium oxide (MAG-OX) 400 (240 Mg) MG tablet Take 400 mg by mouth daily. 09/06/20  Yes [provider]  Omega-3 Fatty Acids (FISH OIL) 1000 MG CAPS Take 1,000 mg by mouth daily. 08/07/20  Yes [provider]  pantoprazole (PROTONIX) 40 MG tablet TAKE 1 TABLET BY MOUTH EVERY DAY Patient taking differently: Take 40 mg by mouth daily. 12/10/19  Yes Danelle Berry, PA-C    Inpatient Medications: Scheduled Meds:  aspirin EC  81 mg Oral Daily   atorvastatin  20 mg Oral QHS   clopidogrel  75 mg Oral Daily   enoxaparin (LOVENOX) injection  30 mg Subcutaneous Q24H   nitroGLYCERIN  0.4 mg Sublingual Once   pantoprazole  40 mg Oral Daily   sodium chloride flush  3 mL Intravenous Q12H   Continuous Infusions:  PRN Meds: acetaminophen **OR** acetaminophen  Allergies:    Allergies  Allergen Reactions   Codeine Other (See Comments)    agitation   Iohexol     "makes me pass out"  *Pt got IV contrast during a code stroke on 11/14/20 without any pre-meds and did just fine.    Social History:   Social History   Socioeconomic History   Marital status: Widowed    Spouse name: Not on file   Number of children: 2   Years of education: Not on file   Highest education level: Some college, no degree  Occupational History   Occupation: retired     Comment: Diplomatic Services operational officer   Tobacco Use   Smoking status: Former    Packs/day: 0.50    Years: 50.00    Pack years: 25.00    Types: Cigarettes    Quit date: 05/01/2001    Years since quitting: 19.5   Smokeless tobacco: Never  Vaping Use   Vaping Use: Never used  Substance and Sexual Activity   Alcohol use: Never   Drug use: Never   Sexual activity: Not Currently  Other Topics Concern   Not on file  Social History Narrative   She was completely  independent up to 2 years ago, but since than her sons decided to take her in. She was oldest son in [place] and moved to [place] since Summer 2019 to live with younger son   Social Determinants of Health   Financial Resource Strain: Not on file  Food Insecurity: Not on file  Transportation Needs: Not on file  Physical Activity: Not on file  Stress: Not on file  Social Connections: Not on file  Intimate Partner Violence: Not on file    Family History:    Family History  Problem Relation Age of Onset   Breast cancer Mother    Heart disease Father      ROS:  Please see the history of present illness.   All other ROS reviewed and negative.     Physical Exam/Data:   Vitals:   11/16/20 1145 11/16/20 1200 11/16/20 1242 11/16/20 1245  BP: (!) 203/81 (!) 208/96 (!) 189/72 (!) 179/83  Pulse: 86 85 89 88  Resp: 20 (!) 23 (!) 25 (!) 21  Temp:      TempSrc:      SpO2: 96% 98% 96% 97%  Weight:      Height:       No intake or output data in the 24 hours ending 11/16/20 1313 Last 3 Weights 11/16/2020 09/16/2019 04/29/2018  Weight (lbs) 172 lb 6.4 oz 172 lb 8 oz 169 lb 12.8 oz  Weight (kg) 78.2 kg 78.245 kg 77.021 kg     Body mass index is 31.53 kg/m.  General:  Well nourished, well developed, in no acute distress HEENT: normal Lymph: no adenopathy Neck: no JVD Endocrine:  No thryomegaly Vascular: No carotid bruits; FA pulses 2+ bilaterally without bruits  Cardiac:  normal S1, S2; RRR; no murmur  Lungs:  clear to auscultation bilaterally, no wheezing, rhonchi or rales  Abd: soft, nontender, no hepatomegaly  Ext: no edema Musculoskeletal:  No deformities, BUE and BLE strength normal and equal Skin: warm and dry  Neuro:  CNs 2-12 intact, no focal abnormalities noted Psych:  Normal affect   EKG:  The EKG was personally reviewed and demonstrates:  NSR with poor R wave progression in the anterior leads Telemetry:  Telemetry was personally reviewed and demonstrates:  NSR without  significant ventricular ectopy  Relevant CV Studies:  Echocardiogram 08/06/2020 Left Ventricle: Left ventricle size is normal.    Left Ventricle: There is mild concentric hypertrophy.    Left Ventricle: Systolic function is normal. EF: 60-65%.    Right Ventricle: Right ventricle was not well visualized. Right  ventricle is normal.    Right Ventricle: Systolic function is normal. Normal tricuspid annular  plane systolic excursion (TAPSE) >1.7 cm.    Left Atrium: Injection of agitated saline documents no interatrial  shunt.    Pericardium: There is no pericardial effusion.  Laboratory Data:  High Sensitivity Troponin:   Recent Labs  Lab 11/16/20 0616 11/16/20 0815  TROPONINIHS 13 39*     Chemistry Recent Labs  Lab 11/16/20 0616  NA 138  K 3.9  CL 104  CO2 25  GLUCOSE 133*  BUN 21  CREATININE 1.32*  CALCIUM 9.6  GFRNONAA 38*  ANIONGAP 9    No results for input(s): PROT, ALBUMIN, AST, ALT, ALKPHOS, BILITOT in the last 168 hours. Hematology Recent Labs  Lab 11/16/20 0616  WBC 10.3  RBC 5.30*  HGB 15.7*  HCT 48.3*  MCV 91.1  MCH 29.6  MCHC 32.5  RDW 14.9  PLT 512*   BNPNo results for input(s): BNP, PROBNP in the last 168 hours.  DDimer No results for input(s): DDIMER in the last 168 hours.   Radiology/Studies:  DG Chest 2 View  Result Date: 11/16/2020 CLINICAL DATA:  Chest pain EXAM: CHEST - 2 VIEW COMPARISON:  03/17/2018 FINDINGS: Normal heart size and mediastinal contours. No acute infiltrate or edema. Calcified nodules at the right base. No effusion or pneumothorax. No acute osseous findings. IMPRESSION: No active cardiopulmonary disease. Electronically Signed   By: Marnee Spring M.D.   On: 11/16/2020 06:54  CT HEAD WO CONTRAST ( )  Result Date: 11/16/2020 CLINICAL DATA:  Speech difficulty. EXAM: CT HEAD WITHOUT CONTRAST TECHNIQUE: Contiguous axial images were obtained from the base of the skull through the vertex without intravenous contrast.  COMPARISON:  February 25, 2018. FINDINGS: Brain: Mild chronic ischemic white matter disease is noted. No mass effect or midline shift is noted. Ventricular size is within normal limits. There is no evidence of mass lesion, hemorrhage or acute infarction. Vascular: No hyperdense vessel or unexpected calcification. Skull: Normal. Negative for fracture or focal lesion. Sinuses/Orbits: No acute finding. Other: None. IMPRESSION: No acute intracranial abnormality seen. Electronically Signed   By: Lupita Raider M.D.   On: 11/16/2020 09:20   CT HEAD CODE STROKE WO CONTRAST  Result Date: 11/16/2020 CLINICAL DATA:  Code stroke. EXAM: CT HEAD WITHOUT CONTRAST TECHNIQUE: Contiguous axial images were obtained from the base of the skull through the vertex without intravenous contrast. COMPARISON:  11/16/2020 8:55 a.m. FINDINGS: Brain: No evidence of acute infarction, hemorrhage, hydrocephalus, extra-axial collection or mass lesion/mass effect. Periventricular white matter changes, likely the sequela of chronic small vessel ischemic disease. Vascular: No hyperdense vessel or unexpected calcification. Skull: Normal. Negative for fracture or focal lesion. Sinuses/Orbits: Mucosal thickening in the right maxillary sinus and ethmoid air cells. Status post bilateral lens replacements. Other: Well aerated mastoids. ASPECTS [facility] Stroke Program Early CT Score) - Ganglionic level infarction (caudate, lentiform nuclei, internal capsule, insula, M1-M3 cortex): 7 - Supraganglionic infarction (M4-M6 cortex): 3 Total score (0-10 with 10 being normal): 10 IMPRESSION: 1. No acute intracranial process. 2. ASPECTS is 10 Code stroke imaging results were communicated on 11/16/2020 at 12:38 pm to provider Amada Jupiter via secure text paging. Electronically Signed   By: Wiliam Ke M.D.   On: 11/16/2020 12:39     Assessment and Plan:   Chest pain: Mildly elevated troponin  -Serial troponin was trending up.  13->39.  Continue with  additional serial troponin. -She has been hypertensive with systolic blood pressure in the 1 80-200 range in the emergency room.  However uptrending of the troponin along with chest discomfort this morning was concerning. -She is 87 years old, however she has been able to be fairly independent at home.  May be a candidate for Myoview as long as the troponin does not continue to trend up. Repeat echo  Confusion: Code stroke called around noon.  Head CT negative  -Symptom appears to have resolved.  Neck CT is pending, expect neck CT to be abnormal showing the same severe left carotid artery disease that was seen back in May.  Head CT shows no acute changes.  -Continue Plavix  Significant carotid artery disease: Diagnosed in May 2022 when the patient presented to [facility] with possible TIA/CVA.  She was eventually diagnosed with symptomatic left carotid artery stenosis.  This was managed medically.  History of CVA/TIA: MRI obtained in May 2022 showed a couple of small acute/recent infarcts within the subcortical/deep white matter in the right frontal lobe.  Her aphasia quickly resolved at the time and was eventually attributed to possible symptomatic severe left carotid artery stenosis.  AAA: Last CTA of abdomen obtained in November 2019 showed dilated abdominal aorta measuring 3.9 cm.  Hypertension: She does not have a prior diagnosis of hypertension.  She is not on blood pressure medication at home.  Systolic blood pressure in the 180-200 range in the emergency room after she presented with chest pain.  Will need antihypertensive medication.  Hyperlipidemia: On Lipitor 20  mg daily.  PAD: History of lower extremity stenting in 2003.  CKD stage III: Stable lab work   Risk Assessment/Risk Scores:     HEAR Score (for undifferentiated chest pain):  HEAR Score: 6          For questions or updates, please contact [facility] Please consult [email or web address] for contact info under     Signed, Azalee CourseHao Meng, PA  11/16/2020 1:13 PM  Personally seen and examined. Agree with above.  87 year old patient with known peripheral vascular disease, mild AAA 3.9 cm, history of stroke TIA who ended up having chest discomfort earlier this morning.  Troponin increased from 13 up to 39.  Additional troponin currently pending.  Mildly elevated at this time.  She also had hypertension with systolics in the 200 range at 1 point.   Spoke at length to her son Salvatore MarvelDavid Standlee.  He was very aware of her speech difficulties, confusion and chest discomfort earlier today.  It has been a challenge for him to know when he needs to bring her to the emergency room.  Currently comfortable.  Heart regular rate and rhythm, lungs are clear.  She is being transported to MRI.  Assessment and plan:  87 year old with chest pain, known peripheral vascular disease  Chest pain - She certainly could have underlying coronary artery disease however troponin elevation is very mild and most likely compatible with demand ischemia in the setting of severe hypertension. - If chest pain were to return, we could certainly consider potential stress testing however I would be a proponent of this in the outpatient setting unless symptoms progress significantly.  Ultimately, a noninvasive approach/conservative approach makes sense for her. - Continue with goal-directed medical therapy for hyperlipidemia peripheral arterial disease and hypertension. -Echocardiogram has been ordered. EKG shows sinus rhythm without any ischemic changes. -If chest pain were to return, we could consider utilizing long-acting nitrate such as isosorbide 30 mg once a day.  Code stroke - Earlier had confusion head CT was negative.  Seems to be back to baseline.  She has severe left carotid artery disease.  Continue with Plavix.  Treat medically. -Her son Onalee HuaDavid, would like to have guidance on when to activate EMS or not.  This can be very challenging for  family members in this situation.  Perhaps neurology guidance can be given or even goals of care discussion with palliative care may be helpful.   Donato SchultzMark Nattie Lazenby, MD

## 2020-11-16 NOTE — ED Notes (Addendum)
The patient's family member called out to the nurses' station stating that the patient was having sharp pain in her left shoulder blade. The patient stated that the pain started in the more lateral region of her shoulder blade, but now had migrated to be more centrally located in the left shoulder blade. The patient rated her pain 8 out 10, stating that, "if it made her holler out, it was bad." This EMT placed secondary cardiac monitoring cords on the patient and ran an EKG. This EMT informed Emily-RN, and informed Sheldon-MD of the situation as well as showed him the EKG for the patient.

## 2020-11-16 NOTE — ED Notes (Signed)
The patient family member called out to the nurses' station stating the patient was, "a little despondent." The family expressed that the patient felt like, "a clot was striking." The patient appeared to be some distress, with a general look of discomfort. The patient was oriented to date and how many quarters make a dollar. This EMT asked the patient if she knew if [employer] was a dog or a cat to which the patient responded, "Everyone knows [employer]... a cat." This EMT asked the patient to squeeze the EMT's hands. Strength was slightly diminished on the right side. The patient did not have facial droop when asked to smile. The patient asked for a wet wash cloth to go on her forehead. Kristen Dudley was informed of the situation.

## 2020-11-16 NOTE — ED Notes (Signed)
Notified that pt confused and not acting right. Came in to check on pt and pt mildly aphasic and having delayed responses. Notified admitting team.

## 2020-11-16 NOTE — ED Notes (Signed)
Attempted report x1. 

## 2020-11-16 NOTE — ED Provider Notes (Signed)
[facility]   CSN: [id number] Arrival date & time: 11/16/20  0606     History Chief Complaint  Patient presents with   Chest Pain    Kristen Dudley is a 87 y.o. female.  Patient is a 87 year old female with a past medical history of CKD, peripheral vascular disease, HLD, History of TIA, symptomatic stenosis of left carotid artery who is presenting for chest pain that started at around 3:45 AM this morning.  Patient was lying in bed when she began stating that she had 8 out of 10 chest pain that was radiating down her right arm.  She denied having shortness of breath.  She experienced nausea and diaphoresis with this.  She was given nitro and aspirin by EMS with relief of pain.  She currently states that the pain is improved.  She takes aspirin and Plavix.  Her son reports that when she was complaining of chest pain she was pointing to her chest and had word finding difficulty. Her son stated that her blood pressure was elevated at this time and was consistent with prior episodes of aphasia. She currently feels at her mental baseline and her son states that she is at her mental baseline. She has no weakness, numbness or aphasia at this time.   Chest Pain Associated symptoms: no abdominal pain, no cough, no diaphoresis, no dizziness, no dysphagia, no fatigue, no fever, no headache, no nausea, no numbness, no palpitations, no shortness of breath, no vomiting and no weakness    HPI: A 87 year old patient with a history of peripheral artery disease and hypercholesterolemia presents for evaluation of chest pain. Initial onset of pain was approximately 3-6 hours ago. The patient's chest pain is well-localized, is described as heaviness/pressure/tightness, is sharp, is not worse with exertion and is relieved by nitroglycerin. The patient complains of nausea and reports some diaphoresis. The patient's chest pain is middle- or left-sided and does  radiate to the arms/jaw/neck. The patient has no history of stroke, has not smoked in the past 90 days, denies any history of treated diabetes, has no relevant family history of coronary artery disease (first degree relative at less than age 85), is not hypertensive and does not have an elevated BMI (>=30).   Past Medical History:  Diagnosis Date   Hearing loss    PVD (peripheral vascular disease) (HCC)     Patient Active Problem List   Diagnosis Date Noted   Hyperlipidemia 09/16/2019   Abdominal aortic aneurysm (AAA) without rupture (HCC) 09/16/2019   Senile purpura (HCC) 04/29/2018   Macular degeneration of both eyes 04/29/2018   Mild acid reflux 04/29/2018   Obesity (BMI 30.0-34.9) 04/29/2018   Stage 3 chronic kidney disease (HCC) 04/29/2018   PVD (peripheral vascular disease) (HCC) 03/17/2018   History of femur fracture 03/17/2018    Past Surgical History:  Procedure Laterality Date   ABDOMINAL HYSTERECTOMY     CATARACT EXTRACTION, BILATERAL  2007   CHOLECYSTECTOMY OPEN  1961   IR ERCP EXCH REM STENTS BIL PAN DUCT INC DIL EA STENT EXCH Right 2003   leg stents   ORIF FEMUR FRACTURE Right 2014     OB History   No obstetric history on file.     Family History  Problem Relation Age of Onset   Breast cancer Mother    Heart disease Father     Social History   Tobacco Use   Smoking status: Former    Packs/day: 0.50  Years: 50.00    Pack years: 25.00    Types: Cigarettes    Quit date: 05/01/2001    Years since quitting: 19.5   Smokeless tobacco: Never  Vaping Use   Vaping Use: Never used  Substance Use Topics   Alcohol use: Never   Drug use: Never    Home Medications Prior to Admission medications   Medication Sig Start Date End Date Taking? Authorizing Provider  albuterol (VENTOLIN HFA) 108 (90 Base) MCG/ACT inhaler Inhale 2 puffs into the lungs every 6 (six) hours as needed. 12/07/19   [provider]  aspirin 325 MG EC tablet Take 325 mg by mouth  daily. 12/07/19   [provider]  BREO ELLIPTA 200-25 MCG/INH AEPB 1 puff daily. 12/17/19   [provider]  Cetirizine HCl (ZYRTEC ALLERGY) 10 MG TBDP Take 10 mg by mouth at bedtime. 09/16/19   Danelle Berry, PA-C  clopidogrel (PLAVIX) 75 MG tablet TAKE 1 TABLET BY MOUTH EVERY DAY 09/04/20   Danelle Berry, PA-C  [employer] VITAMIN C 1000 MG tablet Take 2,000 mg by mouth daily. 12/07/19   [provider]  famotidine (PEPCID) 20 MG tablet Take 1 tablet (20 mg total) by mouth 2 (two) times daily as needed for heartburn or indigestion. 09/16/19   Danelle Berry, PA-C  fluticasone (FLONASE) 50 MCG/ACT nasal spray Place 2 sprays into both nostrils daily. 09/16/19   Danelle Berry, PA-C  pantoprazole (PROTONIX) 40 MG tablet TAKE 1 TABLET BY MOUTH EVERY DAY 12/10/19   Danelle Berry, PA-C  Zinc 50 MG TABS Take 1 tablet by mouth daily. 12/07/19   [provider]    Allergies    Iohexol  Review of Systems   Review of Systems  Constitutional:  Negative for chills, diaphoresis, fatigue and fever.  HENT:  Negative for congestion, dental problem, ear discharge, ear pain, facial swelling, hearing loss, nosebleeds, postnasal drip, rhinorrhea, sinus pain, sneezing, sore throat and trouble swallowing.   Eyes:  Negative for pain and visual disturbance.  Respiratory:  Negative for cough, chest tightness, shortness of breath, wheezing and stridor.   Cardiovascular:  Positive for chest pain. Negative for palpitations and leg swelling.  Gastrointestinal:  Negative for abdominal distention, abdominal pain, blood in stool, constipation, diarrhea, nausea and vomiting.  Endocrine: Negative for polydipsia and polyuria.  Genitourinary:  Negative for difficulty urinating, dysuria, flank pain, frequency, hematuria, urgency, vaginal bleeding and vaginal discharge.  Musculoskeletal:  Negative for myalgias, neck pain and neck stiffness.  Skin:  Negative for rash and wound.  Allergic/Immunologic: Negative for  environmental allergies and food allergies.  Neurological:  Negative for dizziness, seizures, syncope, facial asymmetry, speech difficulty, weakness, light-headedness, numbness and headaches.  Psychiatric/Behavioral:  Negative for agitation, behavioral problems and confusion.    Physical Exam Updated Vital Signs BP (!) 188/84   Pulse 77   Temp 97.8 F (36.6 C) (Oral)   Resp 16   Ht 5\' 2"  (1.575 m)   Wt 78.2 kg   SpO2 96%   BMI 31.53 kg/m   Physical Exam Vitals and nursing note reviewed.  Constitutional:      General: She is not in acute distress.    Appearance: Normal appearance. She is normal weight. She is not ill-appearing.  HENT:     Head: Normocephalic and atraumatic.     Right Ear: External ear normal.     Left Ear: External ear normal.     Nose: Nose normal. No congestion.     Mouth/Throat:  Mouth: Mucous membranes are moist.     Pharynx: Oropharynx is clear. No oropharyngeal exudate or posterior oropharyngeal erythema.  Eyes:     General: No visual field deficit.    Extraocular Movements: Extraocular movements intact.     Conjunctiva/sclera: Conjunctivae normal.     Pupils: Pupils are equal, round, and reactive to light.  Neck:     Vascular: No carotid bruit.  Cardiovascular:     Rate and Rhythm: Normal rate and regular rhythm.     Pulses: Normal pulses.     Heart sounds: Normal heart sounds. No murmur heard. Pulmonary:     Effort: Pulmonary effort is normal. No respiratory distress.     Breath sounds: Normal breath sounds. No stridor. No wheezing, rhonchi or rales.  Chest:     Chest wall: No tenderness.  Abdominal:     General: Bowel sounds are normal. There is no distension.     Palpations: Abdomen is soft.     Tenderness: There is no abdominal tenderness. There is no right CVA tenderness, left CVA tenderness, guarding or rebound.  Musculoskeletal:        General: No swelling or tenderness. Normal range of motion.     Cervical back: Normal range of  motion and neck supple. No rigidity, tenderness or bony tenderness.     Thoracic back: Normal. No tenderness or bony tenderness.     Lumbar back: Normal. No tenderness or bony tenderness.     Right lower leg: No edema.     Left lower leg: No edema.  Skin:    General: Skin is warm and dry.     Coloration: Skin is not jaundiced.  Neurological:     General: No focal deficit present.     Mental Status: She is alert and oriented to person, place, and time. Mental status is at baseline.     Cranial Nerves: Cranial nerves are intact. No cranial nerve deficit, dysarthria or facial asymmetry.     Sensory: Sensation is intact. No sensory deficit.     Motor: Motor function is intact. No weakness.     Coordination: Coordination is intact. Finger-Nose-Finger Test normal.     Gait: Gait is intact. Gait normal.  Psychiatric:        Mood and Affect: Mood normal.        Behavior: Behavior normal.        Thought Content: Thought content normal.        Judgment: Judgment normal.    ED Results / Procedures / Treatments   Labs (all labs ordered are listed, but only abnormal results are displayed) Labs Reviewed  BASIC METABOLIC PANEL - Abnormal; Notable for the following components:      Result Value   Glucose, Bld 133 (*)    Creatinine, Ser 1.32 (*)    GFR, Estimated 38 (*)    All other components within normal limits  CBC - Abnormal; Notable for the following components:   RBC 5.30 (*)    Hemoglobin 15.7 (*)    HCT 48.3 (*)    Platelets 512 (*)    All other components within normal limits  TROPONIN I (HIGH SENSITIVITY) - Abnormal; Notable for the following components:   Troponin I (High Sensitivity) 39 (*)    All other components within normal limits  RESP PANEL BY RT-PCR (FLU A&B, COVID) ARPGX2  LIPASE, BLOOD  HEPATIC FUNCTION PANEL  CBG MONITORING, ED  TROPONIN I (HIGH SENSITIVITY)  TROPONIN I (HIGH SENSITIVITY)  TROPONIN I (  HIGH SENSITIVITY)    EKG EKG  Interpretation  Date/Time:  Thursday November 16 2020 06:17:23 EDT Ventricular Rate:  81 PR Interval:  142 QRS Duration: 88 QT Interval:  390 QTC Calculation: 453 R Axis:   28 Text Interpretation: Normal sinus rhythm Low voltage QRS Cannot rule out Anterior infarct , age undetermined Abnormal ECG No significant change since last tracing Confirmed by Susy Frizzle [phone]) on 11/16/2020 7:36:57 AM  Radiology DG Chest 2 View  Result Date: 11/16/2020 CLINICAL DATA:  Chest pain EXAM: CHEST - 2 VIEW COMPARISON:  03/17/2018 FINDINGS: Normal heart size and mediastinal contours. No acute infiltrate or edema. Calcified nodules at the right base. No effusion or pneumothorax. No acute osseous findings. IMPRESSION: No active cardiopulmonary disease. Electronically Signed   By: Marnee Spring M.Kristen.   On: 11/16/2020 06:54   CT HEAD WO CONTRAST ( )  Result Date: 11/16/2020 CLINICAL DATA:  Speech difficulty. EXAM: CT HEAD WITHOUT CONTRAST TECHNIQUE: Contiguous axial images were obtained from the base of the skull through the vertex without intravenous contrast. COMPARISON:  February 25, 2018. FINDINGS: Brain: Mild chronic ischemic white matter disease is noted. No mass effect or midline shift is noted. Ventricular size is within normal limits. There is no evidence of mass lesion, hemorrhage or acute infarction. Vascular: No hyperdense vessel or unexpected calcification. Skull: Normal. Negative for fracture or focal lesion. Sinuses/Orbits: No acute finding. Other: None. IMPRESSION: No acute intracranial abnormality seen. Electronically Signed   By: Lupita Raider M.Kristen.   On: 11/16/2020 09:20    Procedures Procedures   Medications Ordered in ED Medications - No data to display  ED Course  I have reviewed the triage vital signs and the nursing notes.  Pertinent labs & imaging results that were available during my care of the patient were reviewed by me and considered in my medical decision making (see  chart for details).    MDM Rules/Calculators/A&P HEAR Score: 6                       DARIELYS Dudley is a 87 y.o. female with a past medical history of CKD, peripheral vascular disease, HLD, History of TIA, symptomatic stenosis of left carotid artery is presenting for chest pain that started at around 3:45 AM this morning.  On arrival patient is hemodynamically stable and in no acute distress.  Patient is complaining of chest pain located in the middle of her chest that is rating down her right arm.  Son also reports that when she was complaining of chest pain she had difficulty with word finding difficulty.  He states this occurs when she has elevated blood pressure due to left-sided carotid artery stenosis.  Patient currently is not experiencing any weakness or numbness.  Patient states that her chest pain improved after receiving nitroglycerin and aspirin with EMS.  She currently is not experiencing any chest pain.  Her story is concerning for ACS. Her physical exam is notable for no focal neuro deficits on exam. No changes in weakness or motor. Patient is at her mental baseline per the patient and the son. Her EKG shows no signs of ischemia. Due to her son saying she had an episode of aphasia a CT head was performed that showed no acute intracranial abnormality. Her initial troponin was unremarkable, however, second troponin was 39. Her BMP showed a creatinine of 1.32 up from 1.1.   Cardiology was consulted due to concerning story for ACS. She had a  hear score of 6.  Patient is admitted to hospital service for further evaluation.  Patient was given nitroglycerin for chest pain relief.  After patient was admitted to hospital service, nurse noted that patient was having aphasia and left-sided weakness. A code stroke was called.  Neurology evaluated the patient.  Patient has returned back to her baseline relatively quickly.  Of note she was hypertensive when this occurred.  She is currently stable and at  her mental baseline.   The plan for this patient was discussed with Dr. Bernette Mayers, who voiced agreement and who oversaw evaluation and treatment of this patient.   Final Clinical Impression(s) / ED Diagnoses Final diagnoses:  Chest pain, unspecified type    Rx / DC Orders ED Discharge Orders     None        Lottie Dawson, MD 11/16/20 1633    Pollyann Savoy, MD 11/17/20 [phone]

## 2020-11-16 NOTE — ED Notes (Signed)
Pt transported to MRI at this time 

## 2020-11-16 NOTE — H&P (Signed)
Date: 11/16/2020               Patient Name:  Kristen Dudley MRN: [id number]  DOB: 06-11-1927 Age / Sex: 87 y.o., female   PCP: Pcp, No         Medical Service: Internal Medicine Teaching Service         Attending Physician: Dr. Inez Catalina, MD    First Contact: Elza Rafter, DO Pager: RA [phone]  Second Contact: Merrilyn Puma, MD Pager: Nada Maclachlan [phone]       After Hours (After 5p/  First Contact Pager: [phone]  weekends / holidays): Second Contact Pager: [phone]   SUBJECTIVE  Chief Complaint: Chest pain  History of Present Illness: Kristen Dudley is a 87 y.o. female with a pertinent PMH of peripheral artery disease, hypertension, hyperlipidemia, chronic renal insufficiency, and symptomatic left carotid artery stenosis with recent TIA who presents to [facility] with chest pain.  Kristen Dudley endorses that she was in her usual state of health until around 4-5AM when she had sudden onset substernal chest pain radiating to her right arm and right scapula. She endorsed that this lasted until she received nitroglycerin and aspirin. Her son noted her to appear pale and diaphoretic during the episode that has since resolved. She reports that she has a history of TIA and was having word finding difficulty, especially when she gets excited or stressed. Per son, she kept saying "my chest" repeatedly during the episode of chest pain but was unable to complete the sentence. He reports that this is not new and during the episodes, she often appears confused with "staring into space". She does note some mild nausea but no emesis. She denies any recent illnesses including fevers/chills, cough, shortness of breath, abdominal pain, constipation or diarrhea.   Patient was noted to be pale, diaphoretic and nauseous on EMS arrival with SBP up to 190, HR 80, saturating well o nroom air. She was given aspirin and nitroglycerin with improvement in symptoms. EKG without any new signs of ischemia. CT Head without acute  intracranial abnormalities. Patient had mild elevation in troponin to 39 for which cardiology consulted and patient admitted to internal medicine.   Medications: No current facility-administered medications on file prior to encounter.   Current Outpatient Medications on File Prior to Encounter  Medication Sig Dispense Refill   acetaminophen (TYLENOL) 500 MG tablet Take 1,000 mg by mouth every 6 (six) hours as needed for mild pain.     albuterol (VENTOLIN HFA) 108 (90 Base) MCG/ACT inhaler Inhale 2 puffs into the lungs every 6 (six) hours as needed for wheezing or shortness of breath.     aspirin EC 81 MG tablet Take 81 mg by mouth daily. Swallow whole.     atorvastatin (LIPITOR) 20 MG tablet Take 20 mg by mouth at bedtime.     clopidogrel (PLAVIX) 75 MG tablet TAKE 1 TABLET BY MOUTH EVERY DAY (Patient taking differently: Take 75 mg by mouth daily.) 30 tablet 0   magnesium oxide (MAG-OX) 400 (240 Mg) MG tablet Take 400 mg by mouth daily.     Omega-3 Fatty Acids (FISH OIL) 1000 MG CAPS Take 1,000 mg by mouth daily.     pantoprazole (PROTONIX) 40 MG tablet TAKE 1 TABLET BY MOUTH EVERY DAY (Patient taking differently: Take 40 mg by mouth daily.) 90 tablet 3    Past Medical History:  Past Medical History:  Diagnosis Date   Hearing loss    PVD (peripheral vascular  disease) [facility])     Social:  Patient lives at home with her son who is her primary caretaker.  She does require some assistance with her ADLs.  She has a remote history of tobacco use with smoking 1 pack/day for over 40 years; however, she quit after she had a stent placed for her peripheral vascular disease.  She denies any history of alcohol or illicit substance use.  Family History: Family History  Problem Relation Age of Onset   Breast cancer Mother    Heart disease Father     Allergies: Allergies as of 11/16/2020 - Review Complete 11/16/2020  Allergen Reaction Noted   Codeine Other (See Comments) 08/05/2020   Iohexol   02/25/2018    Review of Systems: A complete ROS was negative except as per HPI.   OBJECTIVE:  Physical Exam: Blood pressure (!) 193/99, pulse 87, temperature 97.8 F (36.6 C), temperature source Oral, resp. rate 13, height 5\' 2"  (1.575 m), weight 78.2 kg, SpO2 97 %. Physical Exam  Constitutional: elderly female, hard of hearing, well-developed and well-nourished. No distress.  HENT: Normocephalic and atraumatic, EOMI, conjunctiva normal, moist mucous membranes Cardiovascular: Normal rate, regular rhythm, S1 and S2 present, no murmurs, rubs, gallops.  Distal pulses intact Respiratory: No respiratory distress, no accessory muscle use.Lungs are clear to auscultation bilaterally. GI: Nondistended, soft, nontender to palpation, normal bowel sounds Musculoskeletal: Normal bulk and tone.  No peripheral edema noted. Neurological: Is alert and oriented x4, some word finding difficulty and intermittent confusion, strength 5/5 in all extremities and sensation grossly intact  Skin: Warm and dry.  No rash, erythema, lesions noted. Psychiatric: Normal mood and affect.    Pertinent Labs: CBC    Component Value Date/Time   WBC 10.3 11/16/2020 0616   RBC 5.30 (H) 11/16/2020 0616   HGB 15.7 (H) 11/16/2020 0616   HCT 48.3 (H) 11/16/2020 0616   PLT 512 (H) 11/16/2020 0616   MCV 91.1 11/16/2020 0616   MCH 29.6 11/16/2020 0616   MCHC 32.5 11/16/2020 0616   RDW 14.9 11/16/2020 0616   LYMPHSABS 3,390 03/17/2018 1440   MONOABS 2.0 (H) 02/25/2018 1552   EOSABS 80 03/17/2018 1440   BASOSABS 60 03/17/2018 1440     CMP     Component Value Date/Time   NA 138 11/16/2020 0616   K 3.9 11/16/2020 0616   CL 104 11/16/2020 0616   CO2 25 11/16/2020 0616   GLUCOSE 133 (H) 11/16/2020 0616   BUN 21 11/16/2020 0616   CREATININE 1.32 (H) 11/16/2020 0616   CREATININE 1.10 (H) 03/17/2018 1440   CALCIUM 9.6 11/16/2020 0616   PROT 7.4 03/17/2018 1440   ALBUMIN 3.5 02/27/2018 1758   AST 17 03/17/2018 1440    ALT 12 03/17/2018 1440   ALKPHOS 57 02/27/2018 1758   BILITOT 1.0 03/17/2018 1440   GFRNONAA 38 (L) 11/16/2020 0616   GFRNONAA 45 (L) 03/17/2018 1440   GFRAA 52 (L) 03/17/2018 1440    Pertinent Imaging: DG Chest 2 View  Result Date: 11/16/2020 CLINICAL DATA:  Chest pain EXAM: CHEST - 2 VIEW COMPARISON:  03/17/2018 FINDINGS: Normal heart size and mediastinal contours. No acute infiltrate or edema. Calcified nodules at the right base. No effusion or pneumothorax. No acute osseous findings. IMPRESSION: No active cardiopulmonary disease. Electronically Signed   By: 03/19/2018 M.D.   On: 11/16/2020 06:54   CT HEAD WO CONTRAST (11/18/2020)  Result Date: 11/16/2020 CLINICAL DATA:  Speech difficulty. EXAM: CT HEAD WITHOUT CONTRAST TECHNIQUE: Contiguous  axial images were obtained from the base of the skull through the vertex without intravenous contrast. COMPARISON:  February 25, 2018. FINDINGS: Brain: Mild chronic ischemic white matter disease is noted. No mass effect or midline shift is noted. Ventricular size is within normal limits. There is no evidence of mass lesion, hemorrhage or acute infarction. Vascular: No hyperdense vessel or unexpected calcification. Skull: Normal. Negative for fracture or focal lesion. Sinuses/Orbits: No acute finding. Other: None. IMPRESSION: No acute intracranial abnormality seen. Electronically Signed   By: Lupita Raider M.D.   On: 11/16/2020 09:20    EKG: personally reviewed my interpretation is normal EKG, normal sinus rhythm  ASSESSMENT & PLAN:  Assessment: Active Problems:   Chest pain   Kristen Dudley is a 87 y.o. with pertinent PMH of peripheral vascular disease, mild AAA, history of TIA,hypertension, hyperlipidemia, and chronic renal insufficiency who presented with chest pain and admit for further evaluation of her chest pain on hospital day 0  Plan: #Chest pain, atypical #Hx of coronary artery disease Patient presented with acute onset substernal chest  pain radiating to the right arm and right scapula that was relieved with nitroglycerin and aspirin. She did not have significant EKG changes but did have mild elevation in troponins 13>39>66. Cardiology consulted. Suspect her elevated troponins are in setting of severe hypertension. Recommend for goal directed medical therapy. - Cardiology consulted, appreciate recommendations - F/u Echo - If chest pain is persistent, consider imdur 30mg  daily   #TIA #Carotid artery stenosis Patient has a history of TIA's in setting of carotid artery stenosis with intermittent episodes of aphasia and confusion. Due to initial concerns for aphasia at home, CT Head wo Contrast obtained which was negative for acute intracranial abnormality. Patient's mental status was at baseline during my evaluation. However, noted to have acute confusion with aphasia for which Code Stroke activated. However, seems like these symptoms were transient and likely in setting of symptomatic carotid artery stenosis.  - Neurology consulted, appreciate recommendations - F/u MRI brain - Continue aspirin 81mg  daily, plavix 75mg  daily - Continue atorvastatin 20mg  daily   #Hypertension  Patient does not have any antihypertensives at home. She was noted to be hypertensive on exam. Suspect this may have contributed to her chest pain. - Amlodipine 5mg  daily   #GERD Continue protonix 40mg  daily   Best Practice: Diet: Cardiac diet IVF: Fluids: None, Rate: None VTE: enoxaparin (LOVENOX) injection 30 mg Start: 11/16/20 1130 Code: DNR Status: Observation with expected length of stay less than 2 midnights. Anticipated Discharge Location: Home Barriers to Discharge: Medical stability  Signature: , MD Internal Medicine Resident, PGY-3 Internal Medicine Residency  Pager: [phone] 12:37 PM, 11/16/2020   Please contact the on call pager after 5 pm and on weekends at [phone].

## 2020-11-16 NOTE — ED Provider Notes (Signed)
Emergency Medicine Provider Triage Evaluation Note  RILYA LONGO , a 87 y.o. female  was evaluated in triage.  Pt complains of chest pain that radiates to her neck and right arm.  Onset was ~0445.  Received aspirin and nitro with some improvement.  States that pain is much better now.  Has hx of left carotid artery stenosis on medical management.  Takes aspirin and plavix.  Review of Systems  Positive: Chest pain, arm pain Negative: Fever, chills  Physical Exam  There were no vitals taken for this visit. Gen:   Awake, no distress   Resp:  Normal effort  MSK:   Moves extremities without difficulty  Other:  Intact distal pulses  Medical Decision Making  Medically screening exam initiated at 6:05 AM.  Appropriate orders placed.  CHAYIL GANTT was informed that the remainder of the evaluation will be completed by another provider, this initial triage assessment does not replace that evaluation, and the importance of remaining in the ED until their evaluation is complete.  Chest pain   Roxy Horseman, PA-C 11/16/20 1093    Glynn Octave, MD 11/16/20 [phone]

## 2020-11-16 NOTE — ED Notes (Signed)
IM residents at bedside

## 2020-11-16 NOTE — ED Triage Notes (Signed)
Per EMS, pt was woken from sleep w/ left sided dull chest pain that radiated to neck and left arm.  Pt was pale, diaphoretic and nausea on scene.    Given 324 ASA 1 .4 nitro  190/130   150/100 80HR SR 18RR 97% RA CBG 129

## 2020-11-16 NOTE — ED Notes (Signed)
Pt back from MRI 

## 2020-11-16 NOTE — ED Notes (Signed)
Pt's son reports pt has trouble speaking d/t blocked carotid and reports around 0300 pt was moaning in bed and had trouble speaking. Pt's son called EMS.

## 2020-11-16 NOTE — ED Notes (Signed)
Paged internal medicine teaching service residents to ask for a diet order for pt d/t pt passing swallow screen. Awaiting reply.

## 2020-11-16 NOTE — ED Provider Notes (Signed)
Patient seen and examined, agree with assessment and plan by Resident. Here for chest pain, also had a brief episode of aphasia. Similar in the past with elevated BP, known carotid stenosis from recent admission at [facility]. Trop is increasing. Had been pain free but began having some L shoulder pain, given NTG. Later in ED stay had sudden onset of aphasia and L sided weakness. Code Stroke initiated but symptoms resolved relatively quickly.   .Critical Care  Date/Time: 11/16/2020 12:56 PM Performed by: Pollyann Savoy, MD Authorized by: Pollyann Savoy, MD   Critical care provider statement:    Critical care time (minutes):  45   Critical care time was exclusive of:  Separately billable procedures and treating other patients   Critical care was necessary to treat or prevent imminent or life-threatening deterioration of the following conditions:  CNS failure or compromise and cardiac failure   Critical care was time spent personally by me on the following activities:  Discussions with consultants, evaluation of patient's response to treatment, examination of patient, ordering and performing treatments and interventions, ordering and review of laboratory studies, ordering and review of radiographic studies, pulse oximetry, re-evaluation of patient's condition, obtaining history from patient or surrogate and review of old charts   Care discussed with: admitting provider      Pollyann Savoy, MD 11/16/20 1257

## 2020-11-16 NOTE — ED Notes (Signed)
Cardiology at bedside.

## 2020-11-16 NOTE — Consult Note (Signed)
Neurology Consultation  Reason for Consult: Acute onset of word-finding difficulties Referring Physician: Dr. Bernette Mayers  CC: Intermittent speech disturbance  History is obtained from: Patient, Patient's son at bedside, EDP, ED RN, Chart review  HPI: Kristen Dudley is a 87 y.o. female with a medical history significant for peripheral vascular disease, essential hypertension not on antihypertensive medications, hyperlipidemia, tobacco use, right basal ganglia infarction, and symptomatic severe 70-99% stenosis of the left carotid artery identified in May of 2022 following symptoms of a transient 20 minute speech arrest. She was discharged on dual-antiplatelet therapy with a follow-up appointment missed today with Dr. Manson Passey for further management of left ICA stenosis due to presentation to the ED with complaints of chest pain. Patient's son at bedside states that Kristen Dudley has intermittent episodes of speech disturbance every 2-3 weeks and occur when she "gets excited" and noted that at home this morning she had word-finding difficulties for 30-45 minutes before resolving. On arrival to the ED, she no longer had speech abnormalities and was worked up for her chest pain. At 11:56 her RN was evaluating her and noted that she had decreased verbalization and trouble with word-finding and a Code Stroke was activated for neurology evaluation.   LKW: 11:56 tpa given?: no, symptoms resolved during assessment IR Thrombectomy? No, symptoms resolved during assessment, CT angio imaging reviewed without evidence of LVO Modified Rankin Scale: 0-Completely asymptomatic and back to baseline post- stroke  ROS: A complete ROS was performed and is negative except as noted in the HPI.  Past Medical History:  Diagnosis Date   Hearing loss    PVD (peripheral vascular disease) (HCC)   Essential hypertension Hyperlipidemia Remote right basal ganglia CVA Severe symptomatic left ICA stenosis  Past Surgical History:   Procedure Laterality Date   ABDOMINAL HYSTERECTOMY     CATARACT EXTRACTION, BILATERAL  2007   CHOLECYSTECTOMY OPEN  1961   IR ERCP EXCH REM STENTS BIL PAN DUCT INC DIL EA STENT EXCH Right 2003   leg stents   ORIF FEMUR FRACTURE Right 2014   Family History  Problem Relation Age of Onset   Breast cancer Mother    Heart disease Father    Social History:   reports that she quit smoking about 19 years ago. She has a 25.00 pack-year smoking history. She has never used smokeless tobacco. She reports that she does not drink alcohol and does not use drugs.  Medications  Current Facility-Administered Medications:    acetaminophen (TYLENOL) tablet 650 mg, 650 mg, Oral, Q6H PRN **OR** acetaminophen (TYLENOL) suppository 650 mg, 650 mg, Rectal, Q6H PRN, Dudley, Sadia, MD   aspirin EC tablet 81 mg, 81 mg, Oral, Daily, Dudley, Sadia, MD   atorvastatin (LIPITOR) tablet 20 mg, 20 mg, Oral, QHS, Dudley, Sadia, MD   clopidogrel (PLAVIX) tablet 75 mg, 75 mg, Oral, Daily, Dudley, Sadia, MD   enoxaparin (LOVENOX) injection 30 mg, 30 mg, Subcutaneous, Q24H, Dudley, Sadia, MD   nitroGLYCERIN (NITROSTAT) SL tablet 0.4 mg, 0.4 mg, Sublingual, Once, Lottie Dawson, MD   pantoprazole (PROTONIX) EC tablet 40 mg, 40 mg, Oral, Daily, Dudley, Sadia, MD   sodium chloride flush (NS) 0.9 % injection 3 mL, 3 mL, Intravenous, Q12H, Dudley, Sadia, MD  Current Outpatient Medications:    acetaminophen (TYLENOL) 500 MG tablet, Take 1,000 mg by mouth every 6 (six) hours as needed for mild pain., Disp: , Rfl:    albuterol (VENTOLIN HFA) 108 (90 Base) MCG/ACT inhaler, Inhale 2 puffs into the lungs every 6 (  six) hours as needed for wheezing or shortness of breath., Disp: , Rfl:    aspirin EC 81 MG tablet, Take 81 mg by mouth daily. Swallow whole., Disp: , Rfl:    atorvastatin (LIPITOR) 20 MG tablet, Take 20 mg by mouth at bedtime., Disp: , Rfl:    clopidogrel (PLAVIX) 75 MG tablet, TAKE 1 TABLET BY MOUTH EVERY DAY (Patient taking  differently: Take 75 mg by mouth daily.), Disp: 30 tablet, Rfl: 0   magnesium oxide (MAG-OX) 400 (240 Mg) MG tablet, Take 400 mg by mouth daily., Disp: , Rfl:    Omega-3 Fatty Acids (FISH OIL) 1000 MG CAPS, Take 1,000 mg by mouth daily., Disp: , Rfl:    pantoprazole (PROTONIX) 40 MG tablet, TAKE 1 TABLET BY MOUTH EVERY DAY (Patient taking differently: Take 40 mg by mouth daily.), Disp: 90 tablet, Rfl: 3  Exam: Current vital signs: BP (!) 179/83   Pulse 88   Temp 97.8 F (36.6 C) (Oral)   Resp (!) 21   Ht 5\' 2"  (1.575 m)   Wt 78.2 kg   SpO2 97%   BMI 31.53 kg/m  Vital signs in last 24 hours: Temp:  [97.8 F (36.6 C)] 97.8 F (36.6 C) (08/18 0615) Pulse Rate:  [76-91] 88 (08/18 1245) Resp:  [13-28] 21 (08/18 1245) BP: (146-208)/(71-99) 179/83 (08/18 1245) SpO2:  [93 %-99 %] 97 % (08/18 1245) Weight:  [78.2 kg] 78.2 kg (08/18 0818)  GENERAL: Awake, alert, sitting in ER stretcher, in no acute distress Psych: Affect appropriate for situation, patient is calm and cooperative with examination Head: Normocephalic and atraumatic, without obvious abnormality EENT: Normal conjunctivae, dry mucous membranes, no OP obstruction LUNGS: Normal respiratory effort. Non-labored breathing on room air CV: Regular rate and rhythm on telemetry, hypertensive on cardiac monitor ABDOMEN: Soft, non-tender, non-distended Extremities: warm, well perfused, without obvious deformity  NEURO:  Mental Status: Awake, alert, and oriented to person, age, and month. She is able to provide some details regarding her history of present illness but her son at bedside provides most details. Initially, patient does has decreased verbalization with word-finding difficulties and answers questions with nodding or gestures e.x. shows fingers for number of fingers she sees in each visual field.  Once she was on the CT table, she was speaking with examiners and was oriented without dysarthria or aphasia.  No neglect is  noted Cranial Nerves:  II: PERRL. Visual fields full.  III, IV, VI: EOMI without ptosis, nystagmus, or gaze preference.  V: Decreased sensation to the left face noted with light touch.  VII: Face is symmetric resting and smiling.   VIII: Hearing is intact to voice IX, X: Palate elevation is symmetric. Phonation normal.  XI: Normal sternocleidomastoid and trapezius muscle strength XII: Tongue protrudes midline without fasciculations.   Motor: 5/5 strength is all muscle groups without vertical drift throughout.  Tone is normal. Bulk is normal.  Sensation: Reports decreased sensation to light touch on the left upper and lower extremities though when assessing lower extremities she went back and forth with some reporting of equal sensation, some reporting of decreased sensation on the right, and at other times decreased sensation on the LLE before settling on decreased sensation to light touch on the LLE.  Coordination: FTN intact bilaterally. HKS intact bilaterally. Bradykinetic movements throughout but without ataxia.  DTRs: 2+ and symmetric biceps, brachioradialis, and 1+ patellae Gait: Deferred  NIHSS: 1a Level of Conscious.: 0 1b LOC Questions: 0 1c LOC Commands: 0 2 Best  Gaze: 0 3 Visual: 0 4 Facial Palsy: 0 5a Motor Arm - left: 0 5b Motor Arm - Right: 0 6a Motor Leg - Left: 0 6b Motor Leg - Right: 0 7 Limb Ataxia: 0 8 Sensory: 1 9 Best Language: 0 10 Dysarthria: 1 initially at 12:15 with resolution at 12:20  11 Extinct. and Inatten.: 0 TOTAL: 2 initially with some word-finding difficulties  --> rapid improvement to a 1 on neurology evaluation with resolution of speech disturbance  Labs I have reviewed labs in epic and the results pertinent to this consultation are: CBC    Component Value Date/Time   WBC 10.3 11/16/2020 0616   RBC 5.30 (H) 11/16/2020 0616   HGB 15.7 (H) 11/16/2020 0616   HCT 48.3 (H) 11/16/2020 0616   PLT 512 (H) 11/16/2020 0616   MCV 91.1 11/16/2020  0616   MCH 29.6 11/16/2020 0616   MCHC 32.5 11/16/2020 0616   RDW 14.9 11/16/2020 0616   LYMPHSABS 3,390 03/17/2018 1440   MONOABS 2.0 (H) 02/25/2018 1552   EOSABS 80 03/17/2018 1440   BASOSABS 60 03/17/2018 1440   CMP     Component Value Date/Time   NA 138 11/16/2020 0616   K 3.9 11/16/2020 0616   CL 104 11/16/2020 0616   CO2 25 11/16/2020 0616   GLUCOSE 133 (H) 11/16/2020 0616   BUN 21 11/16/2020 0616   CREATININE 1.32 (H) 11/16/2020 0616   CREATININE 1.10 (H) 03/17/2018 1440   CALCIUM 9.6 11/16/2020 0616   PROT 7.4 03/17/2018 1440   ALBUMIN 3.5 02/27/2018 1758   AST 17 03/17/2018 1440   ALT 12 03/17/2018 1440   ALKPHOS 57 02/27/2018 1758   BILITOT 1.0 03/17/2018 1440   GFRNONAA 38 (L) 11/16/2020 0616   GFRNONAA 45 (L) 03/17/2018 1440   GFRAA 52 (L) 03/17/2018 1440   Lipid Panel     Component Value Date/Time   CHOL 198 03/17/2018 1440   TRIG 249 (H) 03/17/2018 1440   HDL 48 (L) 03/17/2018 1440   CHOLHDL 4.1 03/17/2018 1440   LDLCALC 114 (H) 03/17/2018 1440   Imaging I have reviewed the images obtained:  CT-scan of the brain 11/16/2020: 1. No acute intracranial process. 2. ASPECTS is 10  CT angio head and neck wwo contrast 11/16/2020: - No large vessel occlusion. - Plaque at the proximal right ICA causes 60% stenosis. Plaque at the proximal left ICA causes 50% stenosis. - Moderate stenosis distal left P1 PCA. - Possible 2 mm broad-based outpouching just proximal to the left PICA origin. May reflect atherosclerotic irregularity or a small aneurysm.  MRI examination of the brain pending  Assessment: 87 y.o. female with known severe symptomatic left ICA stenosis on DAPT who presented to the ED this morning for evaluation of chest pain and with transient 30 - 45 minute episode of speech disturbance with speech arrest PTA with witnessed episode while in the ED with subsequent Code Stroke activation. Episode resolved quickly on neurology evaluation, therefore, no tPA  administered. Patient was supposed to follow up with [facility]'s vascular team outpatient today for further evaluation and management of known ICA stenosis. Patient's son reports episodes of speech disturbance every 2-3 weeks and in association with patient "excitement" despite current DAPT therapy. On evaluation, she was hypertensive with a blood pressure of 208/96 with low suspicion of cerebral hypoperfusion related to blood pressure. Presentation is most consistent with known symptomatic left ICA stenosis identified in May at OSH. Patient had outpatient follow up scheduled today but was unable  to attend follow up appointment due to ER evaluation of chest pain.   Impression: Severe symptomatic left ICA stenosis- 70-99%  Transient speech disturbance with speech arrest related to known ICA stenosis  Recommendations: - MRI brain without contrast, if acute stroke identified, will expand full stroke work up - Maintain systolic blood pressure up to 409180 if agreeable with cardiology  - Appreciate vascular team evaluation and recommendations with severe symptomatic left ICA stenosis on DAPT  Pt seen by NP/Neuro and later by MD. Note/plan to be edited by MD as needed.  Lanae BoastStevi Toberman, AGAC-NP [facility] Pager: [phone]  I have seen the patient and reviewed the above note.  She has episodes of aphasia once every 2 to 3 weeks in the setting of left carotid stenosis, which was read as 70 to 99% previously.  The CTA does not seem to suggest that this quite as high, and so I think it would be worth repeating carotid ultrasound.  If the MRI is negative, and the carotid ultrasound agrees that the stenosis is not as severe, then I would favor this likely being related more to high blood pressure (e.g. hypertensive encephalopathy) rather than aphasia.  This would correlate with the sons suggestion that it always occurs with high blood pressure.  1) carotid ultrasound 2) chest pain per internal  medicine. 3) neurology will follow  Ritta SlotMcNeill Lamine Laton, MD [facility] [phone]  If 7pm- 7am, please page neurology on call as listed in AMION.

## 2020-11-16 NOTE — Code Documentation (Signed)
Stroke Response Nurse Documentation Code Stroke Documentation DONZELLA CARROL  is a 87 y.o.  y.o. female who came to [facility] on 11/16/2020  with a complaint of chest pain, PMH of PVD, CKD, HLD, TIA, known stenosis of L ICA. Code stroke was activated by ED, pt was LKW at 1156 and shortly after noon ED RN was notified by family member that patient was acting weird. Patient taking aspirin 81 mg daily and clopidogrel 75 mg daily PTA. Stroke team at the bedside on patient arrival, CBG 95, labs drawn and patient cleared for CT by Dr. Bernette Mayers. Patient taken to CT with team. NIHSS 1, see documentation for details and code stroke times. Patient with left decreased sensation on exam. The following imaging was completed:  CT, CTA head and neck. Patient is not a candidate for tPA due to symptoms too mild to treat at this time per Dr. Amada Jupiter, patient will be within tPA window until 1626. Care/Plan: q68m VS/q16m mNIHSS while patient is in tPA window, followed by q2h VS/neuro checks. Bedside handoff with ED RN Irving Burton.    Esmond Camper  Stroke Response RN [phone]

## 2020-11-17 ENCOUNTER — Observation Stay (HOSPITAL_BASED_OUTPATIENT_CLINIC_OR_DEPARTMENT_OTHER): Payer: Medicare Other

## 2020-11-17 ENCOUNTER — Other Ambulatory Visit (HOSPITAL_COMMUNITY): Payer: Self-pay

## 2020-11-17 ENCOUNTER — Observation Stay (HOSPITAL_COMMUNITY): Payer: Medicare Other

## 2020-11-17 DIAGNOSIS — R079 Chest pain, unspecified: Secondary | ICD-10-CM

## 2020-11-17 DIAGNOSIS — I6529 Occlusion and stenosis of unspecified carotid artery: Secondary | ICD-10-CM

## 2020-11-17 DIAGNOSIS — G458 Other transient cerebral ischemic attacks and related syndromes: Secondary | ICD-10-CM

## 2020-11-17 LAB — BASIC METABOLIC PANEL
Anion gap: 10 (ref 5–15)
BUN: 20 mg/dL (ref 8–23)
CO2: 27 mmol/L (ref 22–32)
Calcium: 9.7 mg/dL (ref 8.9–10.3)
Chloride: 99 mmol/L (ref 98–111)
Creatinine, Ser: 1.37 mg/dL — ABNORMAL HIGH (ref 0.44–1.00)
GFR, Estimated: 36 mL/min — ABNORMAL LOW (ref >60)
Glucose, Bld: 108 mg/dL — ABNORMAL HIGH (ref 70–99)
Potassium: 4.2 mmol/L (ref 3.5–5.1)
Sodium: 136 mmol/L (ref 135–145)

## 2020-11-17 LAB — CBC
HCT: 45.9 % (ref 36.0–46.0)
Hemoglobin: 15.1 g/dL — ABNORMAL HIGH (ref 12.0–15.0)
MCH: 29.7 pg (ref 26.0–34.0)
MCHC: 32.9 g/dL (ref 30.0–36.0)
MCV: 90.4 fL (ref 80.0–100.0)
Platelets: 542 10*3/uL — ABNORMAL HIGH (ref 150–400)
RBC: 5.08 MIL/uL (ref 3.87–5.11)
RDW: 14.9 % (ref 11.5–15.5)
WBC: 13 10*3/uL — ABNORMAL HIGH (ref 4.0–10.5)
nRBC: 0 % (ref 0.0–0.2)

## 2020-11-17 LAB — ECHOCARDIOGRAM COMPLETE
Area-P 1/2: 1.94 cm2
Height: 62 in
S' Lateral: 3 cm
Weight: 2758.4 oz

## 2020-11-17 MED ORDER — ONDANSETRON HCL 4 MG PO TABS
4.0000 mg | ORAL_TABLET | Freq: Every day | ORAL | 0 refills | Status: AC | PRN
  Filled 2020-11-17: qty 20, 20d supply, fill #0

## 2020-11-17 MED ORDER — NITROGLYCERIN 0.4 MG SL SUBL: 0.4000 mg | SUBLINGUAL_TABLET | SUBLINGUAL | 0 refills | Status: AC | PRN

## 2020-11-17 MED ORDER — AMLODIPINE BESYLATE 5 MG PO TABS: 5.0000 mg | ORAL_TABLET | Freq: Every day | ORAL | 0 refills | Status: AC

## 2020-11-17 NOTE — Progress Notes (Signed)
EEG complete - results pending 

## 2020-11-17 NOTE — Progress Notes (Addendum)
Neurology Progress Note  S: Patient states that she feels great. She has had no other spells of aphasia or confusion. States she got up yesterday and walked to bathroom with assistance and has no dizziness or syncope type feelings. Said she is weak, but all over. Having some chest pain relieved by Tylenol and cardiology is on the case.   NP explained results of testing thus far and that she will have a carotid US today to compare apples to apples from the one from May. She denies nausea/vomiting, HA, vision changes, weakness of a particular extremity, or speech changes.    O: Current vital signs: BP 115/62 (BP Location: Right Arm)   Pulse 93   Temp 99.1 F (37.3 C) (Oral)   Resp 18   Ht 5\' 2"  (1.575 m)   Wt 78.2 kg   SpO2 94%   BMI 31.53 kg/m  Vital signs in last 24 hours: Temp:  [97.8 F (36.6 C)-99.1 F (37.3 C)] 99.1 F (37.3 C) (08/19 0740) Pulse Rate:  [64-96] 93 (08/19 0740) Resp:  [13-28] 18 (08/19 0740) BP: (115-208)/(62-99) 115/62 (08/19 0740) SpO2:  [94 %-100 %] 94 % (08/19 0740)  GENERAL: Very well appearing elderly female resting in bed. Awake, alert in NAD. HEENT: Normocephalic and atraumatic. LUNGS: Normal respiratory effort.  CV: RRR. Ext: warm. Skin: Well healed scars to RLE.    NEURO:  Mental Status: Alert and oriented x 4.  Speech/Language: speech is without aphasia or dysarthria.  Naming, repetition, fluency, and comprehension intact.  Cranial Nerves:  II: PERRL. Visual fields full.  III, IV, VI: EOMI. Eyelids elevate symmetrically.  V: Sensation is intact to light touch and symmetrical to face.  VII: Smile is symmetrical. Able to puff cheeks and raise eyebrows.  VIII: hearing intact to voice. IX, X: Palate elevates symmetrically. Phonation is normal.  03-03-1991 shrug 5/5. XII: tongue is midline without fasciculations. Motor:  5/5 strength throughout. Tone: is normal and bulk is normal. Sensation- Intact to light touch bilaterally. Extinction  absent to light touch to DSS.    Coordination: FTN intact bilaterally. No drift.  DTRs:  2+ BUEs.  0 Patella.  Medications  Current Facility-Administered Medications:    acetaminophen (TYLENOL) tablet 650 mg, 650 mg, Oral, Q6H PRN **OR** acetaminophen (TYLENOL) suppository 650 mg, 650 mg, Rectal, Q6H PRN, Aslam, Sadia, MD   amLODipine (NORVASC) tablet 5 mg, 5 mg, Oral, Daily, Aslam, Sadia, MD   aspirin EC tablet 81 mg, 81 mg, Oral, Daily, Aslam, Sadia, MD, 81 mg at 11/16/20 1615   atorvastatin (LIPITOR) tablet 20 mg, 20 mg, Oral, QHS, Aslam, Sadia, MD, 20 mg at 11/16/20 2253   clopidogrel (PLAVIX) tablet 75 mg, 75 mg, Oral, Daily, Aslam, Sadia, MD, 75 mg at 11/16/20 1615   enoxaparin (LOVENOX) injection 30 mg, 30 mg, Subcutaneous, Q24H, Aslam, Sadia, MD, 30 mg at 11/16/20 1615   nitroGLYCERIN (NITROSTAT) SL tablet 0.4 mg, 0.4 mg, Sublingual, Once, 11/18/20, MD   ondansetron [facility]) tablet 4 mg, 4 mg, Oral, Q8H PRN, [facility], MD, 4 mg at 11/16/20 2314   pantoprazole (PROTONIX) EC tablet 40 mg, 40 mg, Oral, Daily, Aslam, Sadia, MD, 40 mg at 11/16/20 1615   sodium chloride flush (NS) 0.9 % injection 3 mL, 3 mL, Intravenous, Q12H, Aslam, Sadia, MD, 3 mL at 11/16/20 2253  Imaging CT Head No acute intracranial abnormality seen.  MRI brain No acute intracranial pathology. Global parenchymal volume loss and advanced chronic white matter microangiopathy.  CTA head  and neck No large vessel occlusion. Plaque at the proximal right ICA causes 60% stenosis. Plaque at the proximal left ICA causes 50% stenosis. Moderate stenosis distal left P1 PCA.  Possible 2 mm broad-based outpouching just proximal to the left PICA origin. May reflect atherosclerotic irregularity or a small aneurysm.   Carotid US-RICA 7-90%        LICA 60-79%  EEG: negative for seizures or epileptical discharges.   Assessment: 87 yo female with known Left ICA stenosis on DAPT who presented with chest pain and  transient 30-45 minutes of speech disturbance with speech arrest. Son reported speech disturbance ever 2-3 weeks in association with patient excitement and hypertension. Medicine team has started anti hypertensive and her BPs have improved from the 170-180 range to 115-150. Given her aphasia with these spells, will check a rEEG to rule out seizure, although this is low on the differential. Although her CTA showed less LICA stenosis than her prior US, we will recheck [place] today to see if it matches the CTA neck results. MRI brain is essentially negative and if carotid US shows lessened stenosis compared to May, this is likely contributed to HTN encephalopathy. Addendum: Vascular US results are similar to CTA neck here and are not consistent with the findings of the US performed in May 2022. Therefore, this is likely secondary to hypertensive urgency. EEG negative for seizures to explain her episodes.  Impression: -transient episodes of aphasia with speech arrest.  -LICA stenosis (70-99% on [place] in May) 60-79% here on [place]. 60% on CTA neck here.  -Hypertension.  -PAD.    Recommendations/Plan:  -Continue DAPT and statin as secondary stroke prevention.  -BP goal, normotensive.  -Neurology will be available for questions prn.   Pt seen by Jimmye Norman, MSN, APN-BC/Nurse Practitioner/Neuro Pager: [phone]

## 2020-11-17 NOTE — Discharge Summary (Addendum)
Name: Kristen Dudley MRN: [id number] DOB: 23-Sep-1927 87 y.o. PCP: Pcp, No  Date of Admission: 11/16/2020  6:06 AM Date of Discharge:  11/17/2020 Attending Physician: Dr. Criselda Peaches  DISCHARGE DIAGNOSIS:  Primary Problem: Carotid artery stenosis, symptomatic   Hospital Problems: Principal Problem:   Carotid artery stenosis, symptomatic Active Problems:   Stage 3 chronic kidney disease (HCC)   Hyperlipidemia   Acute cerebrovascular insufficiency transient focal neurologic deficit    DISCHARGE MEDICATIONS:   Allergies as of 11/17/2020       Reactions   Codeine Other (See Comments)   agitation   Iohexol    "makes me pass out" *Pt got IV contrast during a code stroke on 11/14/20 without any pre-meds and did just fine.        Medication List     TAKE these medications    acetaminophen 500 MG tablet Commonly known as: TYLENOL Take 1,000 mg by mouth every 6 (six) hours as needed for mild pain.   albuterol 108 (90 Base) MCG/ACT inhaler Commonly known as: VENTOLIN HFA Inhale 2 puffs into the lungs every 6 (six) hours as needed for wheezing or shortness of breath.   amLODipine 5 MG tablet Commonly known as: NORVASC Take 1 tablet (5 mg total) by mouth daily. Start taking on: November 18, 2020   aspirin EC 81 MG tablet Take 81 mg by mouth daily. Swallow whole.   atorvastatin 20 MG tablet Commonly known as: LIPITOR Take 20 mg by mouth at bedtime.   clopidogrel 75 MG tablet Commonly known as: PLAVIX TAKE 1 TABLET BY MOUTH EVERY DAY   Fish Oil 1000 MG Caps Take 1,000 mg by mouth daily.   magnesium oxide 400 (240 Mg) MG tablet Commonly known as: MAG-OX Take 400 mg by mouth daily.   nitroGLYCERIN 0.4 MG SL tablet Commonly known as: NITROSTAT Place 1 tablet (0.4 mg total) under the tongue as needed for chest pain.   pantoprazole 40 MG tablet Commonly known as: PROTONIX TAKE 1 TABLET BY MOUTH EVERY DAY        DISPOSITION AND FOLLOW-UP:  Kristen Dudley was  discharged from [facility] in Stable condition. At the hospital follow up visit please address:  Follow-up Recommendations: Consults: Cardiology Labs:  none Medications: aspirin/plavix, statin  Follow-up Appointments:  Follow-up Information     [facility]. Schedule an appointment as soon as possible for a visit in 1 week(s).   Why: hospital follow up Contact information: [address] [place] [address] [phone]                HOSPITAL COURSE:  Patient Summary: Kristen Dudley is a 87 y.o. female with a medical history significant for peripheral vascular disease, essential hypertension not on antihypertensive medications, hyperlipidemia, tobacco use,  and symptomatic severe 70-99% stenosis of the left carotid artery admitted for chest pain.   #Chest pain, atypical #Hx of coronary artery disease Patient presented with acute onset substernal chest pain radiating to the right arm and right scapula that was relieved with nitroglycerin and aspirin. She did not have significant EKG changes but did have mild elevation in troponins 13>39>66. Cardiology consulted. Suspect her elevated troponins are in setting of severe hypertension. Recommend for goal directed medical therapy. Echo showed EF of 60-65% with some LVH, consistent with grade I diastolic dysfunction.   #TIA #Carotid artery stenosis Patient has a history of TIA's in setting of carotid artery stenosis with intermittent  episodes of aphasia and confusion. Due to initial concerns for aphasia at home, CT Head wo Contrast obtained which was negative for acute intracranial abnormality. Patient's mental status was at baseline during my evaluation. However, noted to have acute confusion with aphasia for which Code Stroke activated. However, seems like these symptoms were transient and likely in setting of symptomatic carotid artery stenosis vs HTN encephalopathy.  MRI brain negative for acute intracranial pathology. Continued on aspirin, plavix, and atorvastatin therapy. Carotid ultrasound showing R ICA 1-39% stenosis and 60-79% stenosis of the L ICA, bilateral vertebral arteries demonstrate antegrade flow.  Symptoms were felt to favor HTN encephalopathy given findings on US of carotid.   #Hypertension  Patient does not have any antihypertensives at home. She was noted to be hypertensive on exam. Suspect this may have contributed to her chest pain and she was started on amlodipine 5mg  daily. BP goal is normotensive for this patient.   DISCHARGE INSTRUCTIONS:   Discharge Instructions     Amb Referral to Palliative Care   Complete by: As directed    Please discuss GOC with patient and family. Son unsure when to bring patient in to hospital and when not to.   Diet - low sodium heart healthy   Complete by: As directed    Increase activity slowly   Complete by: As directed        SUBJECTIVE:  Kristen Dudley was seen and evaluated at the bedside this morning. She reports that she is feeling well overall and she is ready to be discharged to home. She worked with PT and has no further needs on discharge. Patient denies any chest pain, palpitations, or shortness of breath.   Discharge Vitals:   BP 113/66 (BP Location: Left Arm)   Pulse 92   Temp 97.9 F (36.6 C) (Oral)   Resp 18   Ht 5\' 2"  (1.575 m)   Wt 78.2 kg   SpO2 97%   BMI 31.53 kg/m   OBJECTIVE:  Constitutional: elderly female, hard of hearing, well-developed and well-nourished. No distress.  Cardiovascular: Normal rate, regular rhythm, S1 and S2 present, no murmurs, rubs, gallops.  Distal pulses intact Respiratory: No respiratory distress, no accessory muscle use.Lungs are clear to auscultation bilaterally. GI: Nondistended, soft, nontender to palpation, normal bowel sounds Musculoskeletal: Normal bulk and tone.  No peripheral edema noted. Neurological: Is alert and oriented x4, some word  finding difficulty and intermittent confusion, strength 5/5 in all extremities and sensation grossly intact  Skin: Warm and dry.  No rash, erythema, lesions noted.  Pertinent Labs, Studies, and Procedures:  CBC Latest Ref Rng & Units 11/17/2020 11/16/2020 03/17/2018  WBC 4.0 - 10.5 K/uL 13.0(H) 10.3 10.0  Hemoglobin 12.0 - 15.0 g/dL 15.1(H) 15.7(H) 13.9  Hematocrit 36.0 - 46.0 % 45.9 48.3(H) 42.1  Platelets 150 - 400 K/uL 542(H) 512(H) 362    CMP Latest Ref Rng & Units 11/17/2020 11/16/2020 03/17/2018  Glucose 70 - 99 mg/dL 130(Q108(H) 657(Q133(H) 87  BUN 8 - 23 mg/dL 20 21 18   Creatinine 0.44 - 1.00 mg/dL 4.69(G1.37(H) 2.95(M1.32(H) 8.41(L1.10(H)  Sodium 135 - 145 mmol/L 136 138 139  Potassium 3.5 - 5.1 mmol/L 4.2 3.9 4.4  Chloride 98 - 111 mmol/L 99 104 101  CO2 22 - 32 mmol/L 27 25 29   Calcium 8.9 - 10.3 mg/dL 9.7 9.6 24.410.2  Total Protein 6.5 - 8.1 g/dL - 7.5 7.4  Total Bilirubin 0.3 - 1.2 mg/dL - 1.8(H) 1.0  Alkaline Phos 38 - 126 U/L -  81 -  AST 15 - 41 U/L - 29 17  ALT 0 - 44 U/L - 17 12    MR BRAIN WO CONTRAST  Result Date: 11/16/2020 CLINICAL DATA:. IMPRESSION: 1. No acute intracranial pathology. 2. Global parenchymal volume loss and advanced chronic white matter microangiopathy. Electronically Signed   By: Lesia Hausen M.D.   On: 11/16/2020 14:52    ECHOCARDIOGRAM COMPLETE  Result Date: 11/17/2020    ECHOCARDIOGRAM REPORT    IMPRESSIONS  1. Left ventricular ejection fraction, by estimation, is 60 to 65%. The left ventricle has normal function. The left ventricle has no regional wall motion abnormalities. There is mild left ventricular hypertrophy. Left ventricular diastolic parameters are consistent with Grade I diastolic dysfunction (impaired relaxation).  2. Right ventricular systolic function is normal. The right ventricular size is normal. Tricuspid regurgitation signal is inadequate for assessing PA pressure.  3. Left atrial size was mildly dilated.  4. The mitral valve is degenerative. No evidence of  mitral valve regurgitation. No evidence of mitral stenosis.  5. The aortic valve is tricuspid. Aortic valve regurgitation is not visualized. Mild aortic valve sclerosis is present, with no evidence of aortic valve stenosis.  6. The inferior vena cava is normal in size with greater than 50% respiratory variability, suggesting right atrial pressure of 3 mmHg.  CT HEAD CODE STROKE WO CONTRAST  Result Date: 11/16/2020 CLINICAL DATA:  Code stroke. EXAM: CT HEAD WITHOUT CONTRAST TECHNIQUE: Contiguous axial images were obtained from the base of the skull through the vertex without intravenous contrast. COMPARISON:  11/16/2020 8:55 a.m. FINDINGS: Brain: No evidence of acute infarction, hemorrhage, hydrocephalus, extra-axial collection or mass lesion/mass effect. Periventricular white matter changes, likely the sequela of chronic small vessel ischemic disease. Vascular: No hyperdense vessel or unexpected calcification. Skull: Normal. Negative for fracture or focal lesion. Sinuses/Orbits: Mucosal thickening in the right maxillary sinus and ethmoid air cells. Status post bilateral lens replacements. Other: Well aerated mastoids. ASPECTS [facility] Stroke Program Early CT Score) - Ganglionic level infarction (caudate, lentiform nuclei, internal capsule, insula, M1-M3 cortex): 7 - Supraganglionic infarction (M4-M6 cortex): 3 Total score (0-10 with 10 being normal): 10 IMPRESSION: 1. No acute intracranial process. 2. ASPECTS is 10 Code stroke imaging results were communicated on 11/16/2020 at 12:38 pm to provider Amada Jupiter via secure text paging. Electronically Signed   By: Wiliam Ke M.D.   On: 11/16/2020 12:39   VAS US CAROTID  Result Date: 11/17/2020 Summary: Right Carotid: Velocities in the right ICA are consistent with a 1-39% stenosis. Left Carotid: Velocities in the left ICA are consistent with a 60-79% stenosis. Vertebrals: Bilateral vertebral arteries demonstrate antegrade flow. *See table(s) above for  measurements and observations.     Preliminary     FOLLOW-UP INSTRUCTIONS:  Thank you for allowing [place] to be part of your care. You were hospitalized for Carotid artery stenosis, symptomatic.  Please follow up with the following providers: A. Please call to make an appointment to establish with a primary care provider  [facility]   [phone]  [address] [place] [address]     Please note these changes made to your medications:   A. Medications to continue: Current Meds  Medication Sig   acetaminophen (TYLENOL) 500 MG tablet Take 1,000 mg by mouth every 6 (six) hours as needed for mild pain.   albuterol (VENTOLIN HFA) 108 (90 Base) MCG/ACT inhaler Inhale 2 puffs into the lungs every  6 (six) hours as needed for wheezing or shortness of breath.   aspirin EC 81 MG tablet Take 81 mg by mouth daily. Swallow whole.   atorvastatin (LIPITOR) 20 MG tablet Take 20 mg by mouth at bedtime.   clopidogrel (PLAVIX) 75 MG tablet TAKE 1 TABLET BY MOUTH EVERY DAY (Patient taking differently: Take 75 mg by mouth daily.)   magnesium oxide (MAG-OX) 400 (240 Mg) MG tablet Take 400 mg by mouth daily.   Omega-3 Fatty Acids (FISH OIL) 1000 MG CAPS Take 1,000 mg by mouth daily.   pantoprazole (PROTONIX) 40 MG tablet TAKE 1 TABLET BY MOUTH EVERY DAY (Patient taking differently: Take 40 mg by mouth daily.)      B. Medications to start:    amLODipine (NORVASC) tablet 5 mg, 5 mg, Oral, Daily, Aslam, Sadia, MD, 5 mg at 11/17/20 1115    Please make sure to follow up with your cardiologist and establish with a primary care doctor. Start taking amlodipine 5mg  daily for your blood pressure.   Please call our clinic if you have any questions or concerns, we may be able to help and keep you from a long and expensive emergency room wait. Our clinic and after hours phone number is [phone], the best time to call is Monday through  Friday 9 am to 4 pm but there is always someone available 24/7 if you have an emergency. If you need medication refills please notify your pharmacy one week in advance and they will send Saturday a request.       Signed: [place], D.O.  Internal Medicine Resident, PGY-1 [facility]  Pager: [phone] 2:20 PM, 11/17/2020

## 2020-11-17 NOTE — Progress Notes (Signed)
Progress Note  Patient Name: Kristen Dudley Date of Encounter: 11/17/2020  [facility] Cardiologist: None   Subjective   Feels much better this morning.  No chest pain no shortness of breath no further speech disturbances.  Inpatient Medications    Scheduled Meds:  amLODipine  5 mg Oral Daily   aspirin EC  81 mg Oral Daily   atorvastatin  20 mg Oral QHS   clopidogrel  75 mg Oral Daily   enoxaparin (LOVENOX) injection  30 mg Subcutaneous Q24H   nitroGLYCERIN  0.4 mg Sublingual Once   pantoprazole  40 mg Oral Daily   sodium chloride flush  3 mL Intravenous Q12H   Continuous Infusions:  PRN Meds: acetaminophen **OR** acetaminophen, ondansetron   Vital Signs    Vitals:   11/16/20 1859 11/16/20 2025 11/17/20 0500 11/17/20 0740  BP: (!) 162/69 (!) 156/70 (!) 154/70 115/62  Pulse: 96 94 88 93  Resp: 18 18 16 18   Temp: 98.3 F (36.8 C) 97.8 F (36.6 C) 98 F (36.7 C) 99.1 F (37.3 C)  TempSrc: Oral Oral Axillary Oral  SpO2: 95% 97% 96% 94%  Weight:      Height:       No intake or output data in the 24 hours ending 11/17/20 0857 Last 3 Weights 11/16/2020 09/16/2019 04/29/2018  Weight (lbs) 172 lb 6.4 oz 172 lb 8 oz 169 lb 12.8 oz  Weight (kg) 78.2 kg 78.245 kg 77.021 kg      Telemetry    No adverse arrhythmias- Personally Reviewed  ECG    No new- Personally Reviewed  Physical Exam   GEN: No acute distress.   Neck: No JVD Cardiac: RRR, no murmurs, rubs, or gallops.  Respiratory: Clear to auscultation bilaterally. GI: Soft, nontender, non-distended  MS: No edema; No deformity. Neuro:  Nonfocal  Psych: Normal affect   Labs    High Sensitivity Troponin:   Recent Labs  Lab 11/16/20 0616 11/16/20 0815 11/16/20 1300 11/16/20 1326  TROPONINIHS 13 39* 67* 66*      Chemistry Recent Labs  Lab 11/16/20 0616 11/16/20 1300 11/17/20 0424  NA 138  --  136  K 3.9  --  4.2  CL 104  --  99  CO2 25  --  27  GLUCOSE 133*  --  108*  BUN 21  --  20   CREATININE 1.32*  --  1.37*  CALCIUM 9.6  --  9.7  PROT  --  7.5  --   ALBUMIN  --  4.1  --   AST  --  29  --   ALT  --  17  --   ALKPHOS  --  81  --   BILITOT  --  1.8*  --   GFRNONAA 38*  --  36*  ANIONGAP 9  --  10     Hematology Recent Labs  Lab 11/16/20 0616 11/17/20 0424  WBC 10.3 13.0*  RBC 5.30* 5.08  HGB 15.7* 15.1*  HCT 48.3* 45.9  MCV 91.1 90.4  MCH 29.6 29.7  MCHC 32.5 32.9  RDW 14.9 14.9  PLT 512* 542*    BNPNo results for input(s): BNP, PROBNP in the last 168 hours.   DDimer No results for input(s): DDIMER in the last 168 hours.   Radiology    DG Chest 2 View  Result Date: 11/16/2020 CLINICAL DATA:  Chest pain EXAM: CHEST - 2 VIEW COMPARISON:  03/17/2018 FINDINGS: Normal heart size and mediastinal contours. No  acute infiltrate or edema. Calcified nodules at the right base. No effusion or pneumothorax. No acute osseous findings. IMPRESSION: No active cardiopulmonary disease. Electronically Signed   By: Marnee Spring M.D.   On: 11/16/2020 06:54   CT HEAD WO CONTRAST ( )  Result Date: 11/16/2020 CLINICAL DATA:  Speech difficulty. EXAM: CT HEAD WITHOUT CONTRAST TECHNIQUE: Contiguous axial images were obtained from the base of the skull through the vertex without intravenous contrast. COMPARISON:  February 25, 2018. FINDINGS: Brain: Mild chronic ischemic white matter disease is noted. No mass effect or midline shift is noted. Ventricular size is within normal limits. There is no evidence of mass lesion, hemorrhage or acute infarction. Vascular: No hyperdense vessel or unexpected calcification. Skull: Normal. Negative for fracture or focal lesion. Sinuses/Orbits: No acute finding. Other: None. IMPRESSION: No acute intracranial abnormality seen. Electronically Signed   By: Lupita Raider M.D.   On: 11/16/2020 09:20   MR BRAIN WO CONTRAST  Result Date: 11/16/2020 CLINICAL DATA:  Neuro deficit, stroke suspected EXAM: MRI HEAD WITHOUT CONTRAST TECHNIQUE:  Multiplanar, multiecho pulse sequences of the brain and surrounding structures were obtained without intravenous contrast. COMPARISON:  Same day CT/CTA head FINDINGS: Brain: There is no evidence of acute intracranial hemorrhage, extra-axial fluid collection, or infarct. There are remote lacunar infarcts in the right frontal lobe white matter and right cerebellar hemisphere. There is extensive FLAIR signal abnormality throughout the subcortical and periventricular white matter consistent with advanced chronic white matter microangiopathy. There is moderate parenchymal volume loss with commensurate enlargement of the ventricular system. No mass lesion is identified. There is no midline shift. Vascular: Normal flow voids. Skull and upper cervical spine: Normal marrow signal. Sinuses/Orbits: The paranasal sinuses are clear. The patient is status post bilateral lens implant. The globes and orbits are otherwise unremarkable. The mastoid air cells are clear. Other: None. IMPRESSION: 1. No acute intracranial pathology. 2. Global parenchymal volume loss and advanced chronic white matter microangiopathy. Electronically Signed   By: Lesia Hausen M.D.   On: 11/16/2020 14:52   CT HEAD CODE STROKE WO CONTRAST  Result Date: 11/16/2020 CLINICAL DATA:  Code stroke. EXAM: CT HEAD WITHOUT CONTRAST TECHNIQUE: Contiguous axial images were obtained from the base of the skull through the vertex without intravenous contrast. COMPARISON:  11/16/2020 8:55 a.m. FINDINGS: Brain: No evidence of acute infarction, hemorrhage, hydrocephalus, extra-axial collection or mass lesion/mass effect. Periventricular white matter changes, likely the sequela of chronic small vessel ischemic disease. Vascular: No hyperdense vessel or unexpected calcification. Skull: Normal. Negative for fracture or focal lesion. Sinuses/Orbits: Mucosal thickening in the right maxillary sinus and ethmoid air cells. Status post bilateral lens replacements. Other: Well  aerated mastoids. ASPECTS [facility] Stroke Program Early CT Score) - Ganglionic level infarction (caudate, lentiform nuclei, internal capsule, insula, M1-M3 cortex): 7 - Supraganglionic infarction (M4-M6 cortex): 3 Total score (0-10 with 10 being normal): 10 IMPRESSION: 1. No acute intracranial process. 2. ASPECTS is 10 Code stroke imaging results were communicated on 11/16/2020 at 12:38 pm to provider Amada Jupiter via secure text paging. Electronically Signed   By: Wiliam Ke M.D.   On: 11/16/2020 12:39   CT ANGIO HEAD NECK W WO CM (CODE STROKE)  Result Date: 11/16/2020 CLINICAL DATA:  Code stroke follow-up EXAM: CT ANGIOGRAPHY HEAD AND NECK TECHNIQUE: Multidetector CT imaging of the head and neck was performed using the standard protocol during bolus administration of intravenous contrast. Multiplanar CT image reconstructions and MIPs were obtained to evaluate the vascular anatomy. Carotid stenosis measurements (when  applicable) are obtained utilizing NASCET criteria, using the distal internal carotid diameter as the denominator. CONTRAST:  64mL OMNIPAQUE IOHEXOL 350 MG/ML SOLN COMPARISON:  None. FINDINGS: CTA NECK Aortic arch: Mixed plaque along the arch and patent great vessel origins. Right carotid system: Patent. Calcified plaque along the proximal internal carotid resulting in approximately 60% stenosis. Left carotid system: Patent. Mixed plaque along the proximal internal carotid causing approximately 50% stenosis. Vertebral arteries: Patent and codominant. Noncalcified plaque at the origins. There is mild to moderate stenosis. Skeleton: Degenerative changes of the cervical spine. Other neck: Multinodular thyroid. No ultrasound follow-up is recommended. Upper chest: Mild emphysema. Review of the MIP images confirms the above findings CTA HEAD Anterior circulation: Intracranial internal carotid arteries are patent with calcified plaque causing mild stenosis. Anterior and middle cerebral arteries are  patent. Posterior circulation: Intracranial vertebral arteries are patent. There is a possible 2 mm broad-based outpouching along the posterior surface of the left vertebral artery disc proximal to the PICA origin. Basilar artery is patent. Major cerebellar artery origins are patent. Posterior cerebral arteries are patent. Moderate stenosis of the distal left P1 PCA. Venous sinuses: Patent as allowed by contrast bolus timing. Review of the MIP images confirms the above findings IMPRESSION: No large vessel occlusion. Plaque at the proximal right ICA causes 60% stenosis. Plaque at the proximal left ICA causes 50% stenosis. Moderate stenosis distal left P1 PCA. Possible 2 mm broad-based outpouching just proximal to the left PICA origin. May reflect atherosclerotic irregularity or a small aneurysm. Electronically Signed   By: Guadlupe Spanish M.D.   On: 11/16/2020 13:12    Cardiac Studies   Prior echo 2019 showed normal EF 55 to 60% with mitral annular calcification.  Patient Profile     88 y.o. female with left ICA stenosis, transient speech disturbance, chest discomfort  Assessment & Plan    Chest discomfort - Troponin mildly elevated peaking at 66.  Fairly low.  If chest pain were to return, I think initiating a long-acting nitrate such as isosorbide 30 mg once a day would be reasonable.  It is certainly possible that she could at the age of 72 with peripheral vascular disease noted have underlying coronary artery disease.  I would not be a proponent of invasive therapies unless absolutely necessary. - Continue with goal-directed medical therapy such as dual antiplatelet therapy, statin use.  Elevated troponin - Minimally elevated peaking at 66.  Certainly this can be a result of demand ischemia in the setting of significant hypertension upon arrival.  Discussed with her son Onalee Hua.  Once again try to avoid any invasive testing.  Continue to treat medically.  This does not appear to be true ACS.  EKG  shows no ischemic changes.  Transient speech disturbance - Left internal carotid artery 60% on recent scan here.  Per neurology.  On dual antiplatelet therapy.  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     For questions or updates, please contact [facility] Please consult [email or web address] for contact info under        Signed, Donato Schultz, MD  11/17/2020, 8:57 AM

## 2020-11-17 NOTE — Progress Notes (Signed)
  Date: 11/17/2020  Patient name: Kristen Dudley  Medical record number: [id number]  Date of birth: [birth year]   I have seen and evaluated Flonnie Hailstone and discussed their care with the Residency Team. Briefly, Ms. Kniskern presented for CP, SOB and also concern for AMS.  She was worked up for ACS with TnI which trended up to 66.  Cardiology was consulted and did not feel invasive therapy was indicated.  If she had further chest pain, add IMDUR.  Her speech changes have improved.  MRI brain was negative for any acute findings.  She was noted to be back to baseline and excited to return home today.   Vitals:   11/17/20 1115 11/17/20 1115  BP: 113/66 113/66  Pulse:  92  Resp:  18  Temp:  97.9 F (36.6 C)  SpO2:  97%   Gen: Awake, alert, no dysarthria or acute distress Eyes: Anicteric sclerae Pulm: Breathing comfortably on room air, no wheezing Psych: Pleasant, normal mood  Assessment and Plan: I have seen and evaluated the patient as outlined above. I agree with the formulated Assessment and Plan as detailed in the residents' note, with the following changes:   1. Chest pain, h/o CAD - GDMT per cardiology - Add IMDUR if needed - Carotid ultrasound done, improved from May 2022  2. Dysarthria - EEG without seizure - MRI without stroke - Continue to treat with DAPT for 2nd stroke prevention.  - Neurology has seen patient, recommends BP goal to be normotensive  Other issues per resident team note.   Inez Catalina, MD 8/19/20225:19 PM

## 2020-11-17 NOTE — Discharge Instructions (Signed)
FOLLOW-UP INSTRUCTIONS:  Thank you for allowing [place] to be part of your care. You were hospitalized for Carotid artery stenosis, symptomatic.  Please follow up with the following providers: A. Please call to make an appointment to establish with a primary care provider  [facility]   [phone]  [address] [place] [address]     Please note these changes made to your medications:   A. Medications to continue: Current Meds  Medication Sig   acetaminophen (TYLENOL) 500 MG tablet Take 1,000 mg by mouth every 6 (six) hours as needed for mild pain.   albuterol (VENTOLIN HFA) 108 (90 Base) MCG/ACT inhaler Inhale 2 puffs into the lungs every 6 (six) hours as needed for wheezing or shortness of breath.   aspirin EC 81 MG tablet Take 81 mg by mouth daily. Swallow whole.   atorvastatin (LIPITOR) 20 MG tablet Take 20 mg by mouth at bedtime.   clopidogrel (PLAVIX) 75 MG tablet TAKE 1 TABLET BY MOUTH EVERY DAY (Patient taking differently: Take 75 mg by mouth daily.)   magnesium oxide (MAG-OX) 400 (240 Mg) MG tablet Take 400 mg by mouth daily.   Omega-3 Fatty Acids (FISH OIL) 1000 MG CAPS Take 1,000 mg by mouth daily.   pantoprazole (PROTONIX) 40 MG tablet TAKE 1 TABLET BY MOUTH EVERY DAY (Patient taking differently: Take 40 mg by mouth daily.)      B. Medications to start:    amLODipine (NORVASC) tablet 5 mg, 5 mg, Oral, Daily, Aslam, Sadia, MD, 5 mg at 11/17/20 1115    Please make sure to follow up with your cardiologist and establish with a primary care doctor. Start taking amlodipine 5mg  daily for your blood pressure.   Please call our clinic if you have any questions or concerns, we may be able to help and keep you from a long and expensive emergency room wait. Our clinic and after hours phone number is [phone], the best time to call is Monday through Friday 9 am to 4 pm but there is always someone  available 24/7 if you have an emergency. If you need medication refills please notify your pharmacy one week in advance and they will send Wednesday a request.

## 2020-11-17 NOTE — Evaluation (Addendum)
Physical Therapy Evaluation & Discharge Patient Details Name: Kristen Dudley MRN: [id number] DOB: 1927/08/31 Today's Date: 11/17/2020   History of Present Illness  87 y/o female presented to ED on 8/18 for complaints of chest pain that radiates to neck and R arm and confusion. CT head and MRI negative for acute abnormalities. CTA with 60% stenosis R ICA and 50% stenosis with proximal L ICA and possible 2 mm outpuching proximal to L PICA origin (may reflect atherosclerotic irregularity or small aneurysm). PMH: PAD, HTN, HLD, chronic renal sufficiency, symptomatic L carotid artery stenosis with recent TIA.  Clinical Impression  PTA, patient lives with son and daughter-in-law and reports independence with mobility and ADLs. Patient currently functioning at supervision level for mobility with no AD. Patient presents generalized weakness and decreased activity tolerance. Educated patient and son on importance of mobility and need for supervision initially for safety, patient and son verbalized understanding. No further skilled PT needs required acutely. No PT follow up recommended at this time. Recommend 24 hour supervision initially at discharge for safety.    Follow Up Recommendations No PT follow up;Supervision/Assistance - 24 hour    Equipment Recommendations  None recommended by PT    Recommendations for Other Services       Precautions / Restrictions Precautions Precautions: Fall Restrictions Weight Bearing Restrictions: No      Mobility  Bed Mobility Overal bed mobility: Modified Independent                  Transfers Overall transfer level: Modified independent Equipment used: None             General transfer comment: increased time required but able to do it independent  Ambulation/Gait Ambulation/Gait assistance: Supervision Gait Distance (Feet): 100 Feet (x100') Assistive device: None Gait Pattern/deviations: Step-through pattern;Decreased stride  length;Drifts right/left Gait velocity: decreased   General Gait Details: supervision for safety. Minor LOB during ambulation but patient able to self correct  Stairs Stairs: Yes Stairs assistance: Supervision Stair Management: Two rails;Step to pattern;Forwards Number of Stairs: 3 General stair comments: supervision for safety  Wheelchair Mobility    Modified Rankin (Stroke Patients Only)       Balance Overall balance assessment: Mild deficits observed, not formally tested                                           Pertinent Vitals/Pain Pain Assessment: No/denies pain    Home Living Family/patient expects to be discharged to:: Private residence Living Arrangements: Children Available Help at Discharge: Family;Available 24 hours/day Type of Home: House Home Access: Stairs to enter Entrance Stairs-Rails: Right Entrance Stairs-Number of Steps: 2 Home Layout: One level Home Equipment: Grab bars - tub/shower      Prior Function Level of Independence: Independent         Comments: not driving, independent with ADLs and mobility per patient     Hand Dominance        Extremity/Trunk Assessment   Upper Extremity Assessment Upper Extremity Assessment: Defer to OT evaluation    Lower Extremity Assessment Lower Extremity Assessment: Generalized weakness    Cervical / Trunk Assessment Cervical / Trunk Assessment: Kyphotic  Communication   Communication: HOH  Cognition Arousal/Alertness: Awake/alert Behavior During Therapy: WFL for tasks assessed/performed Overall Cognitive Status: Within Functional Limits for tasks assessed  General Comments: at baseline mentation and age appropriate. Patient able to problem solve and follow all commands      General Comments      Exercises     Assessment/Plan    PT Assessment Patent does not need any further PT services  PT Problem List         PT  Treatment Interventions      PT Goals (Current goals can be found in the Care Plan section)  Acute Rehab PT Goals Patient Stated Goal: to go home PT Goal Formulation: All assessment and education complete, DC therapy    Frequency     Barriers to discharge        Co-evaluation               AM-PAC PT "6 Clicks" Mobility  Outcome Measure Help needed turning from your back to your side while in a flat bed without using bedrails?: None Help needed moving from lying on your back to sitting on the side of a flat bed without using bedrails?: None Help needed moving to and from a bed to a chair (including a wheelchair)?: None Help needed standing up from a chair using your arms (e.g., wheelchair or bedside chair)?: None Help needed to walk in hospital room?: A Little Help needed climbing 3-5 steps with a railing? : A Little 6 Click Score: 22    End of Session   Activity Tolerance: Patient tolerated treatment well Patient left: in bed;with call bell/phone within reach (with ECG) Nurse Communication: Mobility status PT Visit Diagnosis: Unsteadiness on feet (R26.81);Muscle weakness (generalized) (M62.81)    Time: 4098-1191 PT Time Calculation (min) (ACUTE ONLY): 25 min   Charges:   PT Evaluation $PT Eval Moderate Complexity: 1 Mod          Lebaron Bautch A. Dan Humphreys PT, DPT Acute Rehabilitation Services Pager [phone] Office [phone]   Viviann Spare 11/17/2020, 10:42 AM

## 2020-11-17 NOTE — Evaluation (Signed)
Occupational Therapy Evaluation Patient Details Name: Kristen Dudley MRN: [id number] DOB: 07/21/1927 Today's Date: 11/17/2020    History of Present Illness 87 y/o female presented to ED on 8/18 for complaints of chest pain that radiates to neck and R arm and confusion. CT head and MRI negative for acute abnormalities. CTA with 60% stenosis R ICA and 50% stenosis with proximal L ICA and possible 2 mm outpuching proximal to L PICA origin (may reflect atherosclerotic irregularity or small aneurysm). PMH: PAD, HTN, HLD, chronic renal sufficiency, symptomatic L carotid artery stenosis with recent TIA.   Clinical Impression   PTA, pt was living with her son and was independent. Currently, pt requires Supervision for ADLs and functional mobility. Pt presenting near baseline; slight decrease in balance and activity tolerance. Very motivated to return home. Discussed need for increased initial supervision with son. Answered all pt questions. Recommend dc home once medically stable per physician. All acute OT needs met and will sign off. Thank you.    Follow Up Recommendations  No OT follow up;Supervision/Assistance - 24 hour (24/7 for first couple of days)    Equipment Recommendations  None recommended by OT    Recommendations for Other Services PT consult     Precautions / Restrictions Precautions Precautions: Fall Restrictions Weight Bearing Restrictions: No      Mobility Bed Mobility Overal bed mobility: Modified Independent                  Transfers Overall transfer level: Modified independent Equipment used: None             General transfer comment: increased time    Balance Overall balance assessment: No apparent balance deficits (not formally assessed)                                         ADL either performed or assessed with clinical judgement   ADL Overall ADL's : Needs assistance/impaired     Grooming: Oral care;Supervision/safety                Lower Body Dressing: Supervision/safety;Sit to/from Market researcher Details (indicate cue type and reason): Practicing tub transfer with supervision Functional mobility during ADLs: Supervision/safety General ADL Comments: Supervision for ADLs and functional mobility for general safety     Vision         Perception     Praxis      Pertinent Vitals/Pain Pain Assessment: No/denies pain     Hand Dominance Right   Extremity/Trunk Assessment Upper Extremity Assessment Upper Extremity Assessment: Generalized weakness   Lower Extremity Assessment Lower Extremity Assessment: Defer to PT evaluation   Cervical / Trunk Assessment Cervical / Trunk Assessment: Kyphotic   Communication Communication Communication: HOH   Cognition Arousal/Alertness: Awake/alert Behavior During Therapy: WFL for tasks assessed/performed Overall Cognitive Status: Within Functional Limits for tasks assessed                                 General Comments: at baseline mentation and age appropriate. Patient able to problem solve and follow all commands   General Comments       Exercises     Shoulder Instructions      Home Living Family/patient expects to be discharged to:: Private residence  Living Arrangements: Children Available Help at Discharge: Family;Available 24 hours/day Type of Home: House Home Access: Stairs to enter [employer] of Steps: 2 Entrance Stairs-Rails: Right Home Layout: One level     Bathroom Shower/Tub: Teacher, early years/pre: Standard     Home Equipment: Grab bars - tub/shower          Prior Functioning/Environment Level of Independence: Independent        Comments: not driving, independent with ADLs and mobility per patient        OT Problem List:        OT Treatment/Interventions:      OT Goals(Current goals can be found in the care plan section) Acute Rehab OT  Goals Patient Stated Goal: Go home please OT Goal Formulation: All assessment and education complete, DC therapy ADL Goals Pt Will Perform Grooming: with modified independence;standing Pt Will Transfer to Toilet: with modified independence;ambulating;regular height toilet Pt Will Perform Toileting - Clothing Manipulation and hygiene: with modified independence;sitting/lateral leans;sit to/from stand  OT Frequency:     Barriers to D/C:            Co-evaluation              AM-PAC OT "6 Clicks" Daily Activity     Outcome Measure Help from another person eating meals?: None Help from another person taking care of personal grooming?: A Little Help from another person toileting, which includes using toliet, bedpan, or urinal?: A Little Help from another person bathing (including washing, rinsing, drying)?: A Little Help from another person to put on and taking off regular upper body clothing?: None Help from another person to put on and taking off regular lower body clothing?: A Little 6 Click Score: 20   End of Session Nurse Communication: Mobility status  Activity Tolerance: Patient tolerated treatment well Patient left: in chair;with call bell/phone within reach  OT Visit Diagnosis: Unsteadiness on feet (R26.81);Other abnormalities of gait and mobility (R26.89);Muscle weakness (generalized) (M62.81)                Time: 5170-0174 OT Time Calculation (min): 21 min Charges:  OT General Charges $OT Visit: 1 Visit OT Evaluation $OT Eval Moderate Complexity: [place], OTR/L Acute Rehab Pager: [phone] Office: [place] 11/17/2020, 10:48 AM

## 2020-11-17 NOTE — Procedures (Signed)
Patient Name: Kristen Dudley  MRN: [id number]  Epilepsy Attending: Charlsie Quest  Referring Physician/Provider: Leda Gauze, NP Date: 11/17/2020 Duration: 24.38 mins  Patient history: 87 yo female with known Left ICA stenosis on DAPT who presented with chest pain and transient 30-45 minutes of speech disturbance with speech arrest. EEG to evaluate for seizure  Level of alertness: Awake, asleep  AEDs during EEG study: None  Technical aspects: This EEG study was done with scalp electrodes positioned according to the 10-20 International system of electrode placement. Electrical activity was acquired at a sampling rate of 500Hz  and reviewed with a high frequency filter of 70Hz  and a low frequency filter of 1Hz . EEG data were recorded continuously and digitally stored.   Description: The posterior dominant rhythm consists of 9 Hz activity of moderate voltage (25-35 uV) seen predominantly in posterior head regions, symmetric and reactive to eye opening and eye closing. Sleep was characterized by vertex waves, sleep spindles (12 to 14 Hz), maximal frontocentral region.   Hyperventilation and photic stimulation were not performed.     IMPRESSION: This study is within normal limits. No seizures or epileptiform discharges were seen throughout the recording.  Kathya Wilz 

## 2020-11-17 NOTE — Plan of Care (Signed)

## 2020-11-17 NOTE — Progress Notes (Signed)
Carotid duplex has been completed.   Preliminary results in CV Proc.   Blanch Media 11/17/2020 11:19 AM

## 2020-11-17 NOTE — Progress Notes (Signed)
  Echocardiogram 2D Echocardiogram has been performed.  Augustine Radar 11/17/2020, 12:58 PM

## 2020-12-04 ENCOUNTER — Other Ambulatory Visit: Payer: Self-pay | Admitting: Internal Medicine

## 2020-12-04 ENCOUNTER — Other Ambulatory Visit: Payer: Self-pay | Admitting: Family Medicine

## 2020-12-04 DIAGNOSIS — R109 Unspecified abdominal pain: Secondary | ICD-10-CM

## 2020-12-13 ENCOUNTER — Other Ambulatory Visit (HOSPITAL_COMMUNITY): Payer: Self-pay

## 2020-12-16 ENCOUNTER — Other Ambulatory Visit: Payer: Self-pay | Admitting: Internal Medicine

## 2020-12-17 ENCOUNTER — Other Ambulatory Visit: Payer: Self-pay | Admitting: Internal Medicine

## 2021-02-04 ENCOUNTER — Emergency Department
Admission: EM | Admit: 2021-02-04 | Discharge: 2021-02-04 | Disposition: A | Discharge status: Home / Self Care | Payer: Medicare Other | Attending: Emergency Medicine | Admitting: Emergency Medicine

## 2021-02-04 ENCOUNTER — Encounter: Payer: Self-pay | Admitting: Emergency Medicine

## 2021-02-04 ENCOUNTER — Other Ambulatory Visit: Payer: Self-pay

## 2021-02-04 DIAGNOSIS — N183 Chronic kidney disease, stage 3 unspecified: Secondary | ICD-10-CM | POA: Diagnosis not present

## 2021-02-04 DIAGNOSIS — Z87891 Personal history of nicotine dependence: Secondary | ICD-10-CM | POA: Insufficient documentation

## 2021-02-04 DIAGNOSIS — K625 Hemorrhage of anus and rectum: Secondary | ICD-10-CM | POA: Diagnosis present

## 2021-02-04 DIAGNOSIS — Z7982 Long term (current) use of aspirin: Secondary | ICD-10-CM | POA: Insufficient documentation

## 2021-02-04 LAB — COMPREHENSIVE METABOLIC PANEL
ALT: 18 U/L (ref 0–44)
AST: 19 U/L (ref 15–41)
Albumin: 4.4 g/dL (ref 3.5–5.0)
Alkaline Phosphatase: 78 U/L (ref 38–126)
Anion gap: 8 (ref 5–15)
BUN: 18 mg/dL (ref 8–23)
CO2: 28 mmol/L (ref 22–32)
Calcium: 10.1 mg/dL (ref 8.9–10.3)
Chloride: 102 mmol/L (ref 98–111)
Creatinine, Ser: 1.16 mg/dL — ABNORMAL HIGH (ref 0.44–1.00)
GFR, Estimated: 45 mL/min — ABNORMAL LOW (ref >60)
Glucose, Bld: 97 mg/dL (ref 70–99)
Potassium: 4.9 mmol/L (ref 3.5–5.1)
Sodium: 138 mmol/L (ref 135–145)
Total Bilirubin: 1.6 mg/dL — ABNORMAL HIGH (ref 0.3–1.2)
Total Protein: 8.1 g/dL (ref 6.5–8.1)

## 2021-02-04 LAB — CBC
HCT: 48.1 % — ABNORMAL HIGH (ref 36.0–46.0)
Hemoglobin: 16 g/dL — ABNORMAL HIGH (ref 12.0–15.0)
MCH: 30.1 pg (ref 26.0–34.0)
MCHC: 33.3 g/dL (ref 30.0–36.0)
MCV: 90.6 fL (ref 80.0–100.0)
Platelets: 525 10*3/uL — ABNORMAL HIGH (ref 150–400)
RBC: 5.31 MIL/uL — ABNORMAL HIGH (ref 3.87–5.11)
RDW: 13.9 % (ref 11.5–15.5)
WBC: 10.3 10*3/uL (ref 4.0–10.5)
nRBC: 0 % (ref 0.0–0.2)

## 2021-02-04 LAB — PROTIME-INR
INR: 1.1 (ref 0.8–1.2)
Prothrombin Time: 13.9 seconds (ref 11.4–15.2)

## 2021-02-04 LAB — HEMOGLOBIN AND HEMATOCRIT, BLOOD
HCT: 49 % — ABNORMAL HIGH (ref 36.0–46.0)
Hemoglobin: 16 g/dL — ABNORMAL HIGH (ref 12.0–15.0)

## 2021-02-04 NOTE — ED Triage Notes (Signed)
Pt reports that she has a history of rectal bleeding and she noticed two episodes last night and today she had one episode of dark stool. She does feel dizzy with this. Color is WNL at this time.

## 2021-02-04 NOTE — ED Notes (Signed)
Pt to ED via POV for rectal bleeding since last night. Pt is not c/o abdominal pain. Denies vomiting. Pt is in NAD. Color is WNL.

## 2021-02-04 NOTE — ED Notes (Addendum)
Rn to bedside.  MD at bedside.   Family advised pt had some rectal bleeding last night. So they came into the ER. Pt advised she does have hemorrhoids that bleed.

## 2021-02-04 NOTE — ED Provider Notes (Signed)
[facility] Emergency Department Provider Note   ____________________________________________   Event Date/Time   First MD Initiated Contact with Patient 02/04/21 1717     (approximate)  I have reviewed the triage vital signs and the nursing notes.   HISTORY  Chief Complaint Rectal Bleeding    HPI Kristen Dudley is a 87 y.o. female with a history of carotid stenosis on aspirin and Plavix, peripheral vascular disease abdominal aortic aneurysm and hemorrhoids  Patient noticed yesterday that there was some blood in the toilet bowl after stooling.  Also this morning noticed the same.  Son reports it is kind of hard to quantify but it seemed like there was blood that had settled to the bottom of the toilet bowl with brown stool present  Patient reports that from time to time she has had hemorrhoids cause similar bleeding.  She not having any pain.  She is not noticed that her stool is black.  She not been vomiting or feeling nauseated or weak.  No fevers.  No stomach pain or abdominal pain   Past Medical History:  Diagnosis Date   Hearing loss    PVD (peripheral vascular disease) ([place])     Patient Active Problem List   Diagnosis Date Noted   Carotid artery stenosis, symptomatic 11/17/2020   Chest pain    Acute cerebrovascular insufficiency transient focal neurologic deficit 11/16/2020   Hyperlipidemia 09/16/2019   Abdominal aortic aneurysm (AAA) without rupture 09/16/2019   Senile purpura ([place]) 04/29/2018   Macular degeneration of both eyes 04/29/2018   Mild acid reflux 04/29/2018   Obesity (BMI 30.0-34.9) 04/29/2018   Stage 3 chronic kidney disease ([place]) 04/29/2018   PVD (peripheral vascular disease) ([place]) 03/17/2018   History of femur fracture 03/17/2018    Past Surgical History:  Procedure Laterality Date   ABDOMINAL HYSTERECTOMY     CATARACT EXTRACTION, BILATERAL  2007   CHOLECYSTECTOMY OPEN  1961   IR ERCP [place] REM STENTS BIL PAN DUCT  INC DIL EA STENT [place] Right 2003   leg stents   ORIF FEMUR FRACTURE Right 2014    Prior to Admission medications   Medication Sig Start Date End Date Taking? Authorizing Provider  acetaminophen (TYLENOL) 500 MG tablet Take 1,000 mg by mouth every 6 (six) hours as needed for mild pain.    [provider]  albuterol (VENTOLIN HFA) 108 (90 Base) MCG/ACT inhaler Inhale 2 puffs into the lungs every 6 (six) hours as needed for wheezing or shortness of breath. 12/07/19   [provider]  amLODipine (NORVASC) 5 MG tablet Take 1 tablet (5 mg total) by mouth daily. 11/18/20 12/18/20  Atway, Jeananne Rama, DO  aspirin EC 81 MG tablet Take 81 mg by mouth daily. Swallow whole.    [provider]  atorvastatin (LIPITOR) 20 MG tablet Take 20 mg by mouth at bedtime. 08/07/20   [provider]  clopidogrel (PLAVIX) 75 MG tablet TAKE 1 TABLET BY MOUTH EVERY DAY Patient taking differently: Take 75 mg by mouth daily. 09/04/20   Delsa Grana, PA-C  magnesium oxide (MAG-OX) 400 (240 Mg) MG tablet Take 400 mg by mouth daily. 09/06/20   [provider]  nitroGLYCERIN (NITROSTAT) 0.4 MG SL tablet Place 1 tablet (0.4 mg total) under the tongue as needed for chest pain. 11/17/20 12/17/20  Atway, Jeananne Rama, DO  Omega-3 Fatty Acids (FISH OIL) 1000 MG CAPS Take 1,000 mg by mouth daily. 08/07/20   [provider]  ondansetron [facility])  4 MG tablet Take 1 tablet (4 mg total) by mouth daily as needed for nausea or vomiting. 11/17/20 11/17/21  Merrilyn Puma, MD  pantoprazole (PROTONIX) 40 MG tablet TAKE 1 TABLET BY MOUTH EVERY DAY Patient taking differently: Take 40 mg by mouth daily. 12/10/19   Danelle Berry, PA-C    Allergies Codeine and Iohexol  Family History  Problem Relation Age of Onset   Breast cancer Mother    Heart disease Father     Social History Social History   Tobacco Use   Smoking status: Former    Packs/day: 0.50    Years: 50.00    Pack years: 25.00    Types:  Cigarettes    Quit date: 05/01/2001    Years since quitting: 19.7   Smokeless tobacco: Never  Vaping Use   Vaping Use: Never used  Substance Use Topics   Alcohol use: Never   Drug use: Never    Review of Systems Constitutional: No fever/chills Cardiovascular: Denies chest pain. Respiratory: Denies shortness of breath. Gastrointestinal: No abdominal pain.  See HPI Genitourinary: Negative for dysuria. Musculoskeletal: Negative for back pain. Skin: Negative for rash. Neurological: Negative for weakness.    ____________________________________________   PHYSICAL EXAM:  VITAL SIGNS: ED Triage Vitals  Enc Vitals Group     BP 02/04/21 1020 (!) 146/67     Pulse Rate 02/04/21 1020 85     Resp 02/04/21 1445 18     Temp 02/04/21 1020 97.9 F (36.6 C)     Temp Source 02/04/21 1020 Oral     SpO2 02/04/21 1020 96 %     Weight 02/04/21 1044 170 lb (77.1 kg)     Height 02/04/21 1044 5\' 3"  (1.6 m)     Head Circumference --      Peak Flow --      Pain Score 02/04/21 1020 0     Pain Loc --      Pain Edu? --      Excl. in GC? --     Constitutional: Alert and oriented. Well appearing and in no acute distress. Eyes: Conjunctivae are normal. Head: Atraumatic. Nose: No congestion/rhinnorhea. Mouth/Throat: Mucous membranes are moist. Neck: No stridor.  Cardiovascular: Normal rate, regular rhythm. Grossly normal heart sounds.  Good peripheral circulation. Respiratory: Normal respiratory effort.  No retractions. Lungs CTAB. Gastrointestinal: Soft and nontender. No distention.  No abdominal bruit.  No palpable masses. Rectal exam performed with nurse Crystal.  External exam demonstrates a small external slightly thrombosed hemorrhoid.  In addition, internal examination demonstrates brown stool, but does not have a's very faint the heme positive test on Hemoccult.  There is no evidence of active bleeding or gross hemorrhage at this time.  No impaction. Musculoskeletal: No lower extremity  tenderness nor edema. Neurologic:  Normal speech and language. No gross focal neurologic deficits are appreciated.  Skin:  Skin is warm, dry and intact. No rash noted. Psychiatric: Mood and affect are normal. Speech and behavior are normal.  ____________________________________________   LABS (all labs ordered are listed, but only abnormal results are displayed)  Labs Reviewed  COMPREHENSIVE METABOLIC PANEL - Abnormal; Notable for the following components:      Result Value   Creatinine, Ser 1.16 (*)    Total Bilirubin 1.6 (*)    GFR, Estimated 45 (*)    All other components within normal limits  CBC - Abnormal; Notable for the following components:   RBC 5.31 (*)    Hemoglobin 16.0 (*)  HCT 48.1 (*)    Platelets 525 (*)    All other components within normal limits  HEMOGLOBIN AND HEMATOCRIT, BLOOD - Abnormal; Notable for the following components:   Hemoglobin 16.0 (*)    HCT 49.0 (*)    All other components within normal limits  PROTIME-INR  POC OCCULT BLOOD, ED   ____________________________________________  EKG   ____________________________________________  RADIOLOGY   ____________________________________________   PROCEDURES  Procedure(s) performed: None  Procedures  Critical Care performed: No  ____________________________________________   INITIAL IMPRESSION / ASSESSMENT AND PLAN / ED COURSE  Pertinent labs & imaging results that were available during my care of the patient were reviewed by me and considered in my medical decision making (see chart for details).   Patient reports having 2 stools for the noted to have some blood in them.  On examination she does have small thrombosed external hemorrhoid which may be source, but also has brown stool.  No associated abdominal pain no systemic or infectious symptoms.  She is on both aspirin and Plavix.  Currently no signs or symptoms to suggest acute major hemorrhage, diverticulitis, or other obvious  acute intra-abdominal cause for blood in stool.  She does have a known history of abdominal aorta which has been monitored, but no repair nothing to suggest fistula  Patient with normal hemodynamics.  Comfortably resting without abdominal pain.  Last saw some blood mixed with brown stool this morning.  Seems to be suggestive of more of a rectal bleeding or possible hemorrhoidal type bleeding, but will check serial hemoglobin here.  It has been several hours since her first hemoglobin was drawn  Case reviewed with Dr. Otelia Limes of neurology as well to discuss if we could potentially hold aspirin or Plavix while awaiting outpatient GI follow-up if hemoglobin stable, and he advises that given the patient's previous history reviewed actually recommend she continue on both aspirin and Plavix for stroke prevention and due to concerns for potential stroke during rebound after discontinuation.  He does recommend continuation of her antiplatelet therapy  ----------------------------------------- 6:28 PM on 02/04/2021 ----------------------------------------- Discussed with patient and her son.  Both agreeable with plan to monitor for worsening symptoms or additional bleeding as outpatient and follow-up closely with GI.  Urgent referral placed  Also, son reports that they are interested in engaging in palliative care services, gave him information on local palliative providers whom they can engage.  They also have a PCP in [place] whom they can follow-up with in the short-term.  Return precautions and treatment recommendations and follow-up discussed with the patient who is agreeable with the plan.   Hemoglobin stable.  No evidence of ongoing bleeding.  She will continue her current prescription including PPI  My overall gestalt is that this is likely hemorrhoidal in nature, but certainly does necessitate careful return precautions which I discussed with the patient and her son as well as plan for follow-up  with GI.      ____________________________________________   FINAL CLINICAL IMPRESSION(S) / ED DIAGNOSES  Final diagnoses:  Rectal bleeding        Note:  This document was prepared using Dragon voice recognition software and may include unintentional dictation errors       Sharyn Creamer, MD 02/04/21 1829

## 2021-02-08 ENCOUNTER — Telehealth: Payer: Self-pay | Admitting: Student

## 2021-02-08 NOTE — Telephone Encounter (Signed)
Spoke with patient's daughter-in-law Patrcia Schnepp, regarding the Palliative referral/services and all questions were answered and she was in agreement with beginning services with [place].  I have scheduled an In-home Consult for 02/20/21 @ 9 AM.

## 2021-02-20 ENCOUNTER — Other Ambulatory Visit: Payer: Medicare Other | Admitting: Student

## 2021-02-20 ENCOUNTER — Other Ambulatory Visit: Payer: Self-pay

## 2021-02-20 DIAGNOSIS — Z515 Encounter for palliative care: Secondary | ICD-10-CM

## 2021-02-20 DIAGNOSIS — R110 Nausea: Secondary | ICD-10-CM

## 2021-02-20 DIAGNOSIS — K5900 Constipation, unspecified: Secondary | ICD-10-CM

## 2021-02-20 DIAGNOSIS — I10 Essential (primary) hypertension: Secondary | ICD-10-CM

## 2021-02-20 DIAGNOSIS — I25118 Atherosclerotic heart disease of native coronary artery with other forms of angina pectoris: Secondary | ICD-10-CM

## 2021-02-20 NOTE — Progress Notes (Signed)
Kristen Dudley Consult Note Telephone: [phone]  Fax: [phone]   Date of encounter: 02/20/21 9:18 AM PATIENT NAME: Kristen Dudley [address] [place] [address]   [phone] (home)  DOB: [birth year] MRN: [id number] PRIMARY CARE PROVIDER:    Lucky Cowboy, FNP  REFERRING PROVIDER:   Beverley Fiedler, [place],  [place] [address] [phone]  RESPONSIBLE PARTY:    Contact Information     Name Relation Home Work Mobile   Glanz,david Son   [phone]   Skyler, Dusing (daught-in-law) Other   [phone]        I met face to face with patient and family in the home Palliative Care was asked to follow this patient by consultation request of  Beverley Fiedler, * to address advance care planning and complex medical decision making. This is the initial visit.                                     ASSESSMENT AND PLAN / RECOMMENDATIONS:   Advance Care Planning/Goals of Care: Goals include to maximize quality of life and symptom management. Patient/health care surrogate gave his/her permission to discuss.Our advance care planning conversation included a discussion about:    The value and importance of advance care planning  Experiences with loved ones who have been seriously ill or have died  Exploration of personal, cultural or spiritual beliefs that might influence medical decisions  Exploration of goals of care in the event of a sudden injury or illness  Identification and preparation of a healthcare agent son Shanon Brow, and daughter in law Dreama. Decision not to resuscitate or to de-escalate disease focused treatments due to poor prognosis. CODE STATUS: DNR  Education provided on palliative medicine versus hospice services.  Palliative medicine will continue to provide ongoing support to patient in the home setting. Family would like for patient to be able to remain in the home; request recommendations  on caregiver as needed.  Symptom Management/Plan:  CAD-no recent episodes of chest pain. Continue aspirin and Plavix as directed. Nitroglycerin PRN chest pain.   Hypertension-blood pressure managed with amlodipine; continue amlodipine 15m daily.   Nausea-continue ondansetron 4 mg every 8 hours PRN; new script sent.  Constipation-recommend increasing fiber to diet, dulcolax 547mQD as needed.   Family is requesting social work assistance with caregivers in the home as needed. Will refer to palliative SW.    Follow up Palliative Care Visit: Palliative care will continue to follow for complex medical decision making, advance care planning, and clarification of goals. Return in 6 weeks or prn.  I spent 60 minutes providing this consultation. More than 50% of the time in this consultation was spent in counseling and care coordination.  This visit was coded based on medical decision making (MDM).  PPS: 50%  HOSPICE ELIGIBILITY/DIAGNOSIS: TBD  Chief Complaint: Palliative Medicine initial visit.   HISTORY OF PRESENT ILLNESS:  Kristen Dudley a [age].o. year old female  with hypertension, hyperlipidemia, TIAs, PVD, GERD, stenosis of right carotid artery, AAA, CKD 3, hx of femur fracture.  ED visit on 02/04/21 due to rectal bleeding, hemorrhoids.   Patient resides at home with son and daughter-in-law. Patient states that she is doing okay. She states she is able to complete most ADLs. She uses Rollator walker for ambulation. She denies any recent falls or injury. Patient endorses good appetite,  daughter-in-law endorses patient mostly eats junk foods.  She does endorse having occasional nausea, constipation. Denies pain, chest pain; keeps her nitroglycerin with her just in case. She states she is sleeping well at night. No recent infections. Patient had COVID last year.  She states she would not take any vaccinations including flu shot. No recent infections. A 10-point review of systems is negative,  except for the pertinent positives and negatives detailed in the HPI.    History obtained from review of EMR, discussion with primary team, and interview with family, facility staff/caregiver and/or Ms. [place].  I reviewed available labs, medications, imaging, studies and related documents from the EMR.  Records reviewed and summarized above.     Physical Exam: Pulse 84, resp 16, b/p 118/60, sats 97% on room air Constitutional: NAD General: frail appearing  EYES: anicteric sclera, lids intact, no discharge  ENMT: intact hearing, oral mucous membranes moist, dentition intact CV: S1S2, RRR, no LE edema Pulmonary: LCTA, no increased work of breathing, no cough, room air Abdomen: normo-active BS + 4 quadrants, soft and non tender GU: deferred MSK: moves all extremities, ambulatory Skin: warm and dry, no rashes or wounds on visible skin Neuro:  no generalized weakness, A & O x 3 Psych: non-anxious affect, pleasant Hem/lymph/immuno: no widespread bruising CURRENT PROBLEM LIST:  Patient Active Problem List   Diagnosis Date Noted   Carotid artery stenosis, symptomatic 11/17/2020   Chest pain    Acute cerebrovascular insufficiency transient focal neurologic deficit 11/16/2020   Hyperlipidemia 09/16/2019   Abdominal aortic aneurysm (AAA) without rupture 09/16/2019   Senile purpura ([place]) 04/29/2018   Macular degeneration of both eyes 04/29/2018   Mild acid reflux 04/29/2018   Obesity (BMI 30.0-34.9) 04/29/2018   Stage 3 chronic kidney disease ([place]) 04/29/2018   PVD (peripheral vascular disease) ([place]) 03/17/2018   History of femur fracture 03/17/2018   PAST MEDICAL HISTORY:  Active Ambulatory Problems    Diagnosis Date Noted   PVD (peripheral vascular disease) ([place]) 03/17/2018   History of femur fracture 03/17/2018   Senile purpura ([place]) 04/29/2018   Macular degeneration of both eyes 04/29/2018   Mild acid reflux 04/29/2018   Obesity (BMI 30.0-34.9) 04/29/2018   Stage 3 chronic  kidney disease ([place]) 04/29/2018   Hyperlipidemia 09/16/2019   Abdominal aortic aneurysm (AAA) without rupture 09/16/2019   Acute cerebrovascular insufficiency transient focal neurologic deficit 11/16/2020   Carotid artery stenosis, symptomatic 11/17/2020   Chest pain    Resolved Ambulatory Problems    Diagnosis Date Noted   Sepsis ([place]) 02/25/2018   Past Medical History:  Diagnosis Date   Hearing loss    SOCIAL HX:  Social History   Tobacco Use   Smoking status: Former    Packs/day: 0.50    Years: 50.00    Pack years: 25.00    Types: Cigarettes    Quit date: 05/01/2001    Years since quitting: 19.8   Smokeless tobacco: Never  Substance Use Topics   Alcohol use: Never   FAMILY HX:  Family History  Problem Relation Age of Onset   Breast cancer Mother    Heart disease Father       ALLERGIES:  Allergies  Allergen Reactions   Codeine Other (See Comments)    agitation   Iohexol     "makes me pass out"  *Pt got IV contrast during a code stroke on 11/14/20 without any pre-meds and did just fine.     PERTINENT MEDICATIONS:  Outpatient Encounter Medications as  of 02/20/2021  Medication Sig   acetaminophen (TYLENOL) 500 MG tablet Take 1,000 mg by mouth every 6 (six) hours as needed for mild pain.   albuterol (VENTOLIN HFA) 108 (90 Base) MCG/ACT inhaler Inhale 2 puffs into the lungs every 6 (six) hours as needed for wheezing or shortness of breath.   amLODipine (NORVASC) 5 MG tablet Take 1 tablet (5 mg total) by mouth daily.   aspirin EC 81 MG tablet Take 81 mg by mouth daily. Swallow whole.   atorvastatin (LIPITOR) 20 MG tablet Take 20 mg by mouth at bedtime.   clopidogrel (PLAVIX) 75 MG tablet TAKE 1 TABLET BY MOUTH EVERY DAY (Patient taking differently: Take 75 mg by mouth daily.)   magnesium oxide (MAG-OX) 400 (240 Mg) MG tablet Take 400 mg by mouth daily.   nitroGLYCERIN (NITROSTAT) 0.4 MG SL tablet Place 1 tablet (0.4 mg total) under the tongue as needed for chest  pain.   Omega-3 Fatty Acids (FISH OIL) 1000 MG CAPS Take 1,000 mg by mouth daily.   ondansetron (ZOFRAN) 4 MG tablet Take 1 tablet (4 mg total) by mouth daily as needed for nausea or vomiting.   pantoprazole (PROTONIX) 40 MG tablet TAKE 1 TABLET BY MOUTH EVERY DAY (Patient taking differently: Take 40 mg by mouth daily.)   No facility-administered encounter medications on file as of 02/20/2021.   Thank you for the opportunity to participate in the care of Ms. Wernick.  The palliative care team will continue to follow. Please call our office at [phone] if we can be of additional assistance.   Ezekiel Slocumb, NP ,   COVID-19 PATIENT SCREENING TOOL Asked and negative response unless otherwise noted:  Have you had symptoms of covid, tested positive or been in contact with someone with symptoms/positive test in the past 5-10 days? No

## 2021-03-01 ENCOUNTER — Telehealth: Payer: Self-pay

## 2021-03-01 NOTE — Telephone Encounter (Signed)
03/01/21 @1 :40 PM: PC SW outreached patients DIL, Dreama, per PC NP- L. Rivers, to assess needs and discuss caregiver resources.  DIL shared that patient resides with her and patients son. They assist patient as needed and are not in need of daily caregivers, but rather someone to be with patient should her son and DIL need to go out of town.   SW and DIL discussed pay options for caregivers being private pay, Medicaid, long term care policies or VA benefits. DIL is unaware if patient has the aforementioned benefits but will inquire with patient.   SW sent DIL an e-mail with the following information on  a few home care agencies:  [facility]  [address], [place]  Phone: [phone]  Offers 24/7 and respite care needs  [email or web address]    [facility]  [address]  [place], [place]  Office: [phone]  Fax: [phone]  [phone]   **[facility] program grant; level 1-3 up to 6 hours** waiting list up to 2 yrs. 81yrs+ and live in [place]**   [facility]  [address]   [address]  [place], [place]  Office: [phone]  Fax: [phone]  [email or web address]

## 2021-03-28 ENCOUNTER — Other Ambulatory Visit: Payer: Medicare Other | Admitting: Student

## 2021-03-28 ENCOUNTER — Other Ambulatory Visit: Payer: Self-pay

## 2021-03-28 DIAGNOSIS — Z515 Encounter for palliative care: Secondary | ICD-10-CM

## 2021-03-28 DIAGNOSIS — N390 Urinary tract infection, site not specified: Secondary | ICD-10-CM

## 2021-03-28 NOTE — Progress Notes (Signed)
° ° °  Therapist, nutritional Palliative Care Consult Note Telephone: [phone]  Fax: [phone]    Date of encounter: 03/28/21  PATIENT NAME: Kristen Dudley [address] [place] [address]   [phone] (home)  DOB: [birth year] MRN: [id number] PRIMARY CARE PROVIDER:    Newton Pigg, FNP  REFERRING PROVIDER:   Felix Pacini, FNP [address] [place],  [place] [address] [phone]  RESPONSIBLE PARTY:    Contact Information     Name Relation Home Work Mobile   Knoff,david Son   [phone]   Tiegan, [place]or (daught-in-law) Other   [phone]       Due to the COVID-19 crisis, this home visit was done via telephone due to the patient's inability to connect via an audiovisual connection or their refusal to have an in-person visit. This connection was agreed to by the patient. Verified that I am speaking with the correct person using two identifiers.                                    ASSESSMENT AND PLAN / RECOMMENDATIONS:   Advance Care Planning/Goals of Care: Goals include to maximize quality of life and symptom management.  CODE STATUS: DNR  Symptom Management/Plan:  Urinary tract infection-patient with UTI symptoms x 1 week. Will treat with Macrobid 100 mg BID x 7 days; script sent to pharmacy. Recommend adequate fluid intake and continue drinking cranberry juice.    Follow up Palliative Care Visit: Palliative care will continue to follow for complex medical decision making, advance care planning, and clarification of goals. Return in 4 weeks or prn.   This visit was coded based on medical decision making (MDM).  PPS: 50%  HOSPICE ELIGIBILITY/DIAGNOSIS: TBD  Chief Complaint: Palliative Medicine follow up visit.   HISTORY OF PRESENT ILLNESS:  Kristen Dudley is a 87 y.o. year old female  with hypertension, hyperlipidemia, TIAs, PVD, GERD, stenosis of right carotid artery, AAA, CKD 3, hx of femur fracture.   Patient reports having  dysuria, burning, urinary frequency and urgency x one week. She denies fever but does report having some chills. Denies hematuria. She has started drinking cranberry juice. She is leaving to go out of town today. A 10-point review of systems is negative, except for the pertinent positives and negatives detailed in the HPI.    History obtained from review of EMR, discussion with primary team, and interview with family, facility staff/caregiver and/or [name].  I reviewed available labs, medications, imaging, studies and related documents from the EMR.  Records reviewed and summarized above.     Physical Exam: PE deferred due to this being a Tele health visit.    Thank you for the opportunity to participate in the care of [name].  The palliative care team will continue to follow. Please call our office at [phone] if we can be of additional assistance.   Luella Cook, NP   COVID-19 PATIENT SCREENING TOOL Asked and negative response unless otherwise noted:   Have you had symptoms of covid, tested positive or been in contact with someone with symptoms/positive test in the past 5-10 days? No

## 2021-04-10 ENCOUNTER — Other Ambulatory Visit: Payer: Medicare Other | Admitting: Student

## 2021-04-10 ENCOUNTER — Other Ambulatory Visit: Payer: Self-pay

## 2021-04-10 DIAGNOSIS — I25118 Atherosclerotic heart disease of native coronary artery with other forms of angina pectoris: Secondary | ICD-10-CM

## 2021-04-10 DIAGNOSIS — J0100 Acute maxillary sinusitis, unspecified: Secondary | ICD-10-CM

## 2021-04-10 DIAGNOSIS — K625 Hemorrhage of anus and rectum: Secondary | ICD-10-CM

## 2021-04-10 DIAGNOSIS — K5900 Constipation, unspecified: Secondary | ICD-10-CM

## 2021-04-10 DIAGNOSIS — Z515 Encounter for palliative care: Secondary | ICD-10-CM

## 2021-04-10 NOTE — Progress Notes (Signed)
[facility] Consult Note Telephone: [phone]  Fax: [phone]    Date of encounter: 04/10/21 2:03 PM PATIENT NAME: Kristen Dudley [address] [place] [address]   [phone] (home)  DOB: Oct 21, 1927 MRN: [id number] PRIMARY CARE PROVIDER:    Beverley Fiedler, FNP  REFERRING PROVIDER:   Beverley Fiedler, [place],  [place] [address] [phone]   RESPONSIBLE PARTY:    Contact Information     Name Relation Home Work Mobile   Berkley,david Son   Toyah (daught-in-law) Other   [phone]        I met face to face with patient and family in the home. Palliative Care was asked to follow this patient by consultation request of  Irwin Brakeman Higinio Roger, FNP  to address advance care planning and complex medical decision making. This is a follow up visit.                                   ASSESSMENT AND PLAN / RECOMMENDATIONS:   Advance Care Planning/Goals of Care: Goals include to maximize quality of life and symptom management.  CODE STATUS: DNR  Symptom Management/Plan:  CAD-Patient denies any recent episodes of chest pain; she does endorse occasional shortness of breath with exertion. Continue aspirin and Plavix as directed.  Nitroglycerin as needed chest pain.   Constipation-patient reports straining with bowel movements. Start Colace 100 mg daily.  Rectal bleeding-secondary to hemorrhoids. Last hemoglobin on 02/04/21 was 16.0, HCT 49.0. Patient reports small amount of rectal bleeding in the past 2 weeks. She does report straining with bowel movements. Start Preparation H cream twice daily as needed, will start stool softener for constipation.  Sinusitis-symptoms consistent with sinusitis.  Will start amoxicillin clavulanate 500/125 mg twice daily x 5 days, saline spray 1 spray to each nostril twice daily as needed.   Follow up Palliative Care Visit: Palliative care will  continue to follow for complex medical decision making, advance care planning, and clarification of goals. Return in 8 weeks or prn.   This visit was coded based on medical decision making (MDM).  PPS: 50%  HOSPICE ELIGIBILITY/DIAGNOSIS: TBD  Chief Complaint: Palliative Medicine follow up visit.   HISTORY OF PRESENT ILLNESS:  Kristen Dudley is a 88 y.o. year old female  with  CAD, hypertension, hyperlipidemia, TIAs, PVD, GERD, stenosis of right carotid artery, AAA, CKD 3, hx of femur fracture.  ED visit on 02/04/21 due to rectal bleeding, hemorrhoids.   Patient reports sinus pain, increased yellow and green drainage x 1 week. Also reports ear pain, sinus pain/tenderness. Denies sore throat. Reports occasional shortness of breath with exertion. She reports having small amount of bleeding in past two weeks with bowel movements. She reports straining with bowel movements. She was treated for a UTI in the past month; symptoms have improved. Good appetite. Sleeping well. She is able to complete most adl's. No falls; occasionally using rollator for ambulation. No falls. A 10-point review of systems is negative, except for the pertinent positives and negatives detailed in the HPI.   History obtained from review of EMR, discussion with primary team, and interview with family, facility staff/caregiver and/or Ms. Quale.  I reviewed available labs, medications, imaging, studies and related documents from the EMR.  Records reviewed and summarized above.   Physical Exam: Pulse 80, resp 16, b/p 120/80, sats 95% on  room air.  Constitutional: NAD General: frail appearing EYES: anicteric sclera, lids intact, no discharge  ENMT: slight hard of hearing, posterior oropharynx with erythema, oral mucous membranes moist, dentition intact CV: S1S2, RRR, no LE edema Pulmonary: LCTA, no increased work of breathing, no cough, room air Abdomen: normo-active BS + 4 quadrants, soft and non tender, no ascites GU:  deferred MSK: moves all extremities, ambulatory Skin: warm and dry, no rashes or wounds on visible skin Neuro:  no generalized weakness, A & O x 3 Psych: non-anxious affect, pleasant Hem/lymph/immuno: no widespread bruising   Thank you for the opportunity to participate in the care of Kristen Dudley.  The palliative care team will continue to follow. Please call our office at [phone] if we can be of additional assistance.   Ezekiel Slocumb, NP   COVID-19 PATIENT SCREENING TOOL Asked and negative response unless otherwise noted:   Have you had symptoms of covid, tested positive or been in contact with someone with symptoms/positive test in the past 5-10 days? No

## 2021-05-24 ENCOUNTER — Other Ambulatory Visit: Payer: Medicare Other | Admitting: Student

## 2021-05-24 ENCOUNTER — Other Ambulatory Visit: Payer: Self-pay

## 2021-05-24 DIAGNOSIS — K5900 Constipation, unspecified: Secondary | ICD-10-CM

## 2021-05-24 DIAGNOSIS — N3000 Acute cystitis without hematuria: Secondary | ICD-10-CM

## 2021-05-24 DIAGNOSIS — Z515 Encounter for palliative care: Secondary | ICD-10-CM

## 2021-05-24 NOTE — Progress Notes (Signed)
[facility] Consult Note Telephone: [phone]  Fax: [phone]    Date of encounter: 05/24/21 11:52 AM PATIENT NAME: Kristen Dudley [address] [place] [address]   [phone] (home)  DOB: 11/07/1927 MRN: [id number] PRIMARY CARE PROVIDER:    Pcp, No,  No address on file None  REFERRING PROVIDER:   Beverley Dudley, [place],  [place] [address] [phone]  RESPONSIBLE PARTY:    Contact Information     Name Relation Home Work Mobile   Dudley,Kristen Son   Isola (daught-in-law) Other   [phone]        Due to the COVID-19 crisis, this visit was done via telemedicine from my office and it was initiated and consent by this patient and or family.  This home visit was completed via telephone due to the patient's inability to connect via an audiovisual connection or their refusal to have an in-person visit. This connection was agreed to by the patient. I verified that I am speaking with the correct person using two identifiers. I discussed the limitations of evaluation and management by telemedicine. The patient expressed understanding and agreed to proceed.                                    ASSESSMENT AND PLAN / RECOMMENDATIONS:   Advance Care Planning/Goals of Care: Goals include to maximize quality of life and symptom management.   CODE STATUS: DNR  Symptom Management/Plan:  UTI-patient with symptoms of UTI times past 3 days; worse today.  Will treat empirically and test for cure after completing antibiotics. This is the second urinary tract infection patient has had in the past 2 months.  Encourage adequate fluids, cranberry juice.  She is unable to take cranberry tablets, she states they cause rash.   Constipation-patient reports straining recently she reports small amount of bleeding from hemorrhoids.  She is encouraged to take Colace daily, use Preparation H as  needed.  Follow up Palliative Care Visit: Palliative care will continue to follow for complex medical decision making, advance care planning, and clarification of goals. Return in 4 weeks or prn.  This visit was coded based on medical decision making (MDM).  PPS: 50%  HOSPICE ELIGIBILITY/DIAGNOSIS: TBD  Chief Complaint: Urinary frequency; palliative medicine follow up visit.   HISTORY OF PRESENT ILLNESS:  Kristen Dudley is a 88 y.o. year old female  with  hypertension, hyperlipidemia, TIAs, PVD, GERD, stenosis of right carotid artery, AAA, CKD 3, hx of femur fracture.   Patient endorses not feeling well today.  She endorses having urinary tract symptoms times the past 3 days.  Stating she feels worse today; denies fever or chills.  She endorses burning with urination, frequency, urgency and dysuria.  Denies hematuria; no nausea or lower back pain. She has not been drinking cranberry juice, but not able to take cranberry tablet, which causes rash.  Does report occasional small amount bleeding from her hemorrhoids. Reports straining with bowel movements. Fair appetite; no weight loss reported. A 10-point review of systems is negative, except for the pertinent positives and negatives detailed in the HPI.   History obtained from review of EMR, discussion with primary team, and interview with family, facility staff/caregiver and/or Kristen Dudley.  I reviewed available labs, medications, imaging, studies and related documents from the EMR.  Records reviewed and summarized above.  Physical Exam:  PE deferred due to this being a telemedicine visit.  Thank you for the opportunity to participate in the care of Kristen Dudley.  The palliative care team will continue to follow. Please call our office at [phone] if we can be of additional assistance.   Ezekiel Slocumb, NP   COVID-19 PATIENT SCREENING TOOL Asked and negative response unless otherwise noted:   Have you had symptoms of covid, tested  positive or been in contact with someone with symptoms/positive test in the past 5-10 days? no

## 2021-06-11 ENCOUNTER — Other Ambulatory Visit: Payer: Self-pay

## 2021-06-11 ENCOUNTER — Other Ambulatory Visit: Payer: Medicare Other | Admitting: Student

## 2021-06-11 DIAGNOSIS — Z515 Encounter for palliative care: Secondary | ICD-10-CM

## 2021-06-11 DIAGNOSIS — K5900 Constipation, unspecified: Secondary | ICD-10-CM

## 2021-06-11 DIAGNOSIS — N390 Urinary tract infection, site not specified: Secondary | ICD-10-CM

## 2021-06-11 DIAGNOSIS — I10 Essential (primary) hypertension: Secondary | ICD-10-CM

## 2021-06-11 DIAGNOSIS — I25118 Atherosclerotic heart disease of native coronary artery with other forms of angina pectoris: Secondary | ICD-10-CM

## 2021-06-11 NOTE — Progress Notes (Signed)
? ? ?[facility] ?[facility] Consult Note ?Telephone: [phone]  ?Fax: [phone]  ? ? ?Date of encounter: 06/11/21 ?2:11 PM ?PATIENT NAME: Kristen Dudley ?[address] ?[place] [address]   ?[phone] (home)  ?DOB: [birth year] ?MRN: [id number] ?PRIMARY CARE PROVIDER:    ?Pcp, No,  ?No address on file ?None ? ?REFERRING PROVIDER:   ?Lucky Cowboy, NP ? ? ?RESPONSIBLE PARTY:    ?Contact Information   ? ? Name Relation Home Work Mobile  ? Wuellner,david Son   [phone]  ? Tessier,Dreama (daught-in-law) Other   [phone]  ? ?  ? ? ? ?I met face to face with patient and family in the home. Palliative Care was asked to follow this patient by consultation request of  Lucky Cowboy, NP to address advance care planning and complex medical decision making. This is a follow up visit. ? ?                                 ASSESSMENT AND PLAN / RECOMMENDATIONS:  ? ?Advance Care Planning/Goals of Care: Goals include to maximize quality of life and symptom management. Patient/health care surrogate gave his/her permission to discuss. ?Our advance care planning conversation included a discussion about:    ?The value and importance of advance care planning  ?Experiences with loved ones who have been seriously ill or have died  ?Exploration of personal, cultural or spiritual beliefs that might influence medical decisions  ?Exploration of goals of care in the event of a sudden injury or illness ?CODE STATUS: DNR ? ?Symptom Management/Plan: ? ?CAD-no recent episodes of chest pain, endorses occasional shortness of breath with exertion. Continue aspirin and Plavix as directed. Nitroglycerin PRN chest pain.  ?  ?Hypertension-blood pressure managed with amlodipine; continue amlodipine 28m daily. ?  ?Constipation-moving bowels okay; has dulcolax PRN. Encourage diet with fiber, adequate fluids. No recent hemorrhoids; has preparation H PRN.  ? ?Recurrent UTI's-patient without any urinary symptoms  today. She is encouraged to continue drinking adequate fluids, cranberry juice.  ? ?Follow up Palliative Care Visit: Palliative care will continue to follow for complex medical decision making, advance care planning, and clarification of goals. Return in 8 weeks or prn. ? ? ?This visit was coded based on medical decision making (MDM). ? ?PPS: 50% ? ?HOSPICE ELIGIBILITY/DIAGNOSIS: TBD ? ?Chief Complaint: Palliative Medicine follow up visit.  ? ?HISTORY OF PRESENT ILLNESS:  Kristen HOCHSTEINis a 88y.o. year old female  with  hypertension, hyperlipidemia, TIAs, PVD, GERD, stenosis of right carotid artery, AAA, CKD 3, hx of femur fracture.  ED visit on 02/04/21 due to rectal bleeding, hemorrhoids.    ? ?Denies pain. Shortness of breath occasionally with exertion,  walking. Moving bowels okay, no hemorrhoids. Voiding well; denies any urinary complaints. Appetite is good. Sleeping well.  A 10-point review of systems is negative, except for the pertinent positives and negatives detailed in the HPI.  ? ?History obtained from review of EMR, discussion with primary team, and interview with family, facility staff/caregiver and/or Ms. Palmisano.  ?I reviewed available labs, medications, imaging, studies and related documents from the EMR.  Records reviewed and summarized above.  ? ? ?Physical Exam: ?Current and past weights: ?Pulse 86, resp, 134/66, sats 97% on room  ?Constitutional: NAD ?General: frail appearing, WNWD ?EYES: anicteric sclera, lids intact, no discharge  ?ENMT: hard of hearing, oral mucous membranes moist, dentition intact ?CV: S1S2, RRR, no  LE edema ?Pulmonary: LCTA, no increased work of breathing, no cough, room air ?Abdomen: normo-active BS + 4 quadrants, soft and non tender, no ascites ?GU: deferred ?MSK: moves all extremities, ambulatory ?Skin: warm and dry, no rashes or wounds on visible skin ?Neuro:  no generalized weakness,  no cognitive impairment ?Psych: non-anxious affect, A and O x 3 ?Hem/lymph/immuno: no  widespread bruising ? ? ?Thank you for the opportunity to participate in the care of Ms. Stampley.  The palliative care team will continue to follow. Please call our office at [phone] if we can be of additional assistance.  ? ?Ezekiel Slocumb, NP  ? ?COVID-19 PATIENT SCREENING TOOL ?Asked and negative response unless otherwise noted:  ? ?Have you had symptoms of covid, tested positive or been in contact with someone with symptoms/positive test in the past 5-10 days? No ? ?

## 2021-06-19 ENCOUNTER — Telehealth: Payer: Self-pay

## 2021-06-19 NOTE — Telephone Encounter (Signed)
Visit scheduled for 06/20/21 @ 10 am for lab work. ?

## 2021-06-20 ENCOUNTER — Other Ambulatory Visit: Payer: Medicare Other

## 2021-06-20 ENCOUNTER — Other Ambulatory Visit: Payer: Self-pay

## 2021-06-20 DIAGNOSIS — Z515 Encounter for palliative care: Secondary | ICD-10-CM

## 2021-06-20 NOTE — Progress Notes (Signed)
PATIENT NAME: Kristen Dudley ?DOB: [birth year] ?MRN: [id number] ? ?PRIMARY CARE PROVIDER: Pcp, No ? ?RESPONSIBLE PARTY:  ?Acct ID - Guarantor Home Phone Work Phone Relationship Acct Type  ?[id number] [name], [name]* [phone]  Self P/F  ?   [address] [place], [place] [address]  ? ? ?RN visit made today at request of Palliative NP to draw blood for ordered labs. Patient met this RN at the door and directed to kitchen area. Explained purpose of visit to patient and obtained consent for venipuncture and lab draw. Obtained blood via butterfly from right antecubital space. Attempted to fill 3 vials, although, unable to completely fill due to vein blowing. Patient requested to drop sample off at lab and wait and see if there is enough to run ordered tests. Blood work dropped off at [facility].  ? ? ? ? ?Cornelius Moras, RN ? ?

## 2021-06-24 ENCOUNTER — Other Ambulatory Visit: Payer: Self-pay

## 2021-06-24 ENCOUNTER — Emergency Department: Payer: Medicare Other

## 2021-06-24 ENCOUNTER — Emergency Department
Admission: EM | Admit: 2021-06-24 | Discharge: 2021-06-24 | Disposition: A | Discharge status: Home / Self Care | Payer: Medicare Other | Attending: Emergency Medicine | Admitting: Emergency Medicine

## 2021-06-24 DIAGNOSIS — Z7902 Long term (current) use of antithrombotics/antiplatelets: Secondary | ICD-10-CM | POA: Insufficient documentation

## 2021-06-24 DIAGNOSIS — R079 Chest pain, unspecified: Secondary | ICD-10-CM

## 2021-06-24 DIAGNOSIS — R519 Headache, unspecified: Secondary | ICD-10-CM | POA: Insufficient documentation

## 2021-06-24 DIAGNOSIS — I447 Left bundle-branch block, unspecified: Secondary | ICD-10-CM

## 2021-06-24 DIAGNOSIS — R531 Weakness: Secondary | ICD-10-CM | POA: Insufficient documentation

## 2021-06-24 DIAGNOSIS — R29898 Other symptoms and signs involving the musculoskeletal system: Secondary | ICD-10-CM

## 2021-06-24 DIAGNOSIS — R0602 Shortness of breath: Secondary | ICD-10-CM | POA: Diagnosis not present

## 2021-06-24 LAB — COMPREHENSIVE METABOLIC PANEL
ALT: 16 U/L (ref 0–44)
AST: 23 U/L (ref 15–41)
Albumin: 4 g/dL (ref 3.5–5.0)
Alkaline Phosphatase: 87 U/L (ref 38–126)
Anion gap: 10 (ref 5–15)
BUN: 22 mg/dL (ref 8–23)
CO2: 22 mmol/L (ref 22–32)
Calcium: 9.3 mg/dL (ref 8.9–10.3)
Chloride: 105 mmol/L (ref 98–111)
Creatinine, Ser: 1.08 mg/dL — ABNORMAL HIGH (ref 0.44–1.00)
GFR, Estimated: 48 mL/min — ABNORMAL LOW (ref >60)
Glucose, Bld: 132 mg/dL — ABNORMAL HIGH (ref 70–99)
Potassium: 3.8 mmol/L (ref 3.5–5.1)
Sodium: 137 mmol/L (ref 135–145)
Total Bilirubin: 1.1 mg/dL (ref 0.3–1.2)
Total Protein: 7.3 g/dL (ref 6.5–8.1)

## 2021-06-24 LAB — CBC WITH DIFFERENTIAL/PLATELET
Abs Immature Granulocytes: 0.02 10*3/uL (ref 0.00–0.07)
Basophils Absolute: 0.1 10*3/uL (ref 0.0–0.1)
Basophils Relative: 1 %
Eosinophils Absolute: 0.1 10*3/uL (ref 0.0–0.5)
Eosinophils Relative: 1 %
HCT: 48.7 % — ABNORMAL HIGH (ref 36.0–46.0)
Hemoglobin: 15.6 g/dL — ABNORMAL HIGH (ref 12.0–15.0)
Immature Granulocytes: 0 %
Lymphocytes Relative: 29 %
Lymphs Abs: 2.7 10*3/uL (ref 0.7–4.0)
MCH: 27.2 pg (ref 26.0–34.0)
MCHC: 32 g/dL (ref 30.0–36.0)
MCV: 84.8 fL (ref 80.0–100.0)
Monocytes Absolute: 0.8 10*3/uL (ref 0.1–1.0)
Monocytes Relative: 9 %
Neutro Abs: 5.4 10*3/uL (ref 1.7–7.7)
Neutrophils Relative %: 60 %
Platelets: 542 10*3/uL — ABNORMAL HIGH (ref 150–400)
RBC: 5.74 MIL/uL — ABNORMAL HIGH (ref 3.87–5.11)
RDW: 14.2 % (ref 11.5–15.5)
WBC: 9.1 10*3/uL (ref 4.0–10.5)
nRBC: 0 % (ref 0.0–0.2)

## 2021-06-24 LAB — TROPONIN I (HIGH SENSITIVITY)
Troponin I (High Sensitivity): 7 ng/L (ref <18)
Troponin I (High Sensitivity): 9 ng/L (ref <18)

## 2021-06-24 LAB — BRAIN NATRIURETIC PEPTIDE: B Natriuretic Peptide: 48.5 pg/mL (ref 0.0–100.0)

## 2021-06-24 MED ORDER — ISOSORBIDE MONONITRATE ER 30 MG PO TB24: 30.0000 mg | ORAL_TABLET | Freq: Every day | ORAL | 0 refills | Status: AC

## 2021-06-24 MED ORDER — IOHEXOL 350 MG/ML SOLN
75.0000 mL | Freq: Once | INTRAVENOUS | Status: AC | PRN
  Administered 2021-06-24: 75 mL via INTRAVENOUS

## 2021-06-24 MED ORDER — METOPROLOL TARTRATE 25 MG PO TABS: 25.0000 mg | ORAL_TABLET | Freq: Two times a day (BID) | ORAL | 0 refills | Status: AC

## 2021-06-24 MED ORDER — METOPROLOL TARTRATE 25 MG PO TABS
25.0000 mg | ORAL_TABLET | Freq: Once | ORAL | Status: AC
  Administered 2021-06-24: 25 mg via ORAL
  Filled 2021-06-24: qty 1

## 2021-06-24 MED ORDER — ISOSORBIDE MONONITRATE ER 60 MG PO TB24
30.0000 mg | ORAL_TABLET | Freq: Every day | ORAL | Status: DC
  Administered 2021-06-24: 30 mg via ORAL
  Filled 2021-06-24: qty 1

## 2021-06-24 MED ORDER — ACETAMINOPHEN 500 MG PO TABS
1000.0000 mg | ORAL_TABLET | Freq: Once | ORAL | Status: AC
Start: 2021-06-24 — End: 2021-06-24
  Administered 2021-06-24: 1000 mg via ORAL
  Filled 2021-06-24: qty 2

## 2021-06-24 NOTE — ED Notes (Signed)
Pt NAD, a/ox4. Pt verbalizes understanding of all DC and f/u instructions. All questions answered. Pt walks with steady gait to lobby at DC.  ? ?

## 2021-06-24 NOTE — ED Triage Notes (Signed)
Patient brought in via ems from home. Patient c/o chest pain that radiated to bilat ears since 0300. Patient states she has no pain at this time. Also c/o headache and generalized weakness ?

## 2021-06-24 NOTE — ED Provider Notes (Signed)
? ?[facility] ?Provider Note ? ? ? Event Date/Time  ? First MD Initiated Contact with Patient 06/24/21 [phone]   ?  (approximate) ? ? ?History  ? ?Chest Pain ? ? ?HPI ? ?Kristen Dudley is a 88 y.o. female with history of carotid stenosis on aspirin and Plavix, abdominal aortic aneurysm who comes in with concerns for chest pain.  Patient reports that she had chest pain at 3 AM that woke her up from sleep.  She states that the pain was a dull sensation in her chest that goes into her abdomen.  Maybe some mild SOB. States that she called EMS and got 324 of aspirin.  States that she does not have any additional pain at this time but does report some generalized weakness and a mild headache which she reports is new for her.  She states that her speech is at her normal self.  When I examined her she is able to lift her left leg up off the bed but reports some difficulty with the right leg.  She is unclear when this started.  States that she did not notice until I asked her to lift her leg.  Patient stated that she went to bed at 10:30 PM on 3/25 and felt at her normal state at that time. ? ? ? ?Triage Vital Signs: ?ED Triage Vitals  ?Enc Vitals Group  ?   BP 06/24/21 0723 (!) 158/72  ?   Pulse Rate 06/24/21 0723 64  ?   Resp 06/24/21 0723 20  ?   Temp 06/24/21 0723 98.6 ?F (37 ?C)  ?   Temp Source 06/24/21 0723 Oral  ?   SpO2 06/24/21 0723 95 %  ?   Weight --   ?   Height --   ?   Head Circumference --   ?   Peak Flow --   ?   Pain Score 06/24/21 0724 0  ?   Pain Loc --   ?   Pain Edu? --   ?   Excl. in GC? --   ? ? ?Most recent vital signs: ?Vitals:  ? 06/24/21 0723  ?BP: (!) 158/72  ?Pulse: 64  ?Resp: 20  ?Temp: 98.6 ?F (37 ?C)  ?SpO2: 95%  ? ? ? ?General: Awake, no distress. ?CV:  Good peripheral perfusion. ?Resp:  Normal effort. ?Abd:  No distention.  A little with tenderness in the upper abdomen ?Other:             Patient reports a little bit of weakness in her right leg but she still able to  hold the leg up for 5 seconds.  Sensation intact bilaterally.  Good grip strength, no pronator drift.  Sensation intact in her arms.  Cranial nerves appear intact may be a slight droop in her left side.  However I did show patient a mirror and she states that she looks at her baseline self so not sure if this is new or not. ? ? ?ED Results / Procedures / Treatments  ? ?Labs ?(all labs ordered are listed, but only abnormal results are displayed) ?Labs Reviewed  ?CBC WITH DIFFERENTIAL/PLATELET - Abnormal; Notable for the following components:  ?    Result Value  ? RBC 5.74 (*)   ? Hemoglobin 15.6 (*)   ? HCT 48.7 (*)   ? Platelets 542 (*)   ? All other components within normal limits  ?COMPREHENSIVE METABOLIC PANEL - Abnormal; Notable for the following components:  ?  Glucose, Bld 132 (*)   ? Creatinine, Ser 1.08 (*)   ? GFR, Estimated 48 (*)   ? All other components within normal limits  ?BRAIN NATRIURETIC PEPTIDE  ?TROPONIN I (HIGH SENSITIVITY)  ?TROPONIN I (HIGH SENSITIVITY)  ? ? ? ?EKG ? ?My interpretation of EKG: ? ?Normal sinus rhythm 75 without any ST elevation or T wave inversions, left bundle branch block ? ?Repeat EKG is sinus rate of 88 without ST elevation or T wave inversions except for aVL with similar left bundle branch block ? ?RADIOLOGY ?I have reviewed the ct no signs of ICH/no signs of dissection pending read. ? ? ? ?PROCEDURES: ? ?Critical Care performed: No ? ?.1-3 Lead EKG Interpretation ?Performed by: Concha Se, MD ?Authorized by: Concha Se, MD  ? ?  Interpretation: normal   ?  ECG rate:  70 ?  ECG rate assessment: normal   ?  Rhythm: sinus rhythm   ?  Ectopy: none   ?  Conduction: normal   ? ? ?MEDICATIONS ORDERED IN ED: ?Medications  ?isosorbide mononitrate (IMDUR) 24 hr tablet 30 mg (30 mg Oral Given 06/24/21 1159)  ?acetaminophen (TYLENOL) tablet 1,000 mg (1,000 mg Oral Given 06/24/21 0824)  ?iohexol (OMNIPAQUE) 350 MG/ML injection 75 mL (75 mLs Intravenous Contrast Given 06/24/21 0834)   ?metoprolol tartrate (LOPRESSOR) tablet 25 mg (25 mg Oral Given 06/24/21 1200)  ? ? ? ?IMPRESSION / MDM / ASSESSMENT AND PLAN / ED COURSE  ?I reviewed the triage vital signs and the nursing notes. ?             ?               ? ?Patient comes in with chest pain and upon examination noted a little bit of right leg weakness.  Is unclear the exact onset of this weakness.  She went to bed at 10:30 PM and reports normal then.  Patient is not the best historian.  I considered stroke code but given unclear onset time and the fact that I want to rule out a dissection first I have held off on code stroke.  At this time patient's deficits are not very significant and would not be life-changing.  I have ambulated patient and she can still ambulate so therefore the weakness is not that prominent.  Given the concern with the chest pain and patient has known aneurysm I feel like it would be prudent to rule out dissection first.  Patient agrees with this plan.  Patient's son is coming I will discuss with him if he feels like her face is any different than normal.  Will get EKG, cardiac markers to evaluate for ACS, BNP to evaluate for any heart failure.  I have updated patient's son on why have not called a stroke code and wanting to rule out a dissection and he expressed understanding and was agreeable to this plan.  Patient denies any urinary symptoms suggest UTI. ? ?Patient's EKG shows new left bundle branch block which makes me even more concerned that this could be a primary cardiac cause of weakness in the right leg. ? ?I reviewed the discharge summary from 11/17/2020 where patient had chest pain that had slightly elevated troponins that they thought were due to her hypertension.  She had an episode of aphasia and left-sided weakness that she got stroke code for while in the hospital and has known carotid stenosis.  Patient is on Plavix and aspirin for this.  Patient underwent CT  angio without any issues. ? ?When I asked  patient what her allergy to contrast as she reports that she got a contrast years and years ago and she went to stand up afterwards and she passed out.  She denies any rash, vomiting, swelling in her mouth or face.  And it appears that she did fine with CT angio in September 2022 so she is okay with proceeding. ? ?8:04 AM patient on reevaluation denies any worsening weakness but she is having increasing chest discomfort.  We will get a repeat EKG.  Repeat EKG is unchanged without evidence of STEMI. ? ?I did discuss with both patient and the son and confirm that patient is DNR. ? ?8:20 AM I did discuss the case with the CT to try to get her over to the scanner faster given creatinine just came back and is in range. ? ? ?9:39 AM ?CT head negative.  I reevaluated patient and she reports feeling at her baseline self.  She is got no weakness in her leg.  Family is at bedside reports that her face appears at her baseline self. ? ?9:45 AM discussed case with neurologist Dr. Jerrell Belfast who recommended MRI and if negative patient be discharged home given unlikely to change any therapies and if positive then they can consider carotid endarterectomy if family was interested in that due to her known stenosis ? ?IMPRESSION: ?1. No evidence of acute aortic syndrome. ?2. 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 ?3. Left main and three-vessel coronary artery disease. ?4. Status post cholecystectomy. Intra and extrahepatic biliary ?ductal dilatation, as above. Given the advanced age of the patient ?and post cholecystectomy status, findings may simply reflect post ?cholecystectomy physiology. Correlation with liver function tests  is ?recommended. ?5. Colonic diverticulosis without evidence of acute diverticulitis ?at this time. ?6. Additional incidental findings, as above. ? ?Incidental findings but LFTs are normal ?  ? ?10:27 AM discussed the c

## 2021-06-24 NOTE — ED Notes (Signed)
Pt NAD in bed, a/ox4. Pt states she woke up at 0300 to 8/10 dull substernal CP that radiates to her left jaw and ears. Pt denies SOB, n/v dizziness or diaphoresis. Pt denies all pain currently. VSS ?

## 2021-06-24 NOTE — Discharge Instructions (Addendum)
I discussed the case with cardiology who recommends starting her on 2 new medications to help with her coronary disease rather than trying to do a catheterization due to the risk of this. ? ?I have talked to neurology who given the MRI is negative do not recommend any further work-up and just continue the aspirin and Plavix. ? ?Return to the ER if she develops recurrent chest pain that is different cannot getting better or any other concerns.  Please call the cardiology number and get an ER follow-up in 1 to 2 weeks ? ? ?IMPRESSION: ?1. No evidence of acute aortic syndrome. ?2. 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 ?3. Left main and three-vessel coronary artery disease. ?4. Status post cholecystectomy. Intra and extrahepatic biliary ?ductal dilatation, as above. Given the advanced age of the patient ?and post cholecystectomy status, findings may simply reflect post ?cholecystectomy physiology. Correlation with liver function tests is ?recommended. ?5. Colonic diverticulosis without evidence of acute diverticulitis ?at this time. ?6. Additional incidental findings, as above. ?  ?

## 2021-06-24 NOTE — ED Notes (Signed)
Pt to MRI

## 2021-06-28 ENCOUNTER — Encounter: Payer: Self-pay | Admitting: Podiatry

## 2021-06-28 ENCOUNTER — Ambulatory Visit: Payer: Medicare Other | Admitting: Podiatry

## 2021-06-28 DIAGNOSIS — M79675 Pain in left toe(s): Secondary | ICD-10-CM | POA: Diagnosis not present

## 2021-06-28 DIAGNOSIS — B351 Tinea unguium: Secondary | ICD-10-CM

## 2021-06-28 DIAGNOSIS — M79674 Pain in right toe(s): Secondary | ICD-10-CM | POA: Diagnosis not present

## 2021-06-28 DIAGNOSIS — I739 Peripheral vascular disease, unspecified: Secondary | ICD-10-CM

## 2021-06-28 DIAGNOSIS — D689 Coagulation defect, unspecified: Secondary | ICD-10-CM | POA: Diagnosis not present

## 2021-06-28 DIAGNOSIS — N183 Chronic kidney disease, stage 3 unspecified: Secondary | ICD-10-CM

## 2021-06-28 NOTE — Progress Notes (Signed)
This patient returns to my office for at risk foot care.  This patient requires this care by a professional since this patient will be at risk due to having chronic kidney disease, pvd, and coagulation defect.  Patient is taking plavix.  Patient  is unable to cut nails herself since the patient cannot reach her nails.These nails are painful walking and wearing shoes.  This patient presents for at risk foot care today. ? ?General Appearance  Alert, conversant and in no acute stress. ? ?Vascular  Dorsalis pedis and posterior tibial  pulses are absent  bilaterally.  Capillary return is within normal limits  bilaterally. Cold feet.  bilaterally. ? ?Neurologic  Senn-Weinstein monofilament wire test within normal limits  bilaterally. Muscle power within normal limits bilaterally. ? ?Nails Thick disfigured discolored nails with subungual debris  Hallux nails  B/L.Marland Kitchen No evidence of bacterial infection or drainage bilaterally. ? ?Orthopedic  No limitations of motion  feet .  No crepitus or effusions noted.  Hammer toes 2-4  B/L.  Plantar flexed metatarsals  2-4  B/L. ? ?Skin  normotropic skin with no porokeratosis noted bilaterally.  No signs of infections or ulcers noted.    ? ?Onychomycosis  Pain in right toes  Pain in left toes ? ?Consent was obtained for treatment procedures.   Mechanical debridement of nails 1-5  bilaterally performed with a nail nipper.  Filed with dremel without incident.  ? ? ?Return office visit   3 months                  Told patient to return for periodic foot care and evaluation due to potential at risk complications. ? ? ?Helane Gunther DPM  ?

## 2021-06-29 ENCOUNTER — Telehealth: Payer: Self-pay

## 2021-06-29 NOTE — Telephone Encounter (Signed)
10 am.  Follow up call made to son after receiving message patient was going to the ED for chest pain.  No answer.  Message left requesting a call back.  ?

## 2021-08-08 ENCOUNTER — Other Ambulatory Visit: Payer: Medicare Other | Admitting: Student

## 2021-08-08 DIAGNOSIS — Z515 Encounter for palliative care: Secondary | ICD-10-CM

## 2021-08-08 DIAGNOSIS — I25118 Atherosclerotic heart disease of native coronary artery with other forms of angina pectoris: Secondary | ICD-10-CM

## 2021-08-08 DIAGNOSIS — N390 Urinary tract infection, site not specified: Secondary | ICD-10-CM

## 2021-08-08 NOTE — Progress Notes (Signed)
? ? ?[facility] ?[facility] Consult Note ?Telephone: [phone]  ?Fax: [phone]  ? ? ?Date of encounter: 08/08/21 ?9:49 AM ?PATIENT NAME: Kristen Dudley ?[address] ?[place] [address]   ?[phone] (home)  ?DOB: 08-02-1927 ?MRN: [id number] ?PRIMARY CARE PROVIDER:    ?Pcp, No,  ?No address on file ?None ? ?REFERRING PROVIDER:   ?Lucky Cowboy, NP ?  ? ?RESPONSIBLE PARTY:    ?Contact Information   ? ? Name Relation Home Work Mobile  ? Acord,david Son   [phone]  ? Sypher,Dreama Relative   [phone]  ? ?  ? ? ? ?I met face to face with patient and family in the home. Palliative Care was asked to follow this patient by consultation request of  Lucky Cowboy, NP to address advance care planning and complex medical decision making. This is a follow up visit. ? ?                                 ASSESSMENT AND PLAN / RECOMMENDATIONS:  ? ?Advance Care Planning/Goals of Care: Goals include to maximize quality of life and symptom management. Patient/health care surrogate gave his/her permission to discuss. ?CODE STATUS: DNR ? ?Patient wishes to limit hospitalizations, be managed in the home. She is interested in in-home providers. Will have palliative team send information to [facility]. Palliative will continue to provide symptom management, ongoing support.  ? ?Symptom Management/Plan: ? ?CAD-patient seen in ED x 1 since last palliative visit for chest pain. She denies any recent episodes of chest pain. She had not started the Imdur or metoprolol per discharge instructions. Education provided on disease process and medication; she states she will start them. She does endorse occasional shortness of breath with exertion. Continue aspirin and Plavix as directed.  ? ?Recurrent UTI's-denies any urinary complaints today. She was to start UTI Stat but believes the cranberry is causing her tingling in her throat, ? Allergy. She is encouraged to drink adequate fluids,  limit caffeine.  ? ?Follow up Palliative Care Visit: Palliative care will continue to follow for complex medical decision making, advance care planning, and clarification of goals. Return in 8 weeks or prn. ? ? ?This visit was coded based on medical decision making (MDM). ? ?PPS: 50% ? ?HOSPICE ELIGIBILITY/DIAGNOSIS: TBD ? ?Chief Complaint: Palliative Medicine follow up visit.  ? ?HISTORY OF PRESENT ILLNESS:  Kristen Dudley is a 88 y.o. year old female  with CAD, hypertension, hyperlipidemia, TIAs, PVD, GERD, stenosis of right carotid artery, AAA, CKD 3, hx of femur fracture.   ? ?Patient reports doing well. She did have one ED visit d/t chest pain. She was started on Imdur but metoprolol but states she had not started taking due to side effects. Reports doing well. Sleeping good. Appetite is good. She states she has been moving her bowel without difficulty. She denies any functional declines. No recent UTI; she did start UTI Stat supplement, but states it "causes tingling in my throat" so she stopped it. A 10-point ROS is negative, except for the pertinent positives and negatives detailed per the HPI.  ? ?History obtained from review of EMR, discussion with primary team, and interview with family, facility staff/caregiver and/or Ms. Rohde.  ?I reviewed available labs, medications, imaging, studies and related documents from the EMR.  Records reviewed and summarized above.  ? ?Physical Exam: ? ?Pulse 82, resp, 16 sats, 122/70, sats 97% on room  air ?Constitutional: NAD ?General: frail appearing ?EYES: anicteric sclera, lids intact, no discharge  ?ENMT: hard of hearing, oral mucous membranes moist, dentition intact ?CV: S1S2, RRR, no LE edema ?Pulmonary: LCTA, no increased work of breathing, no cough, room air ?Abdomen:  normo-active BS + 4 quadrants, soft and non tender, no ascites ?GU: deferred ?MSK: moves all extremities, ambulatory ?Skin: warm and dry, no rashes or wounds on visible skin ?Neuro:  no generalized  weakness,  no cognitive impairment ?Psych: non-anxious affect, A and O x 3 ?Hem/lymph/immuno: no widespread bruising ? ? ?Thank you for the opportunity to participate in the care of Ms. Keltner.  The palliative care team will continue to follow. Please call our office at [phone] if we can be of additional assistance.  ? ?Ezekiel Slocumb, NP  ? ?COVID-19 PATIENT SCREENING TOOL ?Asked and negative response unless otherwise noted:  ? ?Have you had symptoms of covid, tested positive or been in contact with someone with symptoms/positive test in the past 5-10 days? No ? ?

## 2021-08-13 ENCOUNTER — Telehealth: Payer: Self-pay

## 2021-08-13 NOTE — Telephone Encounter (Signed)
PC SW sent new patient referral to home PCP provider, [facility], per [facility] NP- L. Rivers request. ?

## 2021-09-27 ENCOUNTER — Ambulatory Visit: Payer: Medicare Other | Admitting: Podiatry

## 2021-10-11 ENCOUNTER — Other Ambulatory Visit: Payer: Medicare Other | Admitting: Student

## 2021-10-11 ENCOUNTER — Ambulatory Visit: Payer: Medicare Other | Admitting: Podiatry

## 2021-10-11 DIAGNOSIS — N390 Urinary tract infection, site not specified: Secondary | ICD-10-CM

## 2021-10-11 DIAGNOSIS — Z91148 Patient's other noncompliance with medication regimen for other reason: Secondary | ICD-10-CM

## 2021-10-11 DIAGNOSIS — Z515 Encounter for palliative care: Secondary | ICD-10-CM

## 2021-10-11 DIAGNOSIS — I25118 Atherosclerotic heart disease of native coronary artery with other forms of angina pectoris: Secondary | ICD-10-CM

## 2021-10-11 NOTE — Progress Notes (Signed)
[place] Consult Note Telephone: [phone]  Fax: [phone]    Date of encounter: 10/11/21 9:47 AM PATIENT NAME: Kristen Dudley [address] [place] [address]   [phone] (home)  DOB: [birth year] MRN: [id number] PRIMARY CARE PROVIDER:    [facility]  REFERRING PROVIDER:   No referring provider defined for this encounter. N/A  RESPONSIBLE PARTY:    Contact Information     Name Relation Home Work Mobile   Kimple,david Son   [phone]   Jacqui, Headen Relative   [phone]        I met face to face with patient and family in the home. Palliative Care was asked to follow this patient by consultation request of [facility] to address advance care planning and complex medical decision making. This is a follow up visit.                                   ASSESSMENT AND PLAN / RECOMMENDATIONS:   Advance Care Planning/Goals of Care: Goals include to maximize quality of life and symptom management. Patient/health care surrogate gave his/her permission to discuss.  CODE STATUS: DNR  Palliative will continue to provide ongoing support, symptom management.   Symptom Management/Plan:  CAD- patient denies any recent episodes of chest pain. She does endorse occasional shortness of breath with exertion. Continue aspirin and plavix as directed.   Recurrent UTI's-patient treated for UTI in the past month. Adequate fluids encouraged. Education provided on completing full course of antibiotics.   Pill burden-patient expresses pill burden. She would like to stop her magnesium. Last magnesium 1.7; education provided on medicine. She will stop medication, but discussed restarting should she need to.   Follow up Palliative Care Visit: Palliative care will continue to follow for complex medical decision making, advance care planning, and clarification of goals. Return in 8 weeks or prn.   This visit was coded based on  medical decision making (MDM).  PPS: 50%  HOSPICE ELIGIBILITY/DIAGNOSIS: TBD  Chief Complaint: Palliative Medicine follow up visit.   HISTORY OF PRESENT ILLNESS:  Kristen Dudley is a 88 y.o. year old female  with CAD, hypertension, hyperlipidemia, TIAs, PVD, GERD, stenosis of right carotid artery, AAA, CKD 3, hx of femur fracture.    Patient reports doing well. Currently being followed by [facility] in the home. Has had 1 UTI since last palliative visit. No chest pain or generalized pain. Shortness of breath with exertion. Endorses good appetite. Sleeping good. Expresses pill burden; she would like to stop magnesium as this is harder for her to take. A 10-point ROS is negative, except for the pertinent positives and negatives detailed per the HPI.     History obtained from review of EMR, discussion with primary team, and interview with family, facility staff/caregiver and/or Ms. Loria.  I reviewed available labs, medications, imaging, studies and related documents from the EMR.  Records reviewed and summarized above.    Physical Exam: Pulse 80 , resp 16, b/p 120/70, sats 96% on room air.  Constitutional: NAD General: frail appearing EYES: anicteric sclera, lids intact, no discharge  ENMT: slight hard of hearing, oral mucous membranes moist CV: S1S2, RRR, no LE edema Pulmonary: LCTA, no increased work of breathing, no cough, room air Abdomen: normo-active BS + 4 quadrants, soft and non tender GU: deferred MSK: no sarcopenia, moves all extremities, ambulatory Skin: warm and dry, no  rashes or wounds on visible skin Neuro:  no generalized weakness,  no cognitive impairment Psych: non-anxious affect, A and O x 3 Hem/lymph/immuno: no widespread bruising   Thank you for the opportunity to participate in the care of Kristen Dudley.  The palliative care team will continue to follow. Please call our office at [phone] if we can be of additional assistance.   Ezekiel Slocumb, NP    COVID-19 PATIENT SCREENING TOOL Asked and negative response unless otherwise noted:   Have you had symptoms of covid, tested positive or been in contact with someone with symptoms/positive test in the past 5-10 days? No

## 2021-11-30 ENCOUNTER — Other Ambulatory Visit: Payer: Medicare Other | Admitting: Student

## 2021-12-07 ENCOUNTER — Other Ambulatory Visit: Payer: Medicare Other | Admitting: Student

## 2021-12-07 DIAGNOSIS — N390 Urinary tract infection, site not specified: Secondary | ICD-10-CM

## 2021-12-07 DIAGNOSIS — Z515 Encounter for palliative care: Secondary | ICD-10-CM

## 2021-12-07 DIAGNOSIS — I25118 Atherosclerotic heart disease of native coronary artery with other forms of angina pectoris: Secondary | ICD-10-CM

## 2021-12-07 NOTE — Progress Notes (Unsigned)
[facility] Consult Note Telephone: [phone]  Fax: [phone]    Date of encounter: 12/07/21 9:35 AM PATIENT NAME: Kristen Dudley [address] [place] [address]   [phone] (home)  DOB: 26-Jul-1927 MRN: [id number] PRIMARY CARE PROVIDER:    Pcp, No,  No address on file None  REFERRING PROVIDER:   No referring provider defined for this encounter. N/A  RESPONSIBLE PARTY:    Contact Information     Name Relation Home Work Mobile   Kristen Dudley,Kristen Dudley Son   [phone]   Kristen Dudley, Kristen Dudley Relative   [phone]        I met face to face with patient and family in the home. Palliative Care was asked to follow this patient by consultation request of  [facility] to address advance care planning and complex medical decision making. This is a follow up visit.                                   ASSESSMENT AND PLAN / RECOMMENDATIONS:   Advance Care Planning/Goals of Care: Goals include to maximize quality of life and symptom management. Patient/health care surrogate gave his/her permission to discuss. CODE STATUS: DNR  Palliative Medicine will continue to provide supportive care, symptom management as needed.   Symptom Management/Plan:  CAD- denies chest pain. She does endorse occasional shortness of breath with exertion. She is able to complete adl's. Continue aspirin and Plavix as directed.   Recurrent UTI's-no recent infections. Recommend adequate fluids.   Follow up Palliative Care Visit: Palliative care will continue to follow for complex medical decision making, advance care planning, and clarification of goals. Return in 8 weeks or prn.  This visit was coded based on medical decision making (MDM).  PPS: 50%  HOSPICE ELIGIBILITY/DIAGNOSIS: TBD  Chief Complaint: Palliative Medicine follow up visit.   HISTORY OF PRESENT ILLNESS:  Kristen Dudley is a 88 y.o. year old female  with CAD, hypertension, hyperlipidemia,  TIAs, PVD, GERD, stenosis of right carotid artery, AAA, CKD 3, hx of femur fracture.     Patient reports doing well. She denies pain, chest pain. She endorses occasional shortness of breath with exertion. Appetite has been good. No falls. No recent urinary tract infections. No functional declines reported. No ED visits or hospitalizations.   History obtained from review of EMR, discussion with primary team, and interview with family, facility staff/caregiver and/or Ms. Fretz.  I reviewed available labs, medications, imaging, studies and related documents from the EMR.  Records reviewed and summarized above.   ROS  A 10-Point ROS is negative, except for the pertinent positives/negatives detailed her the HPI.   Physical Exam: Pulse 74, resp 16, b/p 130/78 , sats 96% on room air Constitutional: NAD General: frail appearing EYES: anicteric sclera, lids intact, no discharge  ENMT: slight hard of hearing, oral mucous membranes moist CV: S1S2, RRR, no LE edema Pulmonary: LCTA, no increased work of breathing, no cough, room air Abdomen: normo-active BS + 4 quadrants, soft and non tender, no ascites GU: deferred MSK: moves all extremities, ambulatory Skin: warm and dry, no rashes or wounds on visible skin Neuro:  no generalized weakness,  no cognitive impairment Psych: non-anxious affect, A and O x 3 Hem/lymph/immuno: no widespread bruising   Thank you for the opportunity to participate in the care of [name].  The palliative care team will continue to follow. Please call our  office at [phone] if we can be of additional assistance.   LaToya S Rivers, NP   COVID-19 PATIENT SCREENING TOOL Asked and negative response unless otherwise noted:   Have you had symptoms of covid, tested positive or been in contact with someone with symptoms/positive test in the past 5-10 days?  No 

## 2022-02-07 ENCOUNTER — Other Ambulatory Visit: Payer: Medicare Other | Admitting: Student

## 2022-02-07 DIAGNOSIS — N390 Urinary tract infection, site not specified: Secondary | ICD-10-CM

## 2022-02-07 DIAGNOSIS — Z515 Encounter for palliative care: Secondary | ICD-10-CM

## 2022-02-07 DIAGNOSIS — K649 Unspecified hemorrhoids: Secondary | ICD-10-CM

## 2022-02-07 DIAGNOSIS — K5900 Constipation, unspecified: Secondary | ICD-10-CM

## 2022-02-07 DIAGNOSIS — I25118 Atherosclerotic heart disease of native coronary artery with other forms of angina pectoris: Secondary | ICD-10-CM

## 2022-02-07 NOTE — Progress Notes (Signed)
[place] Consult Note Telephone: [phone]  Fax: [phone]    Date of encounter: 02/07/22 9:42 AM PATIENT NAME: Kristen Dudley [address] [place] [address]   [phone] (home)  DOB: Nov 01, 1927 MRN: [id number] PRIMARY CARE PROVIDER:    [facility]   REFERRING PROVIDER:   [place]:    Contact Information     Name Relation Home Work Mobile   Stuller,david Son   [phone]   [facility] Relative   [phone]        I met face to face with patient in the home. Discussed with DIL Dreama via telephone. Palliative Care was asked to follow this patient by consultation request of  [facility] to address advance care planning and complex medical decision making. This is a follow up visit.                                   ASSESSMENT AND PLAN / RECOMMENDATIONS:   Advance Care Planning/Goals of Care: Goals include to maximize quality of life and symptom management. Patient/health care surrogate gave his/her permission to discuss. Our advance care planning conversation included a discussion about:    The value and importance of advance care planning  Experiences with loved ones who have been seriously ill or have died  Exploration of personal, cultural or spiritual beliefs that might influence medical decisions  Exploration of goals of care in the event of a sudden injury or illness  Identification of a healthcare agent  Review and updating or creation of an  advance directive document  CODE STATUS: DNR  Education provided on Palliative Medicine. We reviewed MOST form; updated today. No CPR, limited interventions; antibiotics and IV fluids as indicated, no feeding tube.  Reviewed structural changes with Palliative Medicine; patient is stable, does not meet criteria at this time. Palliative Medicine will sign off at this time. Patient and family are encouraged to call should patient  decline and need hospice services.   Symptom Management/Plan:  CAD-occasional shortness of breath with exertion, otherwise no complaints. Continue aspirin and Plavix as directed.  Constipation-continue stool softeners as directed; adequate fluids encouraged.  Recurrent UTI's-no recent UTI's; encourage adequate fluids.monitor for signs/symptoms of UTI.  Hemorrhoids-reports small amount of bright red blood a month ago. She is using preparation H PRN. We discussed monitoring for any worsening. She is to notify PCP regarding bleeding as she is on Plavix and aspirin.   Follow up Palliative Care Visit: Palliative care will continue to follow for complex medical decision making, advance care planning, and clarification of goals. Return prn.  I spent 40 minutes providing this consultation. More than 50% of the time in this consultation was spent in counseling and care coordination.   PPS: 50%  HOSPICE ELIGIBILITY/DIAGNOSIS: TBD  Chief Complaint: Palliative Medicine follow up visit.   HISTORY OF PRESENT ILLNESS:  Kristen Dudley is a 88 y.o. year old female  with  CAD, hypertension, hyperlipidemia, TIAs, PVD, GERD, stenosis of right carotid artery, AAA, CKD 3, hx of femur fracture.      Patient reports doing well. She denies pain, chest pain. Endorses occasional shortness of breath with exertion. She does endorse constipation; taking stool softeners with effectiveness. She reports having small amount of bright red bleeding about a month ago; no further episodes. She denies straining with bowel movements. She has hemorrhoids and is using  preparation H as needed. No functional declines. No  falls reported. No recent urinary tract infections. No ED visits or hospitalizations.   History obtained from review of EMR, discussion with primary team, and interview with family, facility staff/caregiver and/or Ms. Digioia.  I reviewed available labs, medications, imaging, studies and related documents from the  EMR.  Records reviewed and summarized above.   ROS  A 10-Point ROS is negative, except for the pertinent positives and negatives detailed per the HPI.   Physical Exam: Pulse 78, resp 18, b/p 112/60, sats 95% on room air Constitutional: NAD General: frail appearing EYES: anicteric sclera, lids intact, no discharge  ENMT: slight hard of hearing, oral mucous membranes moist CV: S1S2, RRR, no LE edema Pulmonary: LCTA, no increased work of breathing, no cough, room air Abdomen:  normo-active BS + 4 quadrants, soft and non tender, no ascites GU: deferred MSK: moves all extremities, ambulatory Skin: warm and dry, no rashes or wounds on visible skin Neuro:  no generalized weakness,  no cognitive impairment Psych: non-anxious affect, A and O x 3 Hem/lymph/immuno: no widespread bruising   Thank you for the opportunity to participate in the care of Ms. Strickland. Please call our office at [phone] if we can be of additional assistance.   Ezekiel Slocumb, NP   COVID-19 PATIENT SCREENING TOOL Asked and negative response unless otherwise noted:   Have you had symptoms of covid, tested positive or been in contact with someone with symptoms/positive test in the past 5-10 days? No

## 2022-02-14 ENCOUNTER — Other Ambulatory Visit: Payer: Medicare Other | Admitting: Student

## 2022-04-05 ENCOUNTER — Ambulatory Visit: Payer: Medicare Other | Admitting: Podiatry

## 2022-04-05 DIAGNOSIS — M79675 Pain in left toe(s): Secondary | ICD-10-CM | POA: Diagnosis not present

## 2022-04-05 DIAGNOSIS — B351 Tinea unguium: Secondary | ICD-10-CM

## 2022-04-05 DIAGNOSIS — M79674 Pain in right toe(s): Secondary | ICD-10-CM

## 2022-04-05 NOTE — Progress Notes (Signed)
   No chief complaint on file.   SUBJECTIVE Patient presents to office today complaining of elongated, thickened nails that cause pain while ambulating in shoes.  Patient is unable to trim their own nails. Patient is here for further evaluation and treatment.  Past Medical History:  Diagnosis Date   Hearing loss    PVD (peripheral vascular disease) (HCC)     Allergies  Allergen Reactions   Codeine Other (See Comments)    agitation   Iohexol     "makes me pass out"  *Pt got IV contrast during a code stroke on 11/14/20 without any pre-meds and did just fine.     OBJECTIVE General Patient is awake, alert, and oriented x 3 and in no acute distress. Derm Skin is dry and supple bilateral. Negative open lesions or macerations. Remaining integument unremarkable. Nails are tender, long, thickened and dystrophic with subungual debris, consistent with onychomycosis, 1-5 bilateral. No signs of infection noted. Vasc  DP and PT pedal pulses palpable bilaterally. Temperature gradient within normal limits.  Neuro Epicritic and protective threshold sensation grossly intact bilaterally.  Musculoskeletal Exam No symptomatic pedal deformities noted bilateral. Muscular strength within normal limits.  ASSESSMENT 1.  Pain due to onychomycosis of toenails both  PLAN OF CARE 1. Patient evaluated today.  2. Instructed to maintain good pedal hygiene and foot care.  3. Mechanical debridement of nails 1-5 bilaterally performed using a nail nipper. Filed with dremel without incident.  4. Return to clinic in 3 mos.    Edrick Kins, DPM [facility]  Dr. Edrick Kins, DPM    [address], [place] [address]                Office [phone]  Fax [phone]

## 2022-04-13 ENCOUNTER — Other Ambulatory Visit: Payer: Self-pay

## 2022-04-13 ENCOUNTER — Inpatient Hospital Stay
Admission: EM | Admit: 2022-04-13 | Discharge: 2022-05-02 | DRG: 872 | Disposition: E | Payer: Medicare Other | Attending: Internal Medicine | Admitting: Internal Medicine

## 2022-04-13 ENCOUNTER — Inpatient Hospital Stay: Payer: Medicare Other

## 2022-04-13 ENCOUNTER — Emergency Department: Payer: Medicare Other

## 2022-04-13 DIAGNOSIS — Z9049 Acquired absence of other specified parts of digestive tract: Secondary | ICD-10-CM

## 2022-04-13 DIAGNOSIS — A4151 Sepsis due to Escherichia coli [E. coli]: Principal | ICD-10-CM | POA: Diagnosis present

## 2022-04-13 DIAGNOSIS — N183 Chronic kidney disease, stage 3 unspecified: Secondary | ICD-10-CM | POA: Diagnosis present

## 2022-04-13 DIAGNOSIS — I129 Hypertensive chronic kidney disease with stage 1 through stage 4 chronic kidney disease, or unspecified chronic kidney disease: Secondary | ICD-10-CM | POA: Diagnosis present

## 2022-04-13 DIAGNOSIS — Z8673 Personal history of transient ischemic attack (TIA), and cerebral infarction without residual deficits: Secondary | ICD-10-CM | POA: Diagnosis not present

## 2022-04-13 DIAGNOSIS — Z6830 Body mass index (BMI) 30.0-30.9, adult: Secondary | ICD-10-CM | POA: Diagnosis not present

## 2022-04-13 DIAGNOSIS — Z87891 Personal history of nicotine dependence: Secondary | ICD-10-CM | POA: Diagnosis not present

## 2022-04-13 DIAGNOSIS — E871 Hypo-osmolality and hyponatremia: Secondary | ICD-10-CM | POA: Diagnosis present

## 2022-04-13 DIAGNOSIS — Z7982 Long term (current) use of aspirin: Secondary | ICD-10-CM | POA: Diagnosis not present

## 2022-04-13 DIAGNOSIS — A419 Sepsis, unspecified organism: Secondary | ICD-10-CM | POA: Diagnosis not present

## 2022-04-13 DIAGNOSIS — E66811 Obesity, class 1: Secondary | ICD-10-CM | POA: Diagnosis present

## 2022-04-13 DIAGNOSIS — Z7902 Long term (current) use of antithrombotics/antiplatelets: Secondary | ICD-10-CM | POA: Diagnosis not present

## 2022-04-13 DIAGNOSIS — E785 Hyperlipidemia, unspecified: Secondary | ICD-10-CM | POA: Diagnosis present

## 2022-04-13 DIAGNOSIS — Z8249 Family history of ischemic heart disease and other diseases of the circulatory system: Secondary | ICD-10-CM

## 2022-04-13 DIAGNOSIS — I739 Peripheral vascular disease, unspecified: Secondary | ICD-10-CM | POA: Diagnosis present

## 2022-04-13 DIAGNOSIS — E669 Obesity, unspecified: Secondary | ICD-10-CM | POA: Diagnosis present

## 2022-04-13 DIAGNOSIS — A4159 Other Gram-negative sepsis: Secondary | ICD-10-CM | POA: Diagnosis present

## 2022-04-13 DIAGNOSIS — I2510 Atherosclerotic heart disease of native coronary artery without angina pectoris: Secondary | ICD-10-CM | POA: Diagnosis present

## 2022-04-13 DIAGNOSIS — N390 Urinary tract infection, site not specified: Secondary | ICD-10-CM | POA: Insufficient documentation

## 2022-04-13 DIAGNOSIS — R1011 Right upper quadrant pain: Principal | ICD-10-CM

## 2022-04-13 DIAGNOSIS — N10 Acute tubulo-interstitial nephritis: Secondary | ICD-10-CM | POA: Diagnosis present

## 2022-04-13 DIAGNOSIS — E876 Hypokalemia: Secondary | ICD-10-CM | POA: Insufficient documentation

## 2022-04-13 DIAGNOSIS — Z66 Do not resuscitate: Secondary | ICD-10-CM | POA: Diagnosis present

## 2022-04-13 DIAGNOSIS — N1831 Chronic kidney disease, stage 3a: Secondary | ICD-10-CM | POA: Diagnosis present

## 2022-04-13 DIAGNOSIS — I700 Atherosclerosis of aorta: Secondary | ICD-10-CM | POA: Diagnosis present

## 2022-04-13 DIAGNOSIS — Z803 Family history of malignant neoplasm of breast: Secondary | ICD-10-CM | POA: Diagnosis not present

## 2022-04-13 DIAGNOSIS — K838 Other specified diseases of biliary tract: Secondary | ICD-10-CM | POA: Diagnosis not present

## 2022-04-13 DIAGNOSIS — R309 Painful micturition, unspecified: Secondary | ICD-10-CM | POA: Diagnosis present

## 2022-04-13 DIAGNOSIS — Z79899 Other long term (current) drug therapy: Secondary | ICD-10-CM

## 2022-04-13 DIAGNOSIS — H9190 Unspecified hearing loss, unspecified ear: Secondary | ICD-10-CM | POA: Diagnosis present

## 2022-04-13 DIAGNOSIS — Z90710 Acquired absence of both cervix and uterus: Secondary | ICD-10-CM | POA: Diagnosis not present

## 2022-04-13 DIAGNOSIS — I10 Essential (primary) hypertension: Secondary | ICD-10-CM | POA: Diagnosis present

## 2022-04-13 DIAGNOSIS — D75838 Other thrombocytosis: Secondary | ICD-10-CM | POA: Insufficient documentation

## 2022-04-13 LAB — COMPREHENSIVE METABOLIC PANEL
ALT: 12 U/L (ref 0–44)
AST: 16 U/L (ref 15–41)
Albumin: 3.9 g/dL (ref 3.5–5.0)
Alkaline Phosphatase: 86 U/L (ref 38–126)
Anion gap: 11 (ref 5–15)
BUN: 17 mg/dL (ref 8–23)
CO2: 26 mmol/L (ref 22–32)
Calcium: 9.5 mg/dL (ref 8.9–10.3)
Chloride: 99 mmol/L (ref 98–111)
Creatinine, Ser: 1.08 mg/dL — ABNORMAL HIGH (ref 0.44–1.00)
GFR, Estimated: 48 mL/min — ABNORMAL LOW (ref >60)
Glucose, Bld: 116 mg/dL — ABNORMAL HIGH (ref 70–99)
Potassium: 3.6 mmol/L (ref 3.5–5.1)
Sodium: 136 mmol/L (ref 135–145)
Total Bilirubin: 1.8 mg/dL — ABNORMAL HIGH (ref 0.3–1.2)
Total Protein: 8 g/dL (ref 6.5–8.1)

## 2022-04-13 LAB — URINALYSIS, ROUTINE W REFLEX MICROSCOPIC
Bilirubin Urine: NEGATIVE
Glucose, UA: NEGATIVE mg/dL
Hgb urine dipstick: NEGATIVE
Ketones, ur: NEGATIVE mg/dL
Nitrite: NEGATIVE
Protein, ur: 30 mg/dL — AB
Specific Gravity, Urine: 1.016 (ref 1.005–1.030)
pH: 5 (ref 5.0–8.0)

## 2022-04-13 LAB — CBC WITH DIFFERENTIAL/PLATELET
Abs Immature Granulocytes: 0.07 10*3/uL (ref 0.00–0.07)
Basophils Absolute: 0.1 10*3/uL (ref 0.0–0.1)
Basophils Relative: 1 %
Eosinophils Absolute: 0.1 10*3/uL (ref 0.0–0.5)
Eosinophils Relative: 1 %
HCT: 50.1 % — ABNORMAL HIGH (ref 36.0–46.0)
Hemoglobin: 15.7 g/dL — ABNORMAL HIGH (ref 12.0–15.0)
Immature Granulocytes: 1 %
Lymphocytes Relative: 18 %
Lymphs Abs: 2.5 10*3/uL (ref 0.7–4.0)
MCH: 25.4 pg — ABNORMAL LOW (ref 26.0–34.0)
MCHC: 31.3 g/dL (ref 30.0–36.0)
MCV: 80.9 fL (ref 80.0–100.0)
Monocytes Absolute: 1.4 10*3/uL — ABNORMAL HIGH (ref 0.1–1.0)
Monocytes Relative: 10 %
Neutro Abs: 9.5 10*3/uL — ABNORMAL HIGH (ref 1.7–7.7)
Neutrophils Relative %: 69 %
Platelets: 677 10*3/uL — ABNORMAL HIGH (ref 150–400)
RBC: 6.19 MIL/uL — ABNORMAL HIGH (ref 3.87–5.11)
RDW: 17 % — ABNORMAL HIGH (ref 11.5–15.5)
WBC: 13.5 10*3/uL — ABNORMAL HIGH (ref 4.0–10.5)
nRBC: 0 % (ref 0.0–0.2)

## 2022-04-13 LAB — PROTIME-INR
INR: 1.3 — ABNORMAL HIGH (ref 0.8–1.2)
Prothrombin Time: 15.7 seconds — ABNORMAL HIGH (ref 11.4–15.2)

## 2022-04-13 LAB — LIPASE, BLOOD: Lipase: 48 U/L (ref 11–51)

## 2022-04-13 LAB — APTT: aPTT: 50 seconds — ABNORMAL HIGH (ref 24–36)

## 2022-04-13 LAB — PROCALCITONIN: Procalcitonin: 0.11 ng/mL

## 2022-04-13 LAB — LACTIC ACID, PLASMA: Lactic Acid, Venous: 1 mmol/L (ref 0.5–1.9)

## 2022-04-13 MED ORDER — ONDANSETRON HCL 4 MG PO TABS: 4.0000 mg | ORAL_TABLET | Freq: Four times a day (QID) | ORAL | Status: DC | PRN

## 2022-04-13 MED ORDER — ONDANSETRON HCL 4 MG/2ML IJ SOLN
4.0000 mg | Freq: Four times a day (QID) | INTRAMUSCULAR | Status: DC | PRN
  Administered 2022-04-14: 4 mg via INTRAVENOUS
  Filled 2022-04-13: qty 2

## 2022-04-13 MED ORDER — NITROGLYCERIN 0.4 MG SL SUBL: 0.4000 mg | SUBLINGUAL_TABLET | SUBLINGUAL | Status: DC | PRN

## 2022-04-13 MED ORDER — FENTANYL CITRATE PF 50 MCG/ML IJ SOSY
50.0000 ug | PREFILLED_SYRINGE | Freq: Once | INTRAMUSCULAR | Status: AC
  Administered 2022-04-13: 50 ug via INTRAVENOUS
  Filled 2022-04-13: qty 1

## 2022-04-13 MED ORDER — ACETAMINOPHEN 650 MG RE SUPP: 650.0000 mg | Freq: Four times a day (QID) | RECTAL | Status: DC | PRN

## 2022-04-13 MED ORDER — METOPROLOL TARTRATE 25 MG PO TABS: 25.0000 mg | ORAL_TABLET | Freq: Two times a day (BID) | ORAL | Status: DC

## 2022-04-13 MED ORDER — GADOBUTROL 1 MMOL/ML IV SOLN
7.5000 mL | Freq: Once | INTRAVENOUS | Status: AC | PRN
  Administered 2022-04-13: 7.5 mL via INTRAVENOUS

## 2022-04-13 MED ORDER — HEPARIN SODIUM (PORCINE) 5000 UNIT/ML IJ SOLN
5000.0000 [IU] | Freq: Three times a day (TID) | INTRAMUSCULAR | Status: DC
  Administered 2022-04-13 – 2022-04-15 (×7): 5000 [IU] via SUBCUTANEOUS
  Filled 2022-04-13 (×7): qty 1

## 2022-04-13 MED ORDER — AMLODIPINE BESYLATE 5 MG PO TABS: 5.0000 mg | ORAL_TABLET | Freq: Every day | ORAL | Status: DC

## 2022-04-13 MED ORDER — TRAZODONE HCL 50 MG PO TABS: 25.0000 mg | ORAL_TABLET | Freq: Every evening | ORAL | Status: DC | PRN

## 2022-04-13 MED ORDER — SODIUM CHLORIDE 0.9 % IV SOLN
1.0000 g | Freq: Three times a day (TID) | INTRAVENOUS | Status: DC
  Administered 2022-04-13: 1 g via INTRAVENOUS
  Filled 2022-04-13: qty 20

## 2022-04-13 MED ORDER — OXYCODONE-ACETAMINOPHEN 5-325 MG PO TABS
1.0000 | ORAL_TABLET | ORAL | Status: DC | PRN
  Administered 2022-04-13 – 2022-04-14 (×6): 1 via ORAL
  Filled 2022-04-13 (×6): qty 1

## 2022-04-13 MED ORDER — DOCUSATE SODIUM 100 MG PO CAPS
100.0000 mg | ORAL_CAPSULE | Freq: Every day | ORAL | Status: DC
  Administered 2022-04-13 – 2022-04-14 (×2): 100 mg via ORAL
  Filled 2022-04-13 (×2): qty 1

## 2022-04-13 MED ORDER — MAGNESIUM HYDROXIDE 400 MG/5ML PO SUSP: 30.0000 mL | Freq: Every day | ORAL | Status: DC | PRN

## 2022-04-13 MED ORDER — ENOXAPARIN SODIUM 300 MG/3ML IJ SOLN: 0.5000 mg/kg | INTRAMUSCULAR | Status: DC

## 2022-04-13 MED ORDER — MAGNESIUM OXIDE 400 MG PO TABS: 400.0000 mg | ORAL_TABLET | Freq: Every day | ORAL | Status: DC

## 2022-04-13 MED ORDER — IOHEXOL 300 MG/ML  SOLN
75.0000 mL | Freq: Once | INTRAMUSCULAR | Status: AC | PRN
  Administered 2022-04-13: 75 mL via INTRAVENOUS

## 2022-04-13 MED ORDER — ALBUTEROL SULFATE (2.5 MG/3ML) 0.083% IN NEBU: 3.0000 mL | INHALATION_SOLUTION | Freq: Four times a day (QID) | RESPIRATORY_TRACT | Status: DC | PRN

## 2022-04-13 MED ORDER — ONDANSETRON 4 MG PO TBDP
4.0000 mg | ORAL_TABLET | Freq: Once | ORAL | Status: AC
  Administered 2022-04-13: 4 mg via ORAL
  Filled 2022-04-13: qty 1

## 2022-04-13 MED ORDER — POTASSIUM CHLORIDE IN NACL 20-0.9 MEQ/L-% IV SOLN
INTRAVENOUS | Status: DC
  Filled 2022-04-13: qty 1000

## 2022-04-13 MED ORDER — SODIUM CHLORIDE 0.9 % IV SOLN: INTRAVENOUS | Status: DC

## 2022-04-13 MED ORDER — CEPHALEXIN 500 MG PO CAPS
500.0000 mg | ORAL_CAPSULE | Freq: Once | ORAL | Status: AC
  Administered 2022-04-13: 500 mg via ORAL
  Filled 2022-04-13: qty 1

## 2022-04-13 MED ORDER — METHYLPREDNISOLONE SODIUM SUCC 40 MG IJ SOLR
40.0000 mg | Freq: Once | INTRAMUSCULAR | Status: DC
  Filled 2022-04-13: qty 1

## 2022-04-13 MED ORDER — SODIUM CHLORIDE 0.9 % IV SOLN: 2.0000 g | INTRAVENOUS | Status: DC

## 2022-04-13 MED ORDER — ASPIRIN 81 MG PO TBEC
81.0000 mg | DELAYED_RELEASE_TABLET | Freq: Every day | ORAL | Status: DC
  Administered 2022-04-13 – 2022-04-14 (×2): 81 mg via ORAL
  Filled 2022-04-13 (×2): qty 1

## 2022-04-13 MED ORDER — DIPHENHYDRAMINE HCL 25 MG PO CAPS: 50.0000 mg | ORAL_CAPSULE | Freq: Once | ORAL | Status: DC

## 2022-04-13 MED ORDER — ONDANSETRON HCL 4 MG/2ML IJ SOLN
4.0000 mg | Freq: Once | INTRAMUSCULAR | Status: AC
  Administered 2022-04-13: 4 mg via INTRAVENOUS
  Filled 2022-04-13: qty 2

## 2022-04-13 MED ORDER — ISOSORBIDE MONONITRATE ER 60 MG PO TB24: 30.0000 mg | ORAL_TABLET | Freq: Every day | ORAL | Status: DC

## 2022-04-13 MED ORDER — SODIUM CHLORIDE 0.9 % IV SOLN
1.0000 g | Freq: Two times a day (BID) | INTRAVENOUS | Status: DC
  Administered 2022-04-13 – 2022-04-14 (×3): 1 g via INTRAVENOUS
  Filled 2022-04-13: qty 1
  Filled 2022-04-13: qty 20
  Filled 2022-04-13: qty 1
  Filled 2022-04-13: qty 20

## 2022-04-13 MED ORDER — ATORVASTATIN CALCIUM 20 MG PO TABS
20.0000 mg | ORAL_TABLET | Freq: Every day | ORAL | Status: DC
  Administered 2022-04-13 – 2022-04-14 (×2): 20 mg via ORAL
  Filled 2022-04-13 (×2): qty 1

## 2022-04-13 MED ORDER — OXYCODONE-ACETAMINOPHEN 5-325 MG PO TABS
1.0000 | ORAL_TABLET | Freq: Once | ORAL | Status: AC
  Administered 2022-04-13: 1 via ORAL
  Filled 2022-04-13: qty 1

## 2022-04-13 MED ORDER — CLOPIDOGREL BISULFATE 75 MG PO TABS: 75.0000 mg | ORAL_TABLET | Freq: Every day | ORAL | Status: DC

## 2022-04-13 MED ORDER — SODIUM CHLORIDE 0.9 % IV BOLUS (SEPSIS)
1000.0000 mL | Freq: Once | INTRAVENOUS | Status: AC
  Administered 2022-04-13: 1000 mL via INTRAVENOUS

## 2022-04-13 MED ORDER — ACETAMINOPHEN 325 MG PO TABS: 650.0000 mg | ORAL_TABLET | Freq: Four times a day (QID) | ORAL | Status: DC | PRN

## 2022-04-13 MED ORDER — PANTOPRAZOLE SODIUM 40 MG PO TBEC
40.0000 mg | DELAYED_RELEASE_TABLET | Freq: Every day | ORAL | Status: DC
  Administered 2022-04-13 – 2022-04-14 (×2): 40 mg via ORAL
  Filled 2022-04-13 (×2): qty 1

## 2022-04-13 MED ORDER — FENTANYL CITRATE PF 50 MCG/ML IJ SOSY
12.5000 ug | PREFILLED_SYRINGE | INTRAMUSCULAR | Status: DC | PRN
  Administered 2022-04-15: 12.5 ug via INTRAVENOUS
  Filled 2022-04-13: qty 1

## 2022-04-13 MED ORDER — DIPHENHYDRAMINE HCL 50 MG/ML IJ SOLN: 50.0000 mg | Freq: Once | INTRAMUSCULAR | Status: DC

## 2022-04-13 NOTE — ED Triage Notes (Addendum)
Arrives EMS from home. Describes upper and right lower abdominal pain radiating to groin since this past thursday. Says she suspects she has a kidney stone as she has had them in the past.   Upper abdominal pain worst with palpation and movement.   Lower abdominal pain associated with burning and subjective hematuria.

## 2022-04-13 NOTE — Progress Notes (Signed)
Patient received from ED to Room 106A . Alert and oriented4, On RA and purewick. Telemetry is placed.

## 2022-04-13 NOTE — ED Provider Notes (Signed)
[facility] Provider Note    Event Date/Time   First MD Initiated Contact with Patient 04/25/2022 0424     (approximate)   History   Abdominal Pain   HPI  Kristen Dudley is a 89 y.o. female with history of peripheral vascular disease, hypertension, hyperlipidemia who presents emergency department with right-sided abdominal pain that radiates into the right groin started today with nausea.  No vomiting or diarrhea.  Has had dysuria and hematuria.  History of kidney stones and states this feels similar.  Reports previous cholecystectomy, appendectomy and hysterectomy.   History provided by patient and EMS.    Past Medical History:  Diagnosis Date   Hearing loss    PVD (peripheral vascular disease) (HCC)     Past Surgical History:  Procedure Laterality Date   ABDOMINAL HYSTERECTOMY     CATARACT EXTRACTION, BILATERAL  2007   CHOLECYSTECTOMY OPEN  1961   IR ERCP EXCH REM STENTS BIL PAN DUCT INC DIL EA STENT EXCH Right 2003   leg stents   ORIF FEMUR FRACTURE Right 2014    MEDICATIONS:  Prior to Admission medications   Medication Sig Start Date End Date Taking? Authorizing Provider  acetaminophen (TYLENOL) 500 MG tablet Take 1,000 mg by mouth every 6 (six) hours as needed for mild pain.    [provider]  albuterol (VENTOLIN HFA) 108 (90 Base) MCG/ACT inhaler Inhale 2 puffs into the lungs every 6 (six) hours as needed for wheezing or shortness of breath. 12/07/19   [provider]  amLODipine (NORVASC) 5 MG tablet Take 1 tablet (5 mg total) by mouth daily. 11/18/20 10/11/21  Atway, Derwood Kaplan, DO  aspirin EC 81 MG tablet Take 81 mg by mouth daily. Swallow whole.    [provider]  atorvastatin (LIPITOR) 20 MG tablet Take 20 mg by mouth at bedtime. 08/07/20   [provider]  clopidogrel (PLAVIX) 75 MG tablet TAKE 1 TABLET BY MOUTH EVERY DAY Patient taking differently: Take 75 mg by mouth daily. 09/04/20   Danelle Berry, PA-C   docusate sodium (COLACE) 100 MG capsule Take 100 mg by mouth daily. 06/02/21   [provider]  isosorbide mononitrate (IMDUR) 30 MG 24 hr tablet Take 1 tablet (30 mg total) by mouth daily. 06/24/21 07/24/21  Concha Se, MD  magnesium oxide (MAG-OX) 400 MG tablet Take 1 tablet by mouth daily. 03/09/21   [provider]  metoprolol tartrate (LOPRESSOR) 25 MG tablet Take 1 tablet (25 mg total) by mouth 2 (two) times daily. 06/24/21 07/24/21  Concha Se, MD  nitroGLYCERIN (NITROSTAT) 0.4 MG SL tablet Place 1 tablet (0.4 mg total) under the tongue as needed for chest pain. 11/17/20 12/17/20  Atway, Derwood Kaplan, DO  Omega-3 Fatty Acids (FISH OIL) 1000 MG CAPS Take 1,000 mg by mouth daily. Patient not taking: Reported on 06/24/2021 08/07/20   [provider]  pantoprazole (PROTONIX) 40 MG tablet TAKE 1 TABLET BY MOUTH EVERY DAY 12/10/19   Danelle Berry, PA-C    Physical Exam   Triage Vital Signs: ED Triage Vitals  Enc Vitals Group     BP 04/23/2022 0232 (!) 168/67     Pulse Rate 04/02/2022 0232 83     Resp 04/12/2022 0232 18     Temp 04/08/2022 0232 (!) 97.2 F (36.2 C)     Temp src --      SpO2 04/04/2022 0232 92 %     Weight 04/28/2022 0228 170 lb (  77.1 kg)     Height 05-05-2022 0228 5\' 3"  (1.6 m)     Head Circumference --      Peak Flow --      Pain Score May 05, 2022 0228 8     Pain Loc --      Pain Edu? --      Excl. in GC? --     Most recent vital signs: Vitals:   05-05-2022 0627 05/05/22 0630  BP: (!) 131/54 (!) 123/51  Pulse: 76 77  Resp: 18 18  Temp: 97.8 F (36.6 C)   SpO2: 98% 97%    CONSTITUTIONAL: Alert and oriented and responds appropriately to questions. Well-appearing; well-nourished early HEAD: Normocephalic, atraumatic EYES: Conjunctivae clear, pupils appear equal, sclera nonicteric ENT: normal nose; moist mucous membranes NECK: Supple, normal ROM CARD: RRR; S1 and S2 appreciated; no murmurs, no clicks, no rubs, no gallops RESP: Normal chest excursion  without splinting or tachypnea; breath sounds clear and equal bilaterally; no wheezes, no rhonchi, no rales, no hypoxia or respiratory distress, speaking full sentences ABD/GI: Normal bowel sounds; non-distended; soft, tender in the right upper quadrant without guarding or rebound BACK: The back appears normal EXT: Normal ROM in all joints; no deformity noted, no edema; no cyanosis SKIN: Normal color for age and race; warm; no rash on exposed skin NEURO: Moves all extremities equally, normal speech PSYCH: The patient's mood and manner are appropriate.   ED Results / Procedures / Treatments   LABS: (all labs ordered are listed, but only abnormal results are displayed) Labs Reviewed  COMPREHENSIVE METABOLIC PANEL - Abnormal; Notable for the following components:      Result Value   Glucose, Bld 116 (*)    Creatinine, Ser 1.08 (*)    Total Bilirubin 1.8 (*)    GFR, Estimated 48 (*)    All other components within normal limits  URINALYSIS, ROUTINE W REFLEX MICROSCOPIC - Abnormal; Notable for the following components:   Color, Urine YELLOW (*)    APPearance HAZY (*)    Protein, ur 30 (*)    Leukocytes,Ua MODERATE (*)    Bacteria, UA RARE (*)    All other components within normal limits  CBC WITH DIFFERENTIAL/PLATELET - Abnormal; Notable for the following components:   WBC 13.5 (*)    RBC 6.19 (*)    Hemoglobin 15.7 (*)    HCT 50.1 (*)    MCH 25.4 (*)    RDW 17.0 (*)    Platelets 677 (*)    Neutro Abs 9.5 (*)    Monocytes Absolute 1.4 (*)    All other components within normal limits  URINE CULTURE  LIPASE, BLOOD     EKG:   RADIOLOGY: My personal review and interpretation of imaging: CT scan shows no acute abnormality.  I have personally reviewed all radiology reports.   CT ABDOMEN PELVIS W CONTRAST  Result Date: 05-05-22 CLINICAL DATA:  89 year old female with history of acute onset of nonlocalized abdominal pain. EXAM: CT ABDOMEN AND PELVIS WITH CONTRAST TECHNIQUE:  Multidetector CT imaging of the abdomen and pelvis was performed using the standard protocol following bolus administration of intravenous contrast. RADIATION DOSE REDUCTION: This exam was performed according to the departmental dose-optimization program which includes automated exposure control, adjustment of the mA and/or kV according to patient size and/or use of iterative reconstruction technique. CONTRAST:  28mL OMNIPAQUE IOHEXOL 300 MG/ML  SOLN COMPARISON:  CT of the abdomen and pelvis May 05, 2022. FINDINGS: Lower chest: Large calcified granuloma in the base  of the right lower lobe incidentally noted. Extensive atherosclerosis in the descending thoracic aorta, as well as the left main, left anterior descending, left circumflex and right coronary arteries. Calcifications of the aortic valve and mitral annulus. Hepatobiliary: Patient is status post cholecystectomy. Severe intra and extrahepatic biliary ductal dilatation, with the common bile duct measuring up to 1.6 cm in diameter in the porta hepatis. The common bile duct abruptly terminates in an area of soft tissue prominence shortly before the level of the ampulla, best appreciated on coronal image 43 of series 5. No definite suspicious cystic or solid hepatic lesions are noted. However, in the region of the gallbladder fossa (axial image 33 of series 2) there is a focal area of amorphous soft tissue prominence measuring 1.5 x 1.3 cm. Pancreas: In the region of the pancreatic head, best appreciated on coronal image 43 of series 5 and sagittal image 45 of series 6 there is subtle soft tissue prominence with minimally increased enhancement, without a discrete measurable mass, associated with abrupt termination of the common bile duct shortly before the level of the ampulla. No other potential pancreatic mass identified. Pancreatic duct is not dilated. No peripancreatic fluid collections or inflammatory changes. Spleen: Innumerable small calcified granulomas  noted throughout the spleen. Adrenals/Urinary Tract: Vascular calcifications in the left renal hilum. Bilateral kidneys and adrenal glands are otherwise normal in appearance. No hydroureteronephrosis. Urinary bladder is nearly completely decompressed, but otherwise unremarkable in appearance. Stomach/Bowel: The appearance of the stomach is normal. There is no pathologic dilatation of small bowel or colon. Numerous colonic diverticuli are noted, particularly in the descending colon and sigmoid colon, without surrounding inflammatory changes to indicate an acute diverticulitis at this time. The appendix is not confidently identified and may be surgically absent. Regardless, there are no inflammatory changes noted adjacent to the cecum to suggest the presence of an acute appendicitis at this time. Vascular/Lymphatic: Aortic atherosclerosis with fusiform aneurysmal dilatation of the infrarenal abdominal aorta which measures up to 3.8 x 3.7 cm in diameter. Small amount of eccentric nonenhancing material within the aneurysm sac is compatible with a small amount of mural thrombus. There is also aneurysmal dilatation of the right common iliac artery (1.8 cm in diameter). No definite lymphadenopathy noted in the abdomen or pelvis. Reproductive: Status post hysterectomy. Ovaries are not confidently identified and may be surgically absent or atrophic. Other: No significant volume of ascites.  No pneumoperitoneum. Musculoskeletal: Postoperative changes of ORIF in the right hip are incidentally noted. There are no aggressive appearing lytic or blastic lesions noted in the visualized portions of the skeleton. IMPRESSION: 1. Severe intra and extrahepatic biliary ductal dilatation which appears slightly increased compared to prior examinations. In this patient with history of prior cholecystectomy, the appearance on prior studies likely reflective of benign post cholecystectomy physiology, however, today's study demonstrates subtle  soft tissue prominence and potential enhancement in the region of the ampulla, raising concern for potential ampullary neoplasm. Further evaluation with abdominal MRI with and without IV gadolinium with MRCP should be considered to better evaluate this finding. 2. No other acute findings are noted elsewhere in the abdomen or pelvis. 3. Subtle soft tissue prominence in the region of the gallbladder fossa. This is of uncertain etiology and significance, but attention should be directed to this region at time of any future abdominal MR imaging to exclude neoplasm. 4. Aortic atherosclerosis, in addition to left main and three-vessel coronary artery disease, with aneurysmal dilatation of the right common iliac artery (1.8 cm  in diameter) and infrarenal abdominal aortic aneurysm again noted measuring 3.8 x 3.7 cm. Recommend follow-up ultrasound every 2 years. This recommendation follows ACR consensus guidelines: White Paper of the ACR Incidental Findings Committee II on Vascular Findings. J Am Coll Radiol 2013; 10:789-794. 5. Extensive colonic diverticulosis without evidence of acute diverticulitis at this time. 6. Old granulomatous disease, as above. 7. Additional incidental findings, as above. Electronically Signed   By: Trudie Reed M.D.   On: Apr 26, 2022 06:44   CT Renal Stone Study  Result Date: 26-Apr-2022 CLINICAL DATA:  Right upper and lower abdominal and flank pain. Known AAA. EXAM: CT ABDOMEN AND PELVIS WITHOUT CONTRAST TECHNIQUE: Multidetector CT imaging of the abdomen and pelvis was performed following the standard protocol without IV contrast. RADIATION DOSE REDUCTION: This exam was performed according to the departmental dose-optimization program which includes automated exposure control, adjustment of the mA and/or kV according to patient size and/or use of iterative reconstruction technique. COMPARISON:  CTA chest, abdomen and pelvis 06/24/2021 FINDINGS: Lower chest: There is a calcified granuloma in  the right lower lobe. Lung bases show scattered scar-like opacities. The cardiac size is normal. There are coronary artery calcifications, scattered plaque in the mitral ring. No pericardial effusion. Hepatobiliary: Post cholecystectomy dilatation of the common bile duct is similar measuring 1.6 cm. There is no calcified ductal stone. Stable intrahepatic biliary prominence. There are scattered calcified granulomas in the liver. No mass is seen without contrast. Pancreas: Unremarkable without contrast apart from mild atrophy. Spleen: Numerous calcified granulomas.  No other focal abnormality. Adrenals/Urinary Tract: No focal abnormality in the unenhanced adrenal glands and renal cortex. There are renovascular calcifications at both renal hila without evidence of urinary stones or obstruction. The bladder is contracted and not well seen due to artifact from right hip nailing hardware. Stomach/Bowel: No dilatation or wall thickening. An appendix is not seen in this patient. There are diffuse colonic diverticula. The diverticulosis is progressively advanced along the more distal left colon than elsewhere. There are no acute inflammatory changes. Vascular/Lymphatic: Extensive vascular calcifications of the aorta and branches. Stable 3.8 x 3.7 cm infrarenal AAA. The common iliac arteries remain aneurysmal measuring 1.8 cm on the right and 1.6 cm on the left. No lymphadenopathy is seen. Reproductive: Status post hysterectomy. No adnexal masses. Other: There is no free air, free hemorrhage or free fluid. There is an umbilical fat hernia and mild umbilical rectus diastasis. There is no incarcerated hernia. Musculoskeletal: There is osteopenia and advanced degenerative change of the spine. Old right hip nailing and mild hip DJD. No suspicious bone lesions. IMPRESSION: 1. No acute noncontrast CT abnormality is seen. 2. Extensive diverticulosis without evidence of diverticulitis. 3. Stable 3.8 x 3.7 cm infrarenal AAA. Recommend  follow-up every 2 years. Reference: J Am Coll Radiol 2013;10:789-794. 4. Umbilical fat hernia and mild umbilical rectus diastasis. No incarcerated hernia. 5. Chronic postcholecystectomy biliary dilatation, unchanged. 6. Aortic and coronary artery atherosclerosis. 7. Osteopenia, postsurgical and degenerative change. Aortic Atherosclerosis (ICD10-I70.0). Electronically Signed   By: Almira Bar M.D.   On: 04/26/22 03:35     PROCEDURES:  Critical Care performed: No    Procedures    IMPRESSION / MDM / ASSESSMENT AND PLAN / ED COURSE  I reviewed the triage vital signs and the nursing notes.    Patient here with intermittent right-sided abdominal pain that feels like previous kidney stones.     DIFFERENTIAL DIAGNOSIS (includes but not limited to):   Kidney stones, UTI, pyelonephritis.  Patient is  status post cholecystectomy, appendectomy and hysterectomy.   Patient's presentation is most consistent with acute presentation with potential threat to life or bodily function.   PLAN: Patient's labs show leukocytosis of 13,000.  Normal electrolytes.  Creatinine minimally elevated at 1.08 but appears to be her baseline.  Total bilirubin minimally elevated also this appears to be her baseline.  Normal LFTs and lipase otherwise.  Patient does have moderate leukocyte esterase and 21-50,000 white blood cells with rare bacteria.  I am concerned for UTI versus pyelonephritis.  CT renal study reviewed and interpreted by myself and the radiologist and shows no ureterolithiasis, hydronephrosis or other acute abnormality.  Will give antibiotics, pain and nausea medicine and reassess.   MEDICATIONS GIVEN IN ED: Medications  meropenem (MERREM) 1 g in sodium chloride 0.9 % 100 mL IVPB (0 g Intravenous Stopped 04-27-22 0617)  aspirin EC tablet 81 mg (has no administration in time range)  amLODipine (NORVASC) tablet 5 mg (has no administration in time range)  atorvastatin (LIPITOR) tablet 20 mg (has no  administration in time range)  isosorbide mononitrate (IMDUR) 24 hr tablet 30 mg (has no administration in time range)  metoprolol tartrate (LOPRESSOR) tablet 25 mg (has no administration in time range)  nitroGLYCERIN (NITROSTAT) SL tablet 0.4 mg (has no administration in time range)  docusate sodium (COLACE) capsule 100 mg (has no administration in time range)  magnesium oxide (MAG-OX) tablet 400 mg (has no administration in time range)  pantoprazole (PROTONIX) EC tablet 40 mg (has no administration in time range)  clopidogrel (PLAVIX) tablet 75 mg (has no administration in time range)  albuterol (VENTOLIN HFA) 108 (90 Base) MCG/ACT inhaler 2 puff (has no administration in time range)  enoxaparin (LOVENOX) injection 40 mg (has no administration in time range)  0.9 % NaCl with KCl 20 mEq/ L  infusion (has no administration in time range)  cefTRIAXone (ROCEPHIN) 2 g in sodium chloride 0.9 % 100 mL IVPB (has no administration in time range)  acetaminophen (TYLENOL) tablet 650 mg (has no administration in time range)    Or  acetaminophen (TYLENOL) suppository 650 mg (has no administration in time range)  traZODone (DESYREL) tablet 25 mg (has no administration in time range)  magnesium hydroxide (MILK OF MAGNESIA) suspension 30 mL (has no administration in time range)  ondansetron (ZOFRAN) tablet 4 mg (has no administration in time range)    Or  ondansetron (ZOFRAN) injection 4 mg (has no administration in time range)  cephALEXin (KEFLEX) capsule 500 mg (500 mg Oral Given April 27, 2022 0517)  oxyCODONE-acetaminophen (PERCOCET/ROXICET) 5-325 MG per tablet 1 tablet (1 tablet Oral Given 2022/04/27 0516)  ondansetron (ZOFRAN-ODT) disintegrating tablet 4 mg (4 mg Oral Given 04/27/22 0516)  fentaNYL (SUBLIMAZE) injection 50 mcg (50 mcg Intravenous Given 04/27/22 0545)  ondansetron (ZOFRAN) injection 4 mg (4 mg Intravenous Given 04/27/22 0545)  sodium chloride 0.9 % bolus 1,000 mL (0 mLs Intravenous Stopped  27-Apr-2022 0616)  iohexol (OMNIPAQUE) 300 MG/ML solution 75 mL (75 mLs Intravenous Contrast Given 2022/04/27 0557)     ED COURSE: Patient continues to moan in pain despite oral medications.  It appears she has grown EBSL previously.  Sensitive to carbapenems.  Will give meropenem here.  Will place IV for pain and nausea medication.  Will repeat CT of the abdomen pelvis but this time with IV contrast.  She does confirm that she had a history of an adverse reaction to oral contrast before where she passed out.  She states she has never had  an allergy to IV contrast and it appears she did have IV contrast with CTs in August 2022 for code stroke and had no premedication and no reaction.  I have removed the allergy of IV contrast from her chart.  Discussed this with radiology technicians as well.   Repeat CT scan shows increase intra and extrahepatic ductal dilatation and subtle soft tissue prominence and potential enhancement in the region of the ampulla that is concerning for possible potential ampullary neoplasm.  Will obtain abdominal MRI with and without contrast with MRCP for further evaluation.  I will also send a secure chat to update the hospitalist.  This could also explain her right upper quadrant abdominal pain.  CONSULTS:  Consulted and discussed patient's case with hospitalist, Dr. Sidney Ace.  I have recommended admission and consulting physician agrees and will place admission orders.  Patient (and family if present) agree with this plan.   I reviewed all nursing notes, vitals, pertinent previous records.  All labs, EKGs, imaging ordered have been independently reviewed and interpreted by myself.    OUTSIDE RECORDS REVIEWED: Reviewed patient's last palliative care note on 02/07/2022.       FINAL CLINICAL IMPRESSION(S) / ED DIAGNOSES   Final diagnoses:  Acute UTI  RUQ pain     Rx / DC Orders   ED Discharge Orders     None        Note:  This document was prepared using Dragon  voice recognition software and may include unintentional dictation errors.   Monzerat Handler, Delice Bison, DO Apr 17, 2022 [phone]

## 2022-04-13 NOTE — H&P (Addendum)
History and Physical    Kristen Dudley RJJ:[id number] DOB: 08/10/1927 DOA: 04/10/2022  Referring MD/NP/PA:   PCP: Pcp, No   Patient coming from:  The patient is coming from home.     Chief Complaint: Abdominal pain, dysuria and burning on urination  HPI: Kristen Dudley is a 89 y.o. female with medical history significant of hypertension, hyperlipidemia, PVD, CAD, CKD-3A, AAA, carotid artery stenosis, kidney stone, hard of hearing, who presents with abdominal pain, dysuria and burning urination.  Patient states that she has abdominal pain for more than 3 days.  It is mainly located in the right side of abdomen, also involves right flank area, constant, aching, radiating to right groin area and to lower chest. Patient denies nausea, vomiting or diarrhea.  No chills, has mild subjective fever, but her temperature is normal 97.8 to ED.  She also reports dysuria and burning on urination.  No hematuria.  Patient does not have cough, shortness breath.  Data reviewed independently and ED Course: pt was found to have WBC 13.5, stable renal function, temperature normal, blood pressure 168/67 --> 117/54, heart rate 95, RR 20, oxygen saturation 92 to 98% of 2 L oxygen. Pt is admitted to telemetry bed as inpatient.  CT-renal stone: 1. No acute noncontrast CT abnormality is seen. 2. Extensive diverticulosis without evidence of diverticulitis. 3. Stable 3.8 x 3.7 cm infrarenal AAA. Recommend follow-up every 2 years. Reference: J Am Coll Radiol 0160;10:932-355. 4. Umbilical fat hernia and mild umbilical rectus diastasis. No incarcerated hernia. 5. Chronic postcholecystectomy biliary dilatation, unchanged. 6. Aortic and coronary artery atherosclerosis. 7. Osteopenia, postsurgical and degenerative change.   Aortic Atherosclerosis (ICD10-I70.0).   CT-abd/pelvis with contrast: 1. Severe intra and extrahepatic biliary ductal dilatation which appears slightly increased compared to prior examinations. In  this patient with history of prior cholecystectomy, the appearance on prior studies likely reflective of benign post cholecystectomy physiology, however, today's study demonstrates subtle soft tissue prominence and potential enhancement in the region of the ampulla, raising concern for potential ampullary neoplasm. Further evaluation with abdominal MRI with and without IV gadolinium with MRCP should be considered to better evaluate this finding. 2. No other acute findings are noted elsewhere in the abdomen or pelvis. 3. Subtle soft tissue prominence in the region of the gallbladder fossa. This is of uncertain etiology and significance, but attention should be directed to this region at time of any future abdominal MR imaging to exclude neoplasm. 4. Aortic atherosclerosis, in addition to left main and three-vessel coronary artery disease, with aneurysmal dilatation of the right common iliac artery (1.8 cm in diameter) and infrarenal abdominal aortic aneurysm again noted measuring 3.8 x 3.7 cm. Recommend follow-up ultrasound every 2 years. This recommendation follows ACR consensus guidelines: White Paper of the ACR Incidental Findings Committee II on Vascular Findings. J Am Coll Radiol 2013; 10:789-794. 5. Extensive colonic diverticulosis without evidence of acute diverticulitis at this time. 6. Old granulomatous disease, as above. 7. Additional incidental findings, as above.   MRCP: 1. Intra and extrahepatic biliary ductal dilatation redemonstrated, abruptly terminating at the level of the ampulla where there is subtle soft tissue prominence and slightly increased enhancement only appreciated on delayed imaging. The possibility of an ampullary neoplasm should be considered, and further evaluation with endoscopic ultrasound is recommended if clinically appropriate to exclude neoplasm. 2. In the area of concern in the region of the gallbladder fossa on prior CT examination, no  discrete mass is confidently identified. However, there is some peripheral delayed  enhancement in this region and overlying increased T2 signal intensity in the adjacent soft tissues, fat of the right upper quadrant of the abdomen and the right anterior abdominal wall musculature. These findings are of uncertain etiology and significance, and could simply relate to chronic scarring. However, attention on any routine follow-up imaging is recommended to ensure the stability of these findings as neoplasm is difficult to entirely exclude. 3. Trace bilateral pleural effusions (right greater than left). 4. Colonic diverticulosis. 5. Fusiform aneurysmal dilatation of the infrarenal abdominal aorta measuring 3.8 x 3.3 cm. Recommend follow-up ultrasound every 2 years. This recommendation follows ACR consensus guidelines: White Paper of the ACR Incidental Findings Committee II on Vascular Findings. J Am Coll Radiol 2013; 10:789-794.  EKG:  Not done in ED, will get one.      Review of Systems:   General: has subjective fevers, no chills, no body weight gain, has poor appetite, has fatigue HEENT: no blurry vision, hearing changes or sore throat Respiratory: no dyspnea, coughing, wheezing CV: no chest pain, no palpitations GI: no nausea, vomiting, has abdominal pain, no diarrhea, constipation GU: Has dysuria, burning on urination, no hematuria  Ext: no leg edema Neuro: no unilateral weakness, numbness, or tingling, no vision change or hearing loss Skin: no rash, no skin tear. MSK: No muscle spasm, no deformity, no limitation of range of movement in spin Heme: No easy bruising.  Travel history: No recent long distant travel.   Allergy:  Allergies  Allergen Reactions   Codeine Other (See Comments)    agitation    Past Medical History:  Diagnosis Date   Hearing loss    PVD (peripheral vascular disease) ([place])     Past Surgical History:  Procedure Laterality Date   ABDOMINAL  HYSTERECTOMY     CATARACT EXTRACTION, BILATERAL  2007   CHOLECYSTECTOMY OPEN  1961   IR ERCP [place] REM STENTS BIL PAN DUCT INC DIL EA STENT [place] Right 2003   leg stents   ORIF FEMUR FRACTURE Right 2014    Social History:  reports that she quit smoking about 20 years ago. Her smoking use included cigarettes. She has a 25.00 pack-year smoking history. She has never used smokeless tobacco. She reports that she does not drink alcohol and does not use drugs.  Family History:  Family History  Problem Relation Age of Onset   Breast cancer Mother    Heart disease Father      Prior to Admission medications   Medication Sig Start Date End Date Taking? Authorizing Provider  acetaminophen (TYLENOL) 500 MG tablet Take 1,000 mg by mouth every 6 (six) hours as needed for mild pain.    [provider]  albuterol (VENTOLIN HFA) 108 (90 Base) MCG/ACT inhaler Inhale 2 puffs into the lungs every 6 (six) hours as needed for wheezing or shortness of breath. 12/07/19   [provider]  amLODipine (NORVASC) 5 MG tablet Take 1 tablet (5 mg total) by mouth daily. 11/18/20 10/11/21  Atway, Jeananne Rama, DO  aspirin EC 81 MG tablet Take 81 mg by mouth daily. Swallow whole.    [provider]  atorvastatin (LIPITOR) 20 MG tablet Take 20 mg by mouth at bedtime. 08/07/20   [provider]  clopidogrel (PLAVIX) 75 MG tablet TAKE 1 TABLET BY MOUTH EVERY DAY Patient taking differently: Take 75 mg by mouth daily. 09/04/20   Delsa Grana, PA-C  docusate sodium (COLACE) 100 MG capsule Take 100 mg by mouth daily. 06/02/21   [provider]  isosorbide mononitrate (IMDUR) 30 MG 24 hr tablet Take 1 tablet (30 mg total) by mouth daily. 06/24/21 07/24/21  Vanessa Stryker, MD  magnesium oxide (MAG-OX) 400 MG tablet Take 1 tablet by mouth daily. 03/09/21   [provider]  metoprolol tartrate (LOPRESSOR) 25 MG tablet Take 1 tablet (25 mg total) by mouth 2 (two) times daily. 06/24/21 07/24/21  Vanessa Causey, MD  nitroGLYCERIN (NITROSTAT) 0.4 MG SL tablet Place 1 tablet (0.4 mg total) under the tongue as needed for chest pain. 11/17/20 12/17/20  Atway, Jeananne Rama, DO  Omega-3 Fatty Acids (FISH OIL) 1000 MG CAPS Take 1,000 mg by mouth daily. Patient not taking: Reported on 06/24/2021 08/07/20   [provider]  pantoprazole (PROTONIX) 40 MG tablet TAKE 1 TABLET BY MOUTH EVERY DAY 12/10/19   Delsa Grana, PA-C    Physical Exam: Vitals:   04/25/2022 0730 04/05/2022 0800 04/01/2022 0830 04/22/2022 1218  BP: (!) 117/54 (!) 102/58 (!) 119/58 (!) 149/70  Pulse: 72 73 68 76  Resp:    17  Temp:    98 F (36.7 C)  TempSrc:    Oral  SpO2: 97% 96% 97% 97%  Weight:      Height:       General: Not in acute distress HEENT:       Eyes: PERRL, EOMI, no scleral icterus.       ENT: No discharge from the ears and nose, no pharynx injection, no tonsillar enlargement.        Neck: No JVD, no bruit, no mass felt. Heme: No neck lymph node enlargement. Cardiac: S1/S2, RRR, No murmurs, No gallops or rubs. Respiratory: No rales, wheezing, rhonchi or rubs. GI: Soft, nondistended, has tenderness in the right side of abdomen, no rebound pain, no organomegaly, BS present. GU: has positive right CVA tenderness Ext: No pitting leg edema bilaterally. 1+DP/PT pulse bilaterally. Musculoskeletal: No joint deformities, No joint redness or warmth, no limitation of ROM in spin. Skin: No rashes.  Neuro: Alert, oriented X3, cranial nerves II-XII grossly intact, moves all extremities normally. Psych: Patient is not psychotic, no suicidal or hemocidal ideation.  Labs on Admission: I have personally reviewed following labs and imaging studies  CBC: Recent Labs  Lab 04/17/2022 0230  WBC 13.5*  NEUTROABS 9.5*  HGB 15.7*  HCT 50.1*  MCV 80.9  PLT 123456*   Basic Metabolic Panel: Recent Labs  Lab 04/09/2022 0230  NA 136  K 3.6  CL 99  CO2 26  GLUCOSE 116*  BUN 17  CREATININE 1.08*  CALCIUM 9.5   GFR: Estimated  Creatinine Clearance: 32.7 mL/min (A) (by C-G formula based on SCr of 1.08 mg/dL (H)). Liver Function Tests: Recent Labs  Lab 04/12/2022 0230  AST 16  ALT 12  ALKPHOS 86  BILITOT 1.8*  PROT 8.0  ALBUMIN 3.9   Recent Labs  Lab 04/19/2022 0230  LIPASE 48   No results for input(s): "AMMONIA" in the last 168 hours. Coagulation Profile: Recent Labs  Lab 04/28/2022 0836  INR 1.3*   Cardiac Enzymes: No results for input(s): "CKTOTAL", "CKMB", "CKMBINDEX", "TROPONINI" in the last 168 hours. BNP (last 3 results) No results for input(s): "PROBNP" in the last 8760 hours. HbA1C: No results for input(s): "HGBA1C" in the last 72 hours. CBG: No results for input(s): "GLUCAP" in the last 168 hours. Lipid Profile: No results for input(s): "CHOL", "HDL", "LDLCALC", "TRIG", "CHOLHDL", "LDLDIRECT" in the last 72 hours. Thyroid Function Tests: No results  for input(s): "TSH", "T4TOTAL", "FREET4", "T3FREE", "THYROIDAB" in the last 72 hours. Anemia Panel: No results for input(s): "VITAMINB12", "FOLATE", "FERRITIN", "TIBC", "IRON", "RETICCTPCT" in the last 72 hours. Urine analysis:    Component Value Date/Time   COLORURINE YELLOW (A) 04/23/2022 0230   APPEARANCEUR HAZY (A) 04/29/2022 0230   LABSPEC 1.016 04/11/2022 0230   PHURINE 5.0 04/07/2022 0230   GLUCOSEU NEGATIVE 04/18/2022 0230   HGBUR NEGATIVE 04/12/2022 0230   BILIRUBINUR NEGATIVE 04/10/2022 0230   BILIRUBINUR Negative 03/17/2018 1349   KETONESUR NEGATIVE 04/27/2022 0230   PROTEINUR 30 (A) 04/12/2022 0230   UROBILINOGEN 0.2 03/17/2018 1349   NITRITE NEGATIVE 04/01/2022 0230   LEUKOCYTESUR MODERATE (A) 04/22/2022 0230   Sepsis Labs: @LABRCNTIP (procalcitonin:4,lacticidven:4) )No results found for this or any previous visit (from the past 240 hour(s)).   Radiological Exams on Admission: MR ABDOMEN MRCP W WO CONTAST  Result Date: 04/19/2022 CLINICAL DATA:  89 year old female with history of biliary dilatation noted on prior CT  examination. Evaluate for cause of biliary tract obstruction. EXAM: MRI ABDOMEN WITHOUT AND WITH CONTRAST (INCLUDING MRCP) TECHNIQUE: Multiplanar multisequence MR imaging of the abdomen was performed both before and after the administration of intravenous contrast. Heavily T2-weighted images of the biliary and pancreatic ducts were obtained, and three-dimensional MRCP images were rendered by post processing. CONTRAST:  7.2mL GADAVIST GADOBUTROL 1 MMOL/ML IV SOLN COMPARISON:  Abdominal MRI 02/25/2018. FINDINGS: Lower chest: Trace bilateral pleural effusions (right greater than left). Hepatobiliary: No definite suspicious cystic or solid hepatic lesions are confidently identified. Status post cholecystectomy. In the area of concern on the recent CT examination in the gallbladder fossa there are 2 tiny foci of apparent diffusion restriction best appreciated on axial images 88-89 of series 11 and 2021 of series 12. This area appears rather unremarkable on many of the pre and postcontrast pulse sequences, however, there does appear to be some peripheral enhancement to this region on delayed postcontrast images, best appreciated on axial images 42-44 of series 24. There is also some adjacent T2 hyperintensity in the fat of the right upper quadrant, and in the overlying right abdominal wall musculature. No enhancement in the overlying musculature on post gadolinium imaging is appreciated, however. MRCP images demonstrate moderate to severe intrahepatic biliary ductal dilatation, along with severe common bile duct dilatation which measures up to 1.7 cm in diameter in the porta hepatis, abruptly terminating at the level of the ampulla. No filling defects in the common bile duct to suggest choledocholithiasis. At the level of the ampulla there is some very mild soft tissue prominence associated with slightly increased enhancement best appreciated on axial image 60 of series 24 and coronal image 38 of series 26), without  discrete mass or nodule confidently identified. Pancreas: Possible ampullary lesion, as discussed above. No other solid pancreatic mass identified. No pancreatic ductal dilatation. No pancreatic or peripancreatic fluid collections or inflammatory changes. Spleen:  Unremarkable. Adrenals/Urinary Tract: Bilateral kidneys and adrenal glands are normal in appearance. No hydroureteronephrosis in the visualized portions of the abdomen. Stomach/Bowel: Numerous colonic diverticuli are noted. Otherwise, unremarkable. Vascular/Lymphatic: Aortic atherosclerosis with fusiform aneurysmal dilatation of the infrarenal abdominal aorta which measures up to 3.8 x 3.3 cm. No definite lymphadenopathy noted in the abdomen or pelvis. Other:  No significant volume of ascites. Musculoskeletal: No aggressive appearing osseous lesions are noted in the visualized portions of the skeleton. IMPRESSION: 1. Intra and extrahepatic biliary ductal dilatation redemonstrated, abruptly terminating at the level of the ampulla where there is subtle soft tissue prominence  and slightly increased enhancement only appreciated on delayed imaging. The possibility of an ampullary neoplasm should be considered, and further evaluation with endoscopic ultrasound is recommended if clinically appropriate to exclude neoplasm. 2. In the area of concern in the region of the gallbladder fossa on prior CT examination, no discrete mass is confidently identified. However, there is some peripheral delayed enhancement in this region and overlying increased T2 signal intensity in the adjacent soft tissues, fat of the right upper quadrant of the abdomen and the right anterior abdominal wall musculature. These findings are of uncertain etiology and significance, and could simply relate to chronic scarring. However, attention on any routine follow-up imaging is recommended to ensure the stability of these findings as neoplasm is difficult to entirely exclude. 3. Trace bilateral  pleural effusions (right greater than left). 4. Colonic diverticulosis. 5. Fusiform aneurysmal dilatation of the infrarenal abdominal aorta measuring 3.8 x 3.3 cm. Recommend follow-up ultrasound every 2 years. This recommendation follows ACR consensus guidelines: White Paper of the ACR Incidental Findings Committee II on Vascular Findings. J Am Coll Radiol 2013; 10:789-794. Electronically Signed   By: Vinnie Langton M.D.   On: 04/16/2022 12:34   MR 3D Recon At Scanner  Result Date: 04/20/2022 CLINICAL DATA:  89 year old female with history of biliary dilatation noted on prior CT examination. Evaluate for cause of biliary tract obstruction. EXAM: MRI ABDOMEN WITHOUT AND WITH CONTRAST (INCLUDING MRCP) TECHNIQUE: Multiplanar multisequence MR imaging of the abdomen was performed both before and after the administration of intravenous contrast. Heavily T2-weighted images of the biliary and pancreatic ducts were obtained, and three-dimensional MRCP images were rendered by post processing. CONTRAST:  7.82mL GADAVIST GADOBUTROL 1 MMOL/ML IV SOLN COMPARISON:  Abdominal MRI 02/25/2018. FINDINGS: Lower chest: Trace bilateral pleural effusions (right greater than left). Hepatobiliary: No definite suspicious cystic or solid hepatic lesions are confidently identified. Status post cholecystectomy. In the area of concern on the recent CT examination in the gallbladder fossa there are 2 tiny foci of apparent diffusion restriction best appreciated on axial images 88-89 of series 11 and 2021 of series 12. This area appears rather unremarkable on many of the pre and postcontrast pulse sequences, however, there does appear to be some peripheral enhancement to this region on delayed postcontrast images, best appreciated on axial images 42-44 of series 24. There is also some adjacent T2 hyperintensity in the fat of the right upper quadrant, and in the overlying right abdominal wall musculature. No enhancement in the overlying  musculature on post gadolinium imaging is appreciated, however. MRCP images demonstrate moderate to severe intrahepatic biliary ductal dilatation, along with severe common bile duct dilatation which measures up to 1.7 cm in diameter in the porta hepatis, abruptly terminating at the level of the ampulla. No filling defects in the common bile duct to suggest choledocholithiasis. At the level of the ampulla there is some very mild soft tissue prominence associated with slightly increased enhancement best appreciated on axial image 60 of series 24 and coronal image 38 of series 26), without discrete mass or nodule confidently identified. Pancreas: Possible ampullary lesion, as discussed above. No other solid pancreatic mass identified. No pancreatic ductal dilatation. No pancreatic or peripancreatic fluid collections or inflammatory changes. Spleen:  Unremarkable. Adrenals/Urinary Tract: Bilateral kidneys and adrenal glands are normal in appearance. No hydroureteronephrosis in the visualized portions of the abdomen. Stomach/Bowel: Numerous colonic diverticuli are noted. Otherwise, unremarkable. Vascular/Lymphatic: Aortic atherosclerosis with fusiform aneurysmal dilatation of the infrarenal abdominal aorta which measures up to 3.8 x  3.3 cm. No definite lymphadenopathy noted in the abdomen or pelvis. Other:  No significant volume of ascites. Musculoskeletal: No aggressive appearing osseous lesions are noted in the visualized portions of the skeleton. IMPRESSION: 1. Intra and extrahepatic biliary ductal dilatation redemonstrated, abruptly terminating at the level of the ampulla where there is subtle soft tissue prominence and slightly increased enhancement only appreciated on delayed imaging. The possibility of an ampullary neoplasm should be considered, and further evaluation with endoscopic ultrasound is recommended if clinically appropriate to exclude neoplasm. 2. In the area of concern in the region of the gallbladder  fossa on prior CT examination, no discrete mass is confidently identified. However, there is some peripheral delayed enhancement in this region and overlying increased T2 signal intensity in the adjacent soft tissues, fat of the right upper quadrant of the abdomen and the right anterior abdominal wall musculature. These findings are of uncertain etiology and significance, and could simply relate to chronic scarring. However, attention on any routine follow-up imaging is recommended to ensure the stability of these findings as neoplasm is difficult to entirely exclude. 3. Trace bilateral pleural effusions (right greater than left). 4. Colonic diverticulosis. 5. Fusiform aneurysmal dilatation of the infrarenal abdominal aorta measuring 3.8 x 3.3 cm. Recommend follow-up ultrasound every 2 years. This recommendation follows ACR consensus guidelines: White Paper of the ACR Incidental Findings Committee II on Vascular Findings. J Am Coll Radiol 2013; 10:789-794. Electronically Signed   By: Trudie Reed M.D.   On: 04/14/2022 12:34   CT ABDOMEN PELVIS W CONTRAST  Result Date: 04/07/2022 CLINICAL DATA:  89 year old female with history of acute onset of nonlocalized abdominal pain. EXAM: CT ABDOMEN AND PELVIS WITH CONTRAST TECHNIQUE: Multidetector CT imaging of the abdomen and pelvis was performed using the standard protocol following bolus administration of intravenous contrast. RADIATION DOSE REDUCTION: This exam was performed according to the departmental dose-optimization program which includes automated exposure control, adjustment of the mA and/or kV according to patient size and/or use of iterative reconstruction technique. CONTRAST:  46mL OMNIPAQUE IOHEXOL 300 MG/ML  SOLN COMPARISON:  CT of the abdomen and pelvis 04/11/2022. FINDINGS: Lower chest: Large calcified granuloma in the base of the right lower lobe incidentally noted. Extensive atherosclerosis in the descending thoracic aorta, as well as the left  main, left anterior descending, left circumflex and right coronary arteries. Calcifications of the aortic valve and mitral annulus. Hepatobiliary: Patient is status post cholecystectomy. Severe intra and extrahepatic biliary ductal dilatation, with the common bile duct measuring up to 1.6 cm in diameter in the porta hepatis. The common bile duct abruptly terminates in an area of soft tissue prominence shortly before the level of the ampulla, best appreciated on coronal image 43 of series 5. No definite suspicious cystic or solid hepatic lesions are noted. However, in the region of the gallbladder fossa (axial image 33 of series 2) there is a focal area of amorphous soft tissue prominence measuring 1.5 x 1.3 cm. Pancreas: In the region of the pancreatic head, best appreciated on coronal image 43 of series 5 and sagittal image 45 of series 6 there is subtle soft tissue prominence with minimally increased enhancement, without a discrete measurable mass, associated with abrupt termination of the common bile duct shortly before the level of the ampulla. No other potential pancreatic mass identified. Pancreatic duct is not dilated. No peripancreatic fluid collections or inflammatory changes. Spleen: Innumerable small calcified granulomas noted throughout the spleen. Adrenals/Urinary Tract: Vascular calcifications in the left renal hilum. Bilateral kidneys  and adrenal glands are otherwise normal in appearance. No hydroureteronephrosis. Urinary bladder is nearly completely decompressed, but otherwise unremarkable in appearance. Stomach/Bowel: The appearance of the stomach is normal. There is no pathologic dilatation of small bowel or colon. Numerous colonic diverticuli are noted, particularly in the descending colon and sigmoid colon, without surrounding inflammatory changes to indicate an acute diverticulitis at this time. The appendix is not confidently identified and may be surgically absent. Regardless, there are no  inflammatory changes noted adjacent to the cecum to suggest the presence of an acute appendicitis at this time. Vascular/Lymphatic: Aortic atherosclerosis with fusiform aneurysmal dilatation of the infrarenal abdominal aorta which measures up to 3.8 x 3.7 cm in diameter. Small amount of eccentric nonenhancing material within the aneurysm sac is compatible with a small amount of mural thrombus. There is also aneurysmal dilatation of the right common iliac artery (1.8 cm in diameter). No definite lymphadenopathy noted in the abdomen or pelvis. Reproductive: Status post hysterectomy. Ovaries are not confidently identified and may be surgically absent or atrophic. Other: No significant volume of ascites.  No pneumoperitoneum. Musculoskeletal: Postoperative changes of ORIF in the right hip are incidentally noted. There are no aggressive appearing lytic or blastic lesions noted in the visualized portions of the skeleton. IMPRESSION: 1. Severe intra and extrahepatic biliary ductal dilatation which appears slightly increased compared to prior examinations. In this patient with history of prior cholecystectomy, the appearance on prior studies likely reflective of benign post cholecystectomy physiology, however, today's study demonstrates subtle soft tissue prominence and potential enhancement in the region of the ampulla, raising concern for potential ampullary neoplasm. Further evaluation with abdominal MRI with and without IV gadolinium with MRCP should be considered to better evaluate this finding. 2. No other acute findings are noted elsewhere in the abdomen or pelvis. 3. Subtle soft tissue prominence in the region of the gallbladder fossa. This is of uncertain etiology and significance, but attention should be directed to this region at time of any future abdominal MR imaging to exclude neoplasm. 4. Aortic atherosclerosis, in addition to left main and three-vessel coronary artery disease, with aneurysmal dilatation of  the right common iliac artery (1.8 cm in diameter) and infrarenal abdominal aortic aneurysm again noted measuring 3.8 x 3.7 cm. Recommend follow-up ultrasound every 2 years. This recommendation follows ACR consensus guidelines: White Paper of the ACR Incidental Findings Committee II on Vascular Findings. J Am Coll Radiol 2013; 10:789-794. 5. Extensive colonic diverticulosis without evidence of acute diverticulitis at this time. 6. Old granulomatous disease, as above. 7. Additional incidental findings, as above. Electronically Signed   By: Vinnie Langton M.D.   On: 04/07/2022 06:44   CT Renal Stone Study  Result Date: 04/25/2022 CLINICAL DATA:  Right upper and lower abdominal and flank pain. Known AAA. EXAM: CT ABDOMEN AND PELVIS WITHOUT CONTRAST TECHNIQUE: Multidetector CT imaging of the abdomen and pelvis was performed following the standard protocol without IV contrast. RADIATION DOSE REDUCTION: This exam was performed according to the departmental dose-optimization program which includes automated exposure control, adjustment of the mA and/or kV according to patient size and/or use of iterative reconstruction technique. COMPARISON:  CTA chest, abdomen and pelvis 06/24/2021 FINDINGS: Lower chest: There is a calcified granuloma in the right lower lobe. Lung bases show scattered scar-like opacities. The cardiac size is normal. There are coronary artery calcifications, scattered plaque in the mitral ring. No pericardial effusion. Hepatobiliary: Post cholecystectomy dilatation of the common bile duct is similar measuring 1.6 cm. There is no  calcified ductal stone. Stable intrahepatic biliary prominence. There are scattered calcified granulomas in the liver. No mass is seen without contrast. Pancreas: Unremarkable without contrast apart from mild atrophy. Spleen: Numerous calcified granulomas.  No other focal abnormality. Adrenals/Urinary Tract: No focal abnormality in the unenhanced adrenal glands and renal  cortex. There are renovascular calcifications at both renal hila without evidence of urinary stones or obstruction. The bladder is contracted and not well seen due to artifact from right hip nailing hardware. Stomach/Bowel: No dilatation or wall thickening. An appendix is not seen in this patient. There are diffuse colonic diverticula. The diverticulosis is progressively advanced along the more distal left colon than elsewhere. There are no acute inflammatory changes. Vascular/Lymphatic: Extensive vascular calcifications of the aorta and branches. Stable 3.8 x 3.7 cm infrarenal AAA. The common iliac arteries remain aneurysmal measuring 1.8 cm on the right and 1.6 cm on the left. No lymphadenopathy is seen. Reproductive: Status post hysterectomy. No adnexal masses. Other: There is no free air, free hemorrhage or free fluid. There is an umbilical fat hernia and mild umbilical rectus diastasis. There is no incarcerated hernia. Musculoskeletal: There is osteopenia and advanced degenerative change of the spine. Old right hip nailing and mild hip DJD. No suspicious bone lesions. IMPRESSION: 1. No acute noncontrast CT abnormality is seen. 2. Extensive diverticulosis without evidence of diverticulitis. 3. Stable 3.8 x 3.7 cm infrarenal AAA. Recommend follow-up every 2 years. Reference: J Am Coll Radiol 7829;56:213-086. 4. Umbilical fat hernia and mild umbilical rectus diastasis. No incarcerated hernia. 5. Chronic postcholecystectomy biliary dilatation, unchanged. 6. Aortic and coronary artery atherosclerosis. 7. Osteopenia, postsurgical and degenerative change. Aortic Atherosclerosis (ICD10-I70.0). Electronically Signed   By: Telford Nab M.D.   On: 04/14/22 03:35      Assessment/Plan Principal Problem:   Acute pyelonephritis Active Problems:   Sepsis ([place])   Stage 3 chronic kidney disease (HCC)   HTN (hypertension)   Hyperlipidemia   CAD (coronary artery disease)   PVD (peripheral vascular disease) (HCC)    Obesity (BMI 30.0-34.9)   Assessment and Plan:  Sepsis due to  acute pyelonephritis: pt meets criteria for sepsis with WBC 13.5, heart rate 95.  Pending lactic acid level   - Admit to tele bed as an inpatient - Meropenem by IV (has history of ESBL) - Follow up results of urine and blood cx and amend antibiotic regimen if needed per sensitivity results - prn Zofran for nausea - will get Procalcitonin and trend lactic acid levels per sepsis protocol. - IVF: 1L of NS bolus in ED, followed by 75 cc/h  Stage 3 chronic kidney disease ([place]): -f/u by BMP  HTN (hypertension): -Hold amlodipine, metoprolol, and Imdur since patient is at high risk of developing hypotension due to sepsis -IV hydralazine as needed  Hyperlipidemia -Lipitor  CAD (coronary artery disease) -Aspirin, Lipitor -as needed nitroglycerin  PVD (peripheral vascular disease) ([place]) -Hold Plavix in case patient need procedure -on ASA and lipitor  Obesity (BMI 30.0-34.9): BMI 30.11, body weight 77.1 kg -Exercise and healthy diet -encourage losing weight  Abnormal findings on CT scan: CT-abd/pelvis with contrast showed severe intra and extrahepatic biliary ductal dilatation, need to r/o ampullary neoplasm. CT scan also showed subtle soft tissue prominence in the region of the gallbladder fossa.  LFT normal except for total bilirubin 1.8.  Lipase 48. MRCP findings are not very confirmative about possibly ampullary neoplasm.  - I consulted Dr. Haig Prophet of GI. Per Dr. Haig Prophet, pt will need outpatient work up as  no one here does EUS. Advanced endoscopist from [place] comes every other week. Dr. Haig Prophet kindly agreed to give pt referral, which is really appreciated. Dr. Haig Prophet also plans to do EGD tomorrow since "Can sometime see the ampulla on an EGD".  -pt also will need to follow up the abnormal findings of "subtle soft tissue prominence in the region of the gallbladder fossa" and repeat CT or MRI as outpt to ensure the  stability of these findings as neoplasm is difficult to entirely exclude.     DVT ppx: SQ Heparin  Code Status: DNR per pt and her son  Family Communication:  Yes, patient's son  by phone  Disposition Plan:  Anticipate discharge back to previous environment  Consults called:  none  Admission status and Level of care: Telemetry Medical:   as inpt     Dispo: The patient is from: Home              Anticipated d/c is to: Home              Anticipated d/c date is: 2 days              Patient currently is not medically stable to d/c.    Severity of Illness:  The appropriate patient status for this patient is INPATIENT. Inpatient status is judged to be reasonable and necessary in order to provide the required intensity of service to ensure the patient's safety. The patient's presenting symptoms, physical exam findings, and initial radiographic and laboratory data in the context of their chronic comorbidities is felt to place them at high risk for further clinical deterioration. Furthermore, it is not anticipated that the patient will be medically stable for discharge from the hospital within 2 midnights of admission.   * I certify that at the point of admission it is my clinical judgment that the patient will require inpatient hospital care spanning beyond 2 midnights from the point of admission due to high intensity of service, high risk for further deterioration and high frequency of surveillance required.*       Date of Service 04/30/2022    Ivor [facility]   If 7PM-7AM, please contact night-coverage [email or web address] 04/22/2022, 1:15 PM

## 2022-04-13 NOTE — Consult Note (Signed)
PHARMACY NOTE:  ANTIMICROBIAL RENAL DOSAGE ADJUSTMENT  Current antimicrobial regimen includes a mismatch between antimicrobial dosage and estimated renal function.  As per policy approved by the Pharmacy & Therapeutics and Medical Executive Committees, the antimicrobial dosage will be adjusted accordingly.  Current antimicrobial dosage: Meropenem 1g IV Q8H  Indication: UTI (history of ESBL E. coli in urine cultures 03/2018)  Renal Function: Estimated Creatinine Clearance: 32.7 mL/min (A) (by C-G formula based on SCr of 1.08 mg/dL (H)).    Antimicrobial dosage has been changed to:  Meropenem 1g Q12H   Thank you for allowing pharmacy to be a part of this patient's care.  Gretel Acre, PharmD PGY1 Pharmacy Resident 04/16/2022 7:18 AM

## 2022-04-14 DIAGNOSIS — E871 Hypo-osmolality and hyponatremia: Secondary | ICD-10-CM | POA: Insufficient documentation

## 2022-04-14 DIAGNOSIS — N10 Acute tubulo-interstitial nephritis: Secondary | ICD-10-CM | POA: Diagnosis not present

## 2022-04-14 DIAGNOSIS — K838 Other specified diseases of biliary tract: Secondary | ICD-10-CM

## 2022-04-14 DIAGNOSIS — D75838 Other thrombocytosis: Secondary | ICD-10-CM | POA: Insufficient documentation

## 2022-04-14 DIAGNOSIS — A419 Sepsis, unspecified organism: Secondary | ICD-10-CM | POA: Diagnosis not present

## 2022-04-14 LAB — CBC
HCT: 41.7 % (ref 36.0–46.0)
Hemoglobin: 13.2 g/dL (ref 12.0–15.0)
MCH: 25.7 pg — ABNORMAL LOW (ref 26.0–34.0)
MCHC: 31.7 g/dL (ref 30.0–36.0)
MCV: 81.1 fL (ref 80.0–100.0)
Platelets: 551 10*3/uL — ABNORMAL HIGH (ref 150–400)
RBC: 5.14 MIL/uL — ABNORMAL HIGH (ref 3.87–5.11)
RDW: 16.2 % — ABNORMAL HIGH (ref 11.5–15.5)
WBC: 11.6 10*3/uL — ABNORMAL HIGH (ref 4.0–10.5)
nRBC: 0 % (ref 0.0–0.2)

## 2022-04-14 LAB — COMPREHENSIVE METABOLIC PANEL
ALT: 34 U/L (ref 0–44)
AST: 37 U/L (ref 15–41)
Albumin: 3 g/dL — ABNORMAL LOW (ref 3.5–5.0)
Alkaline Phosphatase: 114 U/L (ref 38–126)
Anion gap: 10 (ref 5–15)
BUN: 20 mg/dL (ref 8–23)
CO2: 22 mmol/L (ref 22–32)
Calcium: 8.3 mg/dL — ABNORMAL LOW (ref 8.9–10.3)
Chloride: 101 mmol/L (ref 98–111)
Creatinine, Ser: 1.1 mg/dL — ABNORMAL HIGH (ref 0.44–1.00)
GFR, Estimated: 47 mL/min — ABNORMAL LOW (ref >60)
Glucose, Bld: 113 mg/dL — ABNORMAL HIGH (ref 70–99)
Potassium: 3.6 mmol/L (ref 3.5–5.1)
Sodium: 133 mmol/L — ABNORMAL LOW (ref 135–145)
Total Bilirubin: 2.2 mg/dL — ABNORMAL HIGH (ref 0.3–1.2)
Total Protein: 6.5 g/dL (ref 6.5–8.1)

## 2022-04-14 MED ORDER — LACTULOSE 10 GM/15ML PO SOLN
20.0000 g | Freq: Once | ORAL | Status: AC
  Administered 2022-04-14: 20 g via ORAL
  Filled 2022-04-14: qty 30

## 2022-04-14 MED ORDER — SENNOSIDES-DOCUSATE SODIUM 8.6-50 MG PO TABS
2.0000 | ORAL_TABLET | Freq: Two times a day (BID) | ORAL | Status: DC
  Administered 2022-04-14: 2 via ORAL
  Filled 2022-04-14: qty 2

## 2022-04-14 NOTE — Hospital Course (Signed)
Kristen Dudley is a 89 y.o. female with medical history significant of hypertension, hyperlipidemia, PVD, CAD, CKD-3A, AAA, carotid artery stenosis, kidney stone, hard of hearing, who presents with abdominal pain, dysuria and burning urination.  Urine study abnormal, patient was treated with meropenem for pyelonephritis. CT scan also showed dilated biliary tract, seen by GI, planning for EGD. Patient condition was improving, culture grew Klebsiella and E. coli, susceptible to Rocephin.  Blood culture negative.  She was scheduled for EGD today. However, patient passed in her sleep this morning, time of death 8:17 AM.  Due to DO NOT RESUSCITATE status, no effort was made to resuscitation.  Daughter at the bedside when patient passed.

## 2022-04-14 NOTE — Consult Note (Signed)
Consultation  Referring Provider:    Hospitalist  Admit date: 04/14/2022 Consult date:  04/14/2022 Reason for Consultation: Abnormal imaging              HPI:   Kristen Dudley is a 89 y.o. lady with history of CVA, PVD, and hypertension who presented with a vague abdominal pain since Thursday with possible mental status changes so an ambulance brought her to the hospital. She was diagnosed with a UTI but on CT imaging and then subsequently MRI/MRCP it was noted that she had intra and extra hepatic ductal dilation with possible ampullary lesion. She has had similar dilation since 2019 on previous imaging. She has a history of cholecystectomy in the 1960's. She takes plavix with last dose on Thursday. She is very hard of hearing so history also taken from her son. She note RUQ pain with radiation down her right side. Her surgical history note biliary stents but they do not know any history of this so it's likely an error and meant to be vascular stents for her legs.  Past Medical History:  Diagnosis Date   Hearing loss    PVD (peripheral vascular disease) ([place])     Past Surgical History:  Procedure Laterality Date   ABDOMINAL HYSTERECTOMY     CATARACT EXTRACTION, BILATERAL  2007   CHOLECYSTECTOMY OPEN  1961   IR ERCP EXCH REM STENTS BIL PAN DUCT INC DIL EA STENT EXCH Right 2003   leg stents   ORIF FEMUR FRACTURE Right 2014    Family History  Problem Relation Age of Onset   Breast cancer Mother    Heart disease Father      Social History   Tobacco Use   Smoking status: Former    Packs/day: 0.50    Years: 50.00    Total pack years: 25.00    Types: Cigarettes    Quit date: 05/01/2001    Years since quitting: 20.9   Smokeless tobacco: Never  Vaping Use   Vaping Use: Never used  Substance Use Topics   Alcohol use: Never   Drug use: Never    Prior to Admission medications   Medication Sig Start Date End Date Taking? Authorizing Provider  acetaminophen (TYLENOL) 500 MG tablet  Take 1,000 mg by mouth every 6 (six) hours as needed for mild pain.   Yes [provider]  albuterol (VENTOLIN HFA) 108 (90 Base) MCG/ACT inhaler Inhale 2 puffs into the lungs every 6 (six) hours as needed for wheezing or shortness of breath. 12/07/19  Yes [provider]  amLODipine (NORVASC) 5 MG tablet Take 1 tablet (5 mg total) by mouth daily. 11/18/20 04/21/2022 Yes Atway, Rayann N, DO  aspirin EC 81 MG tablet Take 81 mg by mouth daily. Swallow whole.   Yes [provider]  atorvastatin (LIPITOR) 20 MG tablet Take 20 mg by mouth at bedtime. 08/07/20  Yes [provider]  clopidogrel (PLAVIX) 75 MG tablet TAKE 1 TABLET BY MOUTH EVERY DAY 09/04/20  Yes Delsa Grana, PA-C  docusate sodium (COLACE) 100 MG capsule Take 100 mg by mouth daily. 06/02/21  Yes [provider]  pantoprazole (PROTONIX) 40 MG tablet TAKE 1 TABLET BY MOUTH EVERY DAY 12/10/19  Yes Delsa Grana, PA-C  triamcinolone ointment (KENALOG) 0.1 % Apply 1 Application topically 2 (two) times daily. 10/18/21  Yes [provider]  isosorbide mononitrate (IMDUR) 30 MG 24 hr tablet Take 1 tablet (30 mg total) by mouth daily. 06/24/21 07/24/21  Marjean Donna  E, MD  magnesium oxide (MAG-OX) 400 MG tablet Take 1 tablet by mouth daily. Patient not taking: Reported on 04/20/2022 03/09/21   [provider]  metoprolol tartrate (LOPRESSOR) 25 MG tablet Take 1 tablet (25 mg total) by mouth 2 (two) times daily. 06/24/21 07/24/21  Concha Se, MD  nitroGLYCERIN (NITROSTAT) 0.4 MG SL tablet Place 1 tablet (0.4 mg total) under the tongue as needed for chest pain. 11/17/20 12/17/20  Atway, Derwood Kaplan, DO  Omega-3 Fatty Acids (FISH OIL) 1000 MG CAPS Take 1,000 mg by mouth daily. Patient not taking: Reported on 06/24/2021 08/07/20   [provider]  ondansetron (ZOFRAN) 4 MG tablet Take 4 mg by mouth every 8 (eight) hours as needed. Patient not taking: Reported on 04/10/2022    [provider]     Current Facility-Administered Medications  Medication Dose Route Frequency Provider Last Rate Last Admin   0.9 %  sodium chloride infusion   Intravenous Continuous Lorretta Harp, MD 75 mL/hr at 04/27/2022 1743 Infusion Verify at 04/12/2022 1743   acetaminophen (TYLENOL) tablet 650 mg  650 mg Oral Q6H PRN Mansy, Jan A, MD       Or   acetaminophen (TYLENOL) suppository 650 mg  650 mg Rectal Q6H PRN Mansy, 04-26-22 A, MD       albuterol (PROVENTIL) (2.5 MG/3ML) 0.083% nebulizer solution 3 mL  3 mL Inhalation Q6H PRN Mansy, Jan A, MD       aspirin EC tablet 81 mg  81 mg Oral Daily Mansy, 2022/04/26 A, MD   81 mg at 04/05/2022 1635   atorvastatin (LIPITOR) tablet 20 mg  20 mg Oral QHS Mansy, 04-26-2022 A, MD   20 mg at 04/07/2022 2219   docusate sodium (COLACE) capsule 100 mg  100 mg Oral Daily Mansy, April 26, 2022 A, MD   100 mg at 04/17/2022 1635   fentaNYL (SUBLIMAZE) injection 12.5 mcg  12.5 mcg Intravenous Q3H PRN Lorretta Harp, MD       heparin injection 5,000 Units  5,000 Units Subcutaneous Q8H Lorretta Harp, MD   5,000 Units at 04/14/22 0555   meropenem (MERREM) 1 g in sodium chloride 0.9 % 100 mL IVPB  1 g Intravenous Q12H Coulter, Carolyn, RPH 200 mL/hr at 04/04/2022 2231 1 g at 04/05/2022 2231   nitroGLYCERIN (NITROSTAT) SL tablet 0.4 mg  0.4 mg Sublingual PRN Mansy, 26-Apr-2022 A, MD       ondansetron [facility]) tablet 4 mg  4 mg Oral Q6H PRN Mansy, 04-26-22 A, MD       Or   ondansetron [facility]) injection 4 mg  4 mg Intravenous Q6H PRN Mansy, April 26, 2022 A, MD       oxyCODONE-acetaminophen (PERCOCET/ROXICET) 5-325 MG per tablet 1 tablet  1 tablet Oral Q4H PRN Lorretta Harp, MD   1 tablet at 04/14/22 0555   pantoprazole (PROTONIX) EC tablet 40 mg  40 mg Oral Daily Mansy, 2022-04-26 A, MD   40 mg at 04/27/2022 1635   traZODone (DESYREL) tablet 25 mg  25 mg Oral QHS PRN Mansy, Jan A, MD        Allergies as of 04/17/2022 - Review Complete 04/12/2022  Allergen Reaction Noted   Codeine Other (See Comments) 08/05/2020     Review of Systems:    All systems reviewed  and negative except where noted in HPI.  Review of Systems  Constitutional:  Positive for chills and malaise/fatigue. Negative for fever.  Cardiovascular:  Negative for chest pain.  Gastrointestinal:  Positive for abdominal  pain. Negative for constipation and nausea.  Musculoskeletal:  Positive for joint pain.  Skin:  Negative for rash.  Neurological:  Negative for focal weakness.  Psychiatric/Behavioral:  Negative for substance abuse.   All other systems reviewed and are negative.     Physical Exam:  Vital signs in last 24 hours: Temp:  [97.8 F (36.6 C)-98.4 F (36.9 C)] 98 F (36.7 C) (01/14 0845) Pulse Rate:  [65-83] 65 (01/14 0845) Resp:  [15-27] 18 (01/14 0845) BP: (93-161)/(42-94) 93/57 (01/14 0845) SpO2:  [84 %-100 %] 84 % (01/14 0845) Last BM Date : 04/10/22 General:   Pleasant in NAD Head:  Normocephalic and atraumatic. Eyes:   No icterus.   Conjunctiva pink. Ears:  very hard of hearing Mouth: Mucosa pink moist, no lesions. Neck:  Supple; no masses felt Lungs:  No respiratory distress Abdomen:   Flat, soft, nondistended, mild RUQ tenderness Msk:  No clubbing or cyanosis. Strength 5/5. Symmetrical without gross deformities. Neurologic:  No focal deficits Skin:  Warm, dry, pink without significant lesions or rashes. Psych:  Alert and cooperative. Normal affect.  LAB RESULTS: Recent Labs    04/14/2022 0230 04/14/22 0444  WBC 13.5* 11.6*  HGB 15.7* 13.2  HCT 50.1* 41.7  PLT 677* 551*   BMET Recent Labs    04/04/2022 0230 04/14/22 0444  NA 136 133*  K 3.6 3.6  CL 99 101  CO2 26 22  GLUCOSE 116* 113*  BUN 17 20  CREATININE 1.08* 1.10*  CALCIUM 9.5 8.3*   LFT Recent Labs    04/14/22 0444  PROT 6.5  ALBUMIN 3.0*  AST 37  ALT 34  ALKPHOS 114  BILITOT 2.2*   PT/INR Recent Labs    04/27/2022 0836  LABPROT 15.7*  INR 1.3*    STUDIES: MR ABDOMEN MRCP W WO CONTAST  Result Date: 04/09/2022 CLINICAL DATA:  89 year old female with history of  biliary dilatation noted on prior CT examination. Evaluate for cause of biliary tract obstruction. EXAM: MRI ABDOMEN WITHOUT AND WITH CONTRAST (INCLUDING MRCP) TECHNIQUE: Multiplanar multisequence MR imaging of the abdomen was performed both before and after the administration of intravenous contrast. Heavily T2-weighted images of the biliary and pancreatic ducts were obtained, and three-dimensional MRCP images were rendered by post processing. CONTRAST:  7.55mL GADAVIST GADOBUTROL 1 MMOL/ML IV SOLN COMPARISON:  Abdominal MRI 02/25/2018. FINDINGS: Lower chest: Trace bilateral pleural effusions (right greater than left). Hepatobiliary: No definite suspicious cystic or solid hepatic lesions are confidently identified. Status post cholecystectomy. In the area of concern on the recent CT examination in the gallbladder fossa there are 2 tiny foci of apparent diffusion restriction best appreciated on axial images 88-89 of series 11 and 2021 of series 12. This area appears rather unremarkable on many of the pre and postcontrast pulse sequences, however, there does appear to be some peripheral enhancement to this region on delayed postcontrast images, best appreciated on axial images 42-44 of series 24. There is also some adjacent T2 hyperintensity in the fat of the right upper quadrant, and in the overlying right abdominal wall musculature. No enhancement in the overlying musculature on post gadolinium imaging is appreciated, however. MRCP images demonstrate moderate to severe intrahepatic biliary ductal dilatation, along with severe common bile duct dilatation which measures up to 1.7 cm in diameter in the porta hepatis, abruptly terminating at the level of the ampulla. No filling defects in the common bile duct to suggest choledocholithiasis. At the level of the ampulla there is some very mild  soft tissue prominence associated with slightly increased enhancement best appreciated on axial image 60 of series 24 and coronal  image 38 of series 26), without discrete mass or nodule confidently identified. Pancreas: Possible ampullary lesion, as discussed above. No other solid pancreatic mass identified. No pancreatic ductal dilatation. No pancreatic or peripancreatic fluid collections or inflammatory changes. Spleen:  Unremarkable. Adrenals/Urinary Tract: Bilateral kidneys and adrenal glands are normal in appearance. No hydroureteronephrosis in the visualized portions of the abdomen. Stomach/Bowel: Numerous colonic diverticuli are noted. Otherwise, unremarkable. Vascular/Lymphatic: Aortic atherosclerosis with fusiform aneurysmal dilatation of the infrarenal abdominal aorta which measures up to 3.8 x 3.3 cm. No definite lymphadenopathy noted in the abdomen or pelvis. Other:  No significant volume of ascites. Musculoskeletal: No aggressive appearing osseous lesions are noted in the visualized portions of the skeleton. IMPRESSION: 1. Intra and extrahepatic biliary ductal dilatation redemonstrated, abruptly terminating at the level of the ampulla where there is subtle soft tissue prominence and slightly increased enhancement only appreciated on delayed imaging. The possibility of an ampullary neoplasm should be considered, and further evaluation with endoscopic ultrasound is recommended if clinically appropriate to exclude neoplasm. 2. In the area of concern in the region of the gallbladder fossa on prior CT examination, no discrete mass is confidently identified. However, there is some peripheral delayed enhancement in this region and overlying increased T2 signal intensity in the adjacent soft tissues, fat of the right upper quadrant of the abdomen and the right anterior abdominal wall musculature. These findings are of uncertain etiology and significance, and could simply relate to chronic scarring. However, attention on any routine follow-up imaging is recommended to ensure the stability of these findings as neoplasm is difficult to  entirely exclude. 3. Trace bilateral pleural effusions (right greater than left). 4. Colonic diverticulosis. 5. Fusiform aneurysmal dilatation of the infrarenal abdominal aorta measuring 3.8 x 3.3 cm. Recommend follow-up ultrasound every 2 years. This recommendation follows ACR consensus guidelines: White Paper of the ACR Incidental Findings Committee II on Vascular Findings. J Am Coll Radiol 2013; 10:789-794. Electronically Signed   By: Trudie Reedaniel  Entrikin M.D.   On: 04/11/2022 12:34   MR 3D Recon At Scanner  Result Date: 04/24/2022 CLINICAL DATA:  89 year old female with history of biliary dilatation noted on prior CT examination. Evaluate for cause of biliary tract obstruction. EXAM: MRI ABDOMEN WITHOUT AND WITH CONTRAST (INCLUDING MRCP) TECHNIQUE: Multiplanar multisequence MR imaging of the abdomen was performed both before and after the administration of intravenous contrast. Heavily T2-weighted images of the biliary and pancreatic ducts were obtained, and three-dimensional MRCP images were rendered by post processing. CONTRAST:  7.375mL GADAVIST GADOBUTROL 1 MMOL/ML IV SOLN COMPARISON:  Abdominal MRI 02/25/2018. FINDINGS: Lower chest: Trace bilateral pleural effusions (right greater than left). Hepatobiliary: No definite suspicious cystic or solid hepatic lesions are confidently identified. Status post cholecystectomy. In the area of concern on the recent CT examination in the gallbladder fossa there are 2 tiny foci of apparent diffusion restriction best appreciated on axial images 88-89 of series 11 and 2021 of series 12. This area appears rather unremarkable on many of the pre and postcontrast pulse sequences, however, there does appear to be some peripheral enhancement to this region on delayed postcontrast images, best appreciated on axial images 42-44 of series 24. There is also some adjacent T2 hyperintensity in the fat of the right upper quadrant, and in the overlying right abdominal wall musculature. No  enhancement in the overlying musculature on post gadolinium imaging is appreciated, however. MRCP  images demonstrate moderate to severe intrahepatic biliary ductal dilatation, along with severe common bile duct dilatation which measures up to 1.7 cm in diameter in the porta hepatis, abruptly terminating at the level of the ampulla. No filling defects in the common bile duct to suggest choledocholithiasis. At the level of the ampulla there is some very mild soft tissue prominence associated with slightly increased enhancement best appreciated on axial image 60 of series 24 and coronal image 38 of series 26), without discrete mass or nodule confidently identified. Pancreas: Possible ampullary lesion, as discussed above. No other solid pancreatic mass identified. No pancreatic ductal dilatation. No pancreatic or peripancreatic fluid collections or inflammatory changes. Spleen:  Unremarkable. Adrenals/Urinary Tract: Bilateral kidneys and adrenal glands are normal in appearance. No hydroureteronephrosis in the visualized portions of the abdomen. Stomach/Bowel: Numerous colonic diverticuli are noted. Otherwise, unremarkable. Vascular/Lymphatic: Aortic atherosclerosis with fusiform aneurysmal dilatation of the infrarenal abdominal aorta which measures up to 3.8 x 3.3 cm. No definite lymphadenopathy noted in the abdomen or pelvis. Other:  No significant volume of ascites. Musculoskeletal: No aggressive appearing osseous lesions are noted in the visualized portions of the skeleton. IMPRESSION: 1. Intra and extrahepatic biliary ductal dilatation redemonstrated, abruptly terminating at the level of the ampulla where there is subtle soft tissue prominence and slightly increased enhancement only appreciated on delayed imaging. The possibility of an ampullary neoplasm should be considered, and further evaluation with endoscopic ultrasound is recommended if clinically appropriate to exclude neoplasm. 2. In the area of concern in  the region of the gallbladder fossa on prior CT examination, no discrete mass is confidently identified. However, there is some peripheral delayed enhancement in this region and overlying increased T2 signal intensity in the adjacent soft tissues, fat of the right upper quadrant of the abdomen and the right anterior abdominal wall musculature. These findings are of uncertain etiology and significance, and could simply relate to chronic scarring. However, attention on any routine follow-up imaging is recommended to ensure the stability of these findings as neoplasm is difficult to entirely exclude. 3. Trace bilateral pleural effusions (right greater than left). 4. Colonic diverticulosis. 5. Fusiform aneurysmal dilatation of the infrarenal abdominal aorta measuring 3.8 x 3.3 cm. Recommend follow-up ultrasound every 2 years. This recommendation follows ACR consensus guidelines: White Paper of the ACR Incidental Findings Committee II on Vascular Findings. J Am Coll Radiol 2013; 10:789-794. Electronically Signed   By: Trudie Reed M.D.   On: 04-22-22 12:34   CT ABDOMEN PELVIS W CONTRAST  Result Date: Apr 22, 2022 CLINICAL DATA:  89 year old female with history of acute onset of nonlocalized abdominal pain. EXAM: CT ABDOMEN AND PELVIS WITH CONTRAST TECHNIQUE: Multidetector CT imaging of the abdomen and pelvis was performed using the standard protocol following bolus administration of intravenous contrast. RADIATION DOSE REDUCTION: This exam was performed according to the departmental dose-optimization program which includes automated exposure control, adjustment of the mA and/or kV according to patient size and/or use of iterative reconstruction technique. CONTRAST:  39mL OMNIPAQUE IOHEXOL 300 MG/ML  SOLN COMPARISON:  CT of the abdomen and pelvis 04-22-2022. FINDINGS: Lower chest: Large calcified granuloma in the base of the right lower lobe incidentally noted. Extensive atherosclerosis in the descending thoracic  aorta, as well as the left main, left anterior descending, left circumflex and right coronary arteries. Calcifications of the aortic valve and mitral annulus. Hepatobiliary: Patient is status post cholecystectomy. Severe intra and extrahepatic biliary ductal dilatation, with the common bile duct measuring up to 1.6 cm in diameter in  the porta hepatis. The common bile duct abruptly terminates in an area of soft tissue prominence shortly before the level of the ampulla, best appreciated on coronal image 43 of series 5. No definite suspicious cystic or solid hepatic lesions are noted. However, in the region of the gallbladder fossa (axial image 33 of series 2) there is a focal area of amorphous soft tissue prominence measuring 1.5 x 1.3 cm. Pancreas: In the region of the pancreatic head, best appreciated on coronal image 43 of series 5 and sagittal image 45 of series 6 there is subtle soft tissue prominence with minimally increased enhancement, without a discrete measurable mass, associated with abrupt termination of the common bile duct shortly before the level of the ampulla. No other potential pancreatic mass identified. Pancreatic duct is not dilated. No peripancreatic fluid collections or inflammatory changes. Spleen: Innumerable small calcified granulomas noted throughout the spleen. Adrenals/Urinary Tract: Vascular calcifications in the left renal hilum. Bilateral kidneys and adrenal glands are otherwise normal in appearance. No hydroureteronephrosis. Urinary bladder is nearly completely decompressed, but otherwise unremarkable in appearance. Stomach/Bowel: The appearance of the stomach is normal. There is no pathologic dilatation of small bowel or colon. Numerous colonic diverticuli are noted, particularly in the descending colon and sigmoid colon, without surrounding inflammatory changes to indicate an acute diverticulitis at this time. The appendix is not confidently identified and may be surgically absent.  Regardless, there are no inflammatory changes noted adjacent to the cecum to suggest the presence of an acute appendicitis at this time. Vascular/Lymphatic: Aortic atherosclerosis with fusiform aneurysmal dilatation of the infrarenal abdominal aorta which measures up to 3.8 x 3.7 cm in diameter. Small amount of eccentric nonenhancing material within the aneurysm sac is compatible with a small amount of mural thrombus. There is also aneurysmal dilatation of the right common iliac artery (1.8 cm in diameter). No definite lymphadenopathy noted in the abdomen or pelvis. Reproductive: Status post hysterectomy. Ovaries are not confidently identified and may be surgically absent or atrophic. Other: No significant volume of ascites.  No pneumoperitoneum. Musculoskeletal: Postoperative changes of ORIF in the right hip are incidentally noted. There are no aggressive appearing lytic or blastic lesions noted in the visualized portions of the skeleton. IMPRESSION: 1. Severe intra and extrahepatic biliary ductal dilatation which appears slightly increased compared to prior examinations. In this patient with history of prior cholecystectomy, the appearance on prior studies likely reflective of benign post cholecystectomy physiology, however, today's study demonstrates subtle soft tissue prominence and potential enhancement in the region of the ampulla, raising concern for potential ampullary neoplasm. Further evaluation with abdominal MRI with and without IV gadolinium with MRCP should be considered to better evaluate this finding. 2. No other acute findings are noted elsewhere in the abdomen or pelvis. 3. Subtle soft tissue prominence in the region of the gallbladder fossa. This is of uncertain etiology and significance, but attention should be directed to this region at time of any future abdominal MR imaging to exclude neoplasm. 4. Aortic atherosclerosis, in addition to left main and three-vessel coronary artery disease, with  aneurysmal dilatation of the right common iliac artery (1.8 cm in diameter) and infrarenal abdominal aortic aneurysm again noted measuring 3.8 x 3.7 cm. Recommend follow-up ultrasound every 2 years. This recommendation follows ACR consensus guidelines: White Paper of the ACR Incidental Findings Committee II on Vascular Findings. J Am Coll Radiol 2013; 10:789-794. 5. Extensive colonic diverticulosis without evidence of acute diverticulitis at this time. 6. Old granulomatous disease, as above. 7.  Additional incidental findings, as above. Electronically Signed   By: Trudie Reed M.D.   On: 05/04/22 06:44   CT Renal Stone Study  Result Date: 05/04/22 CLINICAL DATA:  Right upper and lower abdominal and flank pain. Known AAA. EXAM: CT ABDOMEN AND PELVIS WITHOUT CONTRAST TECHNIQUE: Multidetector CT imaging of the abdomen and pelvis was performed following the standard protocol without IV contrast. RADIATION DOSE REDUCTION: This exam was performed according to the departmental dose-optimization program which includes automated exposure control, adjustment of the mA and/or kV according to patient size and/or use of iterative reconstruction technique. COMPARISON:  CTA chest, abdomen and pelvis 06/24/2021 FINDINGS: Lower chest: There is a calcified granuloma in the right lower lobe. Lung bases show scattered scar-like opacities. The cardiac size is normal. There are coronary artery calcifications, scattered plaque in the mitral ring. No pericardial effusion. Hepatobiliary: Post cholecystectomy dilatation of the common bile duct is similar measuring 1.6 cm. There is no calcified ductal stone. Stable intrahepatic biliary prominence. There are scattered calcified granulomas in the liver. No mass is seen without contrast. Pancreas: Unremarkable without contrast apart from mild atrophy. Spleen: Numerous calcified granulomas.  No other focal abnormality. Adrenals/Urinary Tract: No focal abnormality in the unenhanced  adrenal glands and renal cortex. There are renovascular calcifications at both renal hila without evidence of urinary stones or obstruction. The bladder is contracted and not well seen due to artifact from right hip nailing hardware. Stomach/Bowel: No dilatation or wall thickening. An appendix is not seen in this patient. There are diffuse colonic diverticula. The diverticulosis is progressively advanced along the more distal left colon than elsewhere. There are no acute inflammatory changes. Vascular/Lymphatic: Extensive vascular calcifications of the aorta and branches. Stable 3.8 x 3.7 cm infrarenal AAA. The common iliac arteries remain aneurysmal measuring 1.8 cm on the right and 1.6 cm on the left. No lymphadenopathy is seen. Reproductive: Status post hysterectomy. No adnexal masses. Other: There is no free air, free hemorrhage or free fluid. There is an umbilical fat hernia and mild umbilical rectus diastasis. There is no incarcerated hernia. Musculoskeletal: There is osteopenia and advanced degenerative change of the spine. Old right hip nailing and mild hip DJD. No suspicious bone lesions. IMPRESSION: 1. No acute noncontrast CT abnormality is seen. 2. Extensive diverticulosis without evidence of diverticulitis. 3. Stable 3.8 x 3.7 cm infrarenal AAA. Recommend follow-up every 2 years. Reference: J Am Coll Radiol 2013;10:789-794. 4. Umbilical fat hernia and mild umbilical rectus diastasis. No incarcerated hernia. 5. Chronic postcholecystectomy biliary dilatation, unchanged. 6. Aortic and coronary artery atherosclerosis. 7. Osteopenia, postsurgical and degenerative change. Aortic Atherosclerosis (ICD10-I70.0). Electronically Signed   By: Almira Bar M.D.   On: May 04, 2022 03:35       Impression / Plan:   89 y/o  lady with history of CVA, PVD, and hypertension who presented with a vague abdominal pain since Thursday with MRI/MRCP findings similar to 2019 but with possible ampullary lesion. Unfortunately  we have no GI providers that do EUS here but we can look with a forward facing EGD scope and potentially see the ampulla and rule out any large lesion. If unable to get adequate view then would need to consider EUS/ERCP as an outpatient if it's in line with goals of care of family.  - ok for diet today, NPO at midnight - discussed risks/benefits with patient and sone - continue to hold plavix, tomorrow will be 4 days off therapy but we can do biopsies if needed - will check fractionated  bilirubin. She has history of mildly elevated bilirubin that was indirect predominate so she might have Gilbert's  Please call with any questions or concerns.  Kristen Lotrevor Raghav Verrilli MD, MPH [facility]

## 2022-04-14 NOTE — Progress Notes (Signed)
  Progress Note   Patient: Kristen Dudley IOX:[id number] DOB: 1927-05-02 DOA: 04/17/2022     1 DOS: the patient was seen and examined on 04/14/2022   Brief hospital course: VYCTORIA Dudley is a 89 y.o. female with medical history significant of hypertension, hyperlipidemia, PVD, CAD, CKD-3A, AAA, carotid artery stenosis, kidney stone, hard of hearing, who presents with abdominal pain, dysuria and burning urination.  Urine study abnormal, patient was treated with meropenem for pyelonephritis. CT scan also showed dilated biliary tract, seen by GI, planning for EGD.  Assessment and Plan: Sepsis secondary to pyelonephritis Polynephritis. Patient initially Met sepsis criteria for heart rate and leukocytosis.  No elevation of lactic acid level. Patient is hemodynamically stable, will continue current antibiotics.  Urine culture came back with gram-negative rods, blood culture already pending.  Patient has a risk of ESBL bacteria, currently treated with meropenem.  Biliary dilation. Cannot rule out malignancy, bilirubin not significant elevated.  No evidence of biliary infection.  GI has seen the patient, planning for EGD tomorrow and biopsy.  Chronic kidney disease stage IIIa. Hyponatremia. Renal function still stable.  Essential hypertension. Coronary artery disease. Obesity BMI 30.11 Condition stable.     Subjective:  Patient doing well today, denies any abdominal pain or nausea vomiting.  No bowel movement since last Wednesday, feel constipated.  Lactulose given.  Physical Exam: Vitals:   04/20/2022 1537 04/27/2022 2206 04/14/22 0449 04/14/22 0845  BP: (!) 106/94 (!) 107/42 (!) 103/56 (!) 93/57  Pulse: 81 75 69 65  Resp: 16 16 16 18   Temp: 98.3 F (36.8 C) 98.4 F (36.9 C) 97.8 F (36.6 C) 98 F (36.7 C)  TempSrc:      SpO2: 97% 94% 96% (!) 84%  Weight:      Height:       General exam: Appears calm and comfortable  Respiratory system: Clear to auscultation. Respiratory effort  normal. Cardiovascular system: S1 & S2 heard, RRR. No JVD, murmurs, rubs, gallops or clicks. No pedal edema. Gastrointestinal system: Abdomen is nondistended, soft and mild ruq tender. No organomegaly or masses felt. Normal bowel sounds heard. Central nervous system: Alert and oriented x2. No focal neurological deficits. Extremities: Symmetric 5 x 5 power. Skin: No rashes, lesions or ulcers Psychiatry: Mood & affect appropriate.   Data Reviewed:  Reviewed CT scan results, MRCP results, lab results.  Family Communication: Not able to reach son and listed relative  Disposition: Status is: Inpatient Remains inpatient appropriate because: Artery disease, IV treatment.  Planned Discharge Destination: Home with Home Health    Time spent: 35 minutes  Author: Sharen Hones, MD 04/14/2022 11:11 AM  For on call review [email or web address].

## 2022-04-15 ENCOUNTER — Encounter: Admission: EM | Disposition: E | Payer: Self-pay | Source: Home / Self Care | Attending: Internal Medicine

## 2022-04-15 DIAGNOSIS — K838 Other specified diseases of biliary tract: Secondary | ICD-10-CM | POA: Diagnosis not present

## 2022-04-15 DIAGNOSIS — N10 Acute tubulo-interstitial nephritis: Secondary | ICD-10-CM | POA: Diagnosis not present

## 2022-04-15 DIAGNOSIS — A419 Sepsis, unspecified organism: Secondary | ICD-10-CM | POA: Diagnosis not present

## 2022-04-15 DIAGNOSIS — E876 Hypokalemia: Secondary | ICD-10-CM | POA: Insufficient documentation

## 2022-04-15 LAB — URINE CULTURE: Culture: 50000 — AB

## 2022-04-15 LAB — CBC
HCT: 40.8 % (ref 36.0–46.0)
Hemoglobin: 13 g/dL (ref 12.0–15.0)
MCH: 25.6 pg — ABNORMAL LOW (ref 26.0–34.0)
MCHC: 31.9 g/dL (ref 30.0–36.0)
MCV: 80.5 fL (ref 80.0–100.0)
Platelets: 600 10*3/uL — ABNORMAL HIGH (ref 150–400)
RBC: 5.07 MIL/uL (ref 3.87–5.11)
RDW: 16.5 % — ABNORMAL HIGH (ref 11.5–15.5)
WBC: 10.9 10*3/uL — ABNORMAL HIGH (ref 4.0–10.5)
nRBC: 0 % (ref 0.0–0.2)

## 2022-04-15 LAB — MAGNESIUM: Magnesium: 1.9 mg/dL (ref 1.7–2.4)

## 2022-04-15 LAB — COMPREHENSIVE METABOLIC PANEL
ALT: 30 U/L (ref 0–44)
AST: 32 U/L (ref 15–41)
Albumin: 3 g/dL — ABNORMAL LOW (ref 3.5–5.0)
Alkaline Phosphatase: 141 U/L — ABNORMAL HIGH (ref 38–126)
Anion gap: 9 (ref 5–15)
BUN: 21 mg/dL (ref 8–23)
CO2: 24 mmol/L (ref 22–32)
Calcium: 8.5 mg/dL — ABNORMAL LOW (ref 8.9–10.3)
Chloride: 103 mmol/L (ref 98–111)
Creatinine, Ser: 1.12 mg/dL — ABNORMAL HIGH (ref 0.44–1.00)
GFR, Estimated: 46 mL/min — ABNORMAL LOW (ref >60)
Glucose, Bld: 93 mg/dL (ref 70–99)
Potassium: 3.4 mmol/L — ABNORMAL LOW (ref 3.5–5.1)
Sodium: 136 mmol/L (ref 135–145)
Total Bilirubin: 1.8 mg/dL — ABNORMAL HIGH (ref 0.3–1.2)
Total Protein: 6.5 g/dL (ref 6.5–8.1)

## 2022-04-15 SURGERY — ESOPHAGOGASTRODUODENOSCOPY (EGD) WITH PROPOFOL
Anesthesia: Monitor Anesthesia Care

## 2022-04-15 MED ORDER — POTASSIUM CHLORIDE 10 MEQ/100ML IV SOLN: 10.0000 meq | Freq: Once | INTRAVENOUS | Status: DC

## 2022-04-18 LAB — CULTURE, BLOOD (ROUTINE X 2)
Culture: NO GROWTH
Culture: NO GROWTH
Special Requests: ADEQUATE

## 2022-05-02 DIAGNOSIS — 419620001 Death: Secondary | SNOMED CT | POA: Diagnosis not present

## 2022-05-02 NOTE — Death Summary Note (Signed)
DEATH SUMMARY   Patient Details  Name: Kristen Dudley MRN: [id number] DOB: Nov 09, 1927 PCP:Pcp, No Admission/Discharge Information   Admit Date:  05/02/22  Date of Death: Date of Death: 05-04-2022  Time of Death: Time of Death: 0817  Length of Stay: 2   Principle Cause of death: Sudden death with advanced age.  Hospital Diagnoses: Principal Problem:   Acute pyelonephritis Active Problems:   Sepsis (HCC)   Chronic kidney disease, stage 3a (HCC)   HTN (hypertension)   Hyperlipidemia   CAD (coronary artery disease)   PVD (peripheral vascular disease) (HCC)   Obesity (BMI 30.0-34.9)   Hyponatremia   Reactive thrombocytosis   Dilation of biliary tract   Hypokalemia   Sudden death   Hospital Course: Kristen Dudley is a 89 y.o. female with medical history significant of hypertension, hyperlipidemia, PVD, CAD, CKD-3A, AAA, carotid artery stenosis, kidney stone, hard of hearing, who presents with abdominal pain, dysuria and burning urination.  Urine study abnormal, patient was treated with meropenem for pyelonephritis. CT scan also showed dilated biliary tract, seen by GI, planning for EGD. Patient condition was improving, culture grew Klebsiella and E. coli, susceptible to Rocephin.  Blood culture negative.  She was scheduled for EGD today. However, patient passed in her sleep this morning, time of death 8:17 AM.  Due to DO NOT RESUSCITATE status, no effort was made to resuscitation.  Daughter at the bedside when patient passed.  Assessment and Plan: Sepsis secondary to pyelonephritis Pyelonephritis secondary to E. coli and Klebsiella. Patient initially Met sepsis criteria for heart rate and leukocytosis.  No elevation of lactic acid level. Treated with meropenem, based on urine culture results, the treatment is effective.  Blood culture negative.   Biliary dilation. Cannot rule out malignancy, bilirubin not significant elevated.  No evidence of biliary infection.  GI has seen  the patient, planning for EGD and biopsy.   Chronic kidney disease stage IIIa. Hyponatremia. Renal function  stable.   Essential hypertension. Coronary artery disease. Obesity BMI 30.11 Condition stable.       Procedures: None  Consultations: GI  The results of significant diagnostics from this hospitalization (including imaging, microbiology, ancillary and laboratory) are listed below for reference.   Significant Diagnostic Studies: MR ABDOMEN MRCP W WO CONTAST  Result Date: 2022/05/02 CLINICAL DATA:  89 year old female with history of biliary dilatation noted on prior CT examination. Evaluate for cause of biliary tract obstruction. EXAM: MRI ABDOMEN WITHOUT AND WITH CONTRAST (INCLUDING MRCP) TECHNIQUE: Multiplanar multisequence MR imaging of the abdomen was performed both before and after the administration of intravenous contrast. Heavily T2-weighted images of the biliary and pancreatic ducts were obtained, and three-dimensional MRCP images were rendered by post processing. CONTRAST:  7.50mL GADAVIST GADOBUTROL 1 MMOL/ML IV SOLN COMPARISON:  Abdominal MRI 02/25/2018. FINDINGS: Lower chest: Trace bilateral pleural effusions (right greater than left). Hepatobiliary: No definite suspicious cystic or solid hepatic lesions are confidently identified. Status post cholecystectomy. In the area of concern on the recent CT examination in the gallbladder fossa there are 2 tiny foci of apparent diffusion restriction best appreciated on axial images 88-89 of series 11 and 07/26/19 of series 12. This area appears rather unremarkable on many of the pre and postcontrast pulse sequences, however, there does appear to be some peripheral enhancement to this region on delayed postcontrast images, best appreciated on axial images 42-44 of series 24. There is also some adjacent T2 hyperintensity in the fat of the right upper quadrant, and in the  overlying right abdominal wall musculature. No enhancement in the  overlying musculature on post gadolinium imaging is appreciated, however. MRCP images demonstrate moderate to severe intrahepatic biliary ductal dilatation, along with severe common bile duct dilatation which measures up to 1.7 cm in diameter in the porta hepatis, abruptly terminating at the level of the ampulla. No filling defects in the common bile duct to suggest choledocholithiasis. At the level of the ampulla there is some very mild soft tissue prominence associated with slightly increased enhancement best appreciated on axial image 60 of series 24 and coronal image 38 of series 26), without discrete mass or nodule confidently identified. Pancreas: Possible ampullary lesion, as discussed above. No other solid pancreatic mass identified. No pancreatic ductal dilatation. No pancreatic or peripancreatic fluid collections or inflammatory changes. Spleen:  Unremarkable. Adrenals/Urinary Tract: Bilateral kidneys and adrenal glands are normal in appearance. No hydroureteronephrosis in the visualized portions of the abdomen. Stomach/Bowel: Numerous colonic diverticuli are noted. Otherwise, unremarkable. Vascular/Lymphatic: Aortic atherosclerosis with fusiform aneurysmal dilatation of the infrarenal abdominal aorta which measures up to 3.8 x 3.3 cm. No definite lymphadenopathy noted in the abdomen or pelvis. Other:  No significant volume of ascites. Musculoskeletal: No aggressive appearing osseous lesions are noted in the visualized portions of the skeleton. IMPRESSION: 1. Intra and extrahepatic biliary ductal dilatation redemonstrated, abruptly terminating at the level of the ampulla where there is subtle soft tissue prominence and slightly increased enhancement only appreciated on delayed imaging. The possibility of an ampullary neoplasm should be considered, and further evaluation with endoscopic ultrasound is recommended if clinically appropriate to exclude neoplasm. 2. In the area of concern in the region of the  gallbladder fossa on prior CT examination, no discrete mass is confidently identified. However, there is some peripheral delayed enhancement in this region and overlying increased T2 signal intensity in the adjacent soft tissues, fat of the right upper quadrant of the abdomen and the right anterior abdominal wall musculature. These findings are of uncertain etiology and significance, and could simply relate to chronic scarring. However, attention on any routine follow-up imaging is recommended to ensure the stability of these findings as neoplasm is difficult to entirely exclude. 3. Trace bilateral pleural effusions (right greater than left). 4. Colonic diverticulosis. 5. Fusiform aneurysmal dilatation of the infrarenal abdominal aorta measuring 3.8 x 3.3 cm. Recommend follow-up ultrasound every 2 years. This recommendation follows ACR consensus guidelines: White Paper of the ACR Incidental Findings Committee II on Vascular Findings. J Am Coll Radiol 2013; 10:789-794. Electronically Signed   By: Trudie Reed M.D.   On: 04/10/2022 12:34   MR 3D Recon At Scanner  Result Date: 04/27/2022 CLINICAL DATA:  89 year old female with history of biliary dilatation noted on prior CT examination. Evaluate for cause of biliary tract obstruction. EXAM: MRI ABDOMEN WITHOUT AND WITH CONTRAST (INCLUDING MRCP) TECHNIQUE: Multiplanar multisequence MR imaging of the abdomen was performed both before and after the administration of intravenous contrast. Heavily T2-weighted images of the biliary and pancreatic ducts were obtained, and three-dimensional MRCP images were rendered by post processing. CONTRAST:  7.26mL GADAVIST GADOBUTROL 1 MMOL/ML IV SOLN COMPARISON:  Abdominal MRI 02/25/2018. FINDINGS: Lower chest: Trace bilateral pleural effusions (right greater than left). Hepatobiliary: No definite suspicious cystic or solid hepatic lesions are confidently identified. Status post cholecystectomy. In the area of concern on the  recent CT examination in the gallbladder fossa there are 2 tiny foci of apparent diffusion restriction best appreciated on axial images 88-89 of series 11 and 2021 of series  12. This area appears rather unremarkable on many of the pre and postcontrast pulse sequences, however, there does appear to be some peripheral enhancement to this region on delayed postcontrast images, best appreciated on axial images 42-44 of series 24. There is also some adjacent T2 hyperintensity in the fat of the right upper quadrant, and in the overlying right abdominal wall musculature. No enhancement in the overlying musculature on post gadolinium imaging is appreciated, however. MRCP images demonstrate moderate to severe intrahepatic biliary ductal dilatation, along with severe common bile duct dilatation which measures up to 1.7 cm in diameter in the porta hepatis, abruptly terminating at the level of the ampulla. No filling defects in the common bile duct to suggest choledocholithiasis. At the level of the ampulla there is some very mild soft tissue prominence associated with slightly increased enhancement best appreciated on axial image 60 of series 24 and coronal image 38 of series 26), without discrete mass or nodule confidently identified. Pancreas: Possible ampullary lesion, as discussed above. No other solid pancreatic mass identified. No pancreatic ductal dilatation. No pancreatic or peripancreatic fluid collections or inflammatory changes. Spleen:  Unremarkable. Adrenals/Urinary Tract: Bilateral kidneys and adrenal glands are normal in appearance. No hydroureteronephrosis in the visualized portions of the abdomen. Stomach/Bowel: Numerous colonic diverticuli are noted. Otherwise, unremarkable. Vascular/Lymphatic: Aortic atherosclerosis with fusiform aneurysmal dilatation of the infrarenal abdominal aorta which measures up to 3.8 x 3.3 cm. No definite lymphadenopathy noted in the abdomen or pelvis. Other:  No significant volume  of ascites. Musculoskeletal: No aggressive appearing osseous lesions are noted in the visualized portions of the skeleton. IMPRESSION: 1. Intra and extrahepatic biliary ductal dilatation redemonstrated, abruptly terminating at the level of the ampulla where there is subtle soft tissue prominence and slightly increased enhancement only appreciated on delayed imaging. The possibility of an ampullary neoplasm should be considered, and further evaluation with endoscopic ultrasound is recommended if clinically appropriate to exclude neoplasm. 2. In the area of concern in the region of the gallbladder fossa on prior CT examination, no discrete mass is confidently identified. However, there is some peripheral delayed enhancement in this region and overlying increased T2 signal intensity in the adjacent soft tissues, fat of the right upper quadrant of the abdomen and the right anterior abdominal wall musculature. These findings are of uncertain etiology and significance, and could simply relate to chronic scarring. However, attention on any routine follow-up imaging is recommended to ensure the stability of these findings as neoplasm is difficult to entirely exclude. 3. Trace bilateral pleural effusions (right greater than left). 4. Colonic diverticulosis. 5. Fusiform aneurysmal dilatation of the infrarenal abdominal aorta measuring 3.8 x 3.3 cm. Recommend follow-up ultrasound every 2 years. This recommendation follows ACR consensus guidelines: White Paper of the ACR Incidental Findings Committee II on Vascular Findings. J Am Coll Radiol 2013; 10:789-794. Electronically Signed   By: Vinnie Langton M.D.   On: 04/18/2022 12:34   CT ABDOMEN PELVIS W CONTRAST  Result Date: 04/16/2022 CLINICAL DATA:  89 year old female with history of acute onset of nonlocalized abdominal pain. EXAM: CT ABDOMEN AND PELVIS WITH CONTRAST TECHNIQUE: Multidetector CT imaging of the abdomen and pelvis was performed using the standard protocol  following bolus administration of intravenous contrast. RADIATION DOSE REDUCTION: This exam was performed according to the departmental dose-optimization program which includes automated exposure control, adjustment of the mA and/or kV according to patient size and/or use of iterative reconstruction technique. CONTRAST:  46mL OMNIPAQUE IOHEXOL 300 MG/ML  SOLN COMPARISON:  CT of  the abdomen and pelvis 04/14/2022. FINDINGS: Lower chest: Large calcified granuloma in the base of the right lower lobe incidentally noted. Extensive atherosclerosis in the descending thoracic aorta, as well as the left main, left anterior descending, left circumflex and right coronary arteries. Calcifications of the aortic valve and mitral annulus. Hepatobiliary: Patient is status post cholecystectomy. Severe intra and extrahepatic biliary ductal dilatation, with the common bile duct measuring up to 1.6 cm in diameter in the porta hepatis. The common bile duct abruptly terminates in an area of soft tissue prominence shortly before the level of the ampulla, best appreciated on coronal image 43 of series 5. No definite suspicious cystic or solid hepatic lesions are noted. However, in the region of the gallbladder fossa (axial image 33 of series 2) there is a focal area of amorphous soft tissue prominence measuring 1.5 x 1.3 cm. Pancreas: In the region of the pancreatic head, best appreciated on coronal image 43 of series 5 and sagittal image 45 of series 6 there is subtle soft tissue prominence with minimally increased enhancement, without a discrete measurable mass, associated with abrupt termination of the common bile duct shortly before the level of the ampulla. No other potential pancreatic mass identified. Pancreatic duct is not dilated. No peripancreatic fluid collections or inflammatory changes. Spleen: Innumerable small calcified granulomas noted throughout the spleen. Adrenals/Urinary Tract: Vascular calcifications in the left renal  hilum. Bilateral kidneys and adrenal glands are otherwise normal in appearance. No hydroureteronephrosis. Urinary bladder is nearly completely decompressed, but otherwise unremarkable in appearance. Stomach/Bowel: The appearance of the stomach is normal. There is no pathologic dilatation of small bowel or colon. Numerous colonic diverticuli are noted, particularly in the descending colon and sigmoid colon, without surrounding inflammatory changes to indicate an acute diverticulitis at this time. The appendix is not confidently identified and may be surgically absent. Regardless, there are no inflammatory changes noted adjacent to the cecum to suggest the presence of an acute appendicitis at this time. Vascular/Lymphatic: Aortic atherosclerosis with fusiform aneurysmal dilatation of the infrarenal abdominal aorta which measures up to 3.8 x 3.7 cm in diameter. Small amount of eccentric nonenhancing material within the aneurysm sac is compatible with a small amount of mural thrombus. There is also aneurysmal dilatation of the right common iliac artery (1.8 cm in diameter). No definite lymphadenopathy noted in the abdomen or pelvis. Reproductive: Status post hysterectomy. Ovaries are not confidently identified and may be surgically absent or atrophic. Other: No significant volume of ascites.  No pneumoperitoneum. Musculoskeletal: Postoperative changes of ORIF in the right hip are incidentally noted. There are no aggressive appearing lytic or blastic lesions noted in the visualized portions of the skeleton. IMPRESSION: 1. Severe intra and extrahepatic biliary ductal dilatation which appears slightly increased compared to prior examinations. In this patient with history of prior cholecystectomy, the appearance on prior studies likely reflective of benign post cholecystectomy physiology, however, today's study demonstrates subtle soft tissue prominence and potential enhancement in the region of the ampulla, raising concern  for potential ampullary neoplasm. Further evaluation with abdominal MRI with and without IV gadolinium with MRCP should be considered to better evaluate this finding. 2. No other acute findings are noted elsewhere in the abdomen or pelvis. 3. Subtle soft tissue prominence in the region of the gallbladder fossa. This is of uncertain etiology and significance, but attention should be directed to this region at time of any future abdominal MR imaging to exclude neoplasm. 4. Aortic atherosclerosis, in addition to left main and three-vessel  coronary artery disease, with aneurysmal dilatation of the right common iliac artery (1.8 cm in diameter) and infrarenal abdominal aortic aneurysm again noted measuring 3.8 x 3.7 cm. Recommend follow-up ultrasound every 2 years. This recommendation follows ACR consensus guidelines: White Paper of the ACR Incidental Findings Committee II on Vascular Findings. J Am Coll Radiol 2013; 10:789-794. 5. Extensive colonic diverticulosis without evidence of acute diverticulitis at this time. 6. Old granulomatous disease, as above. 7. Additional incidental findings, as above. Electronically Signed   By: Trudie Reedaniel  Entrikin M.D.   On: March 12, 2023 06:44   CT Renal Stone Study  Result Date: 2023-02-16 CLINICAL DATA:  Right upper and lower abdominal and flank pain. Known AAA. EXAM: CT ABDOMEN AND PELVIS WITHOUT CONTRAST TECHNIQUE: Multidetector CT imaging of the abdomen and pelvis was performed following the standard protocol without IV contrast. RADIATION DOSE REDUCTION: This exam was performed according to the departmental dose-optimization program which includes automated exposure control, adjustment of the mA and/or kV according to patient size and/or use of iterative reconstruction technique. COMPARISON:  CTA chest, abdomen and pelvis 06/24/2021 FINDINGS: Lower chest: There is a calcified granuloma in the right lower lobe. Lung bases show scattered scar-like opacities. The cardiac size is  normal. There are coronary artery calcifications, scattered plaque in the mitral ring. No pericardial effusion. Hepatobiliary: Post cholecystectomy dilatation of the common bile duct is similar measuring 1.6 cm. There is no calcified ductal stone. Stable intrahepatic biliary prominence. There are scattered calcified granulomas in the liver. No mass is seen without contrast. Pancreas: Unremarkable without contrast apart from mild atrophy. Spleen: Numerous calcified granulomas.  No other focal abnormality. Adrenals/Urinary Tract: No focal abnormality in the unenhanced adrenal glands and renal cortex. There are renovascular calcifications at both renal hila without evidence of urinary stones or obstruction. The bladder is contracted and not well seen due to artifact from right hip nailing hardware. Stomach/Bowel: No dilatation or wall thickening. An appendix is not seen in this patient. There are diffuse colonic diverticula. The diverticulosis is progressively advanced along the more distal left colon than elsewhere. There are no acute inflammatory changes. Vascular/Lymphatic: Extensive vascular calcifications of the aorta and branches. Stable 3.8 x 3.7 cm infrarenal AAA. The common iliac arteries remain aneurysmal measuring 1.8 cm on the right and 1.6 cm on the left. No lymphadenopathy is seen. Reproductive: Status post hysterectomy. No adnexal masses. Other: There is no free air, free hemorrhage or free fluid. There is an umbilical fat hernia and mild umbilical rectus diastasis. There is no incarcerated hernia. Musculoskeletal: There is osteopenia and advanced degenerative change of the spine. Old right hip nailing and mild hip DJD. No suspicious bone lesions. IMPRESSION: 1. No acute noncontrast CT abnormality is seen. 2. Extensive diverticulosis without evidence of diverticulitis. 3. Stable 3.8 x 3.7 cm infrarenal AAA. Recommend follow-up every 2 years. Reference: J Am Coll Radiol 2013;10:789-794. 4. Umbilical fat  hernia and mild umbilical rectus diastasis. No incarcerated hernia. 5. Chronic postcholecystectomy biliary dilatation, unchanged. 6. Aortic and coronary artery atherosclerosis. 7. Osteopenia, postsurgical and degenerative change. Aortic Atherosclerosis (ICD10-I70.0). Electronically Signed   By: Almira BarKeith  Chesser M.D.   On: March 12, 2023 03:35    Microbiology: Recent Results (from the past 240 hour(s))  Urine Culture     Status: Abnormal   Collection Time: 05/23/22  2:56 AM   Specimen: Urine, Clean Catch  Result Value Ref Range Status   Specimen Description   Final    URINE, CLEAN CATCH Performed at [facility], [address], [place], [place] [address]    Special Requests   Final    NONE Performed at [facility], [address] [place], [place] [address]    Culture (A)  Final    50,000 COLONIES/mL KLEBSIELLA PNEUMONIAE 80,000 COLONIES/mL ESCHERICHIA COLI Confirmed Extended Spectrum Beta-Lactamase Producer (ESBL).  In bloodstream infections from ESBL organisms, carbapenems are preferred over piperacillin/tazobactam. They are shown to have a lower risk of mortality.    Report Status 13-May-2022 FINAL  Final   Organism ID, Bacteria KLEBSIELLA PNEUMONIAE (A)  Final   Organism ID, Bacteria ESCHERICHIA COLI (A)  Final      Susceptibility   Escherichia coli - MIC*    AMPICILLIN >=32 RESISTANT Resistant     CEFAZOLIN >=64 RESISTANT Resistant     CEFEPIME 16 RESISTANT Resistant     CEFTRIAXONE >=64 RESISTANT Resistant     CIPROFLOXACIN >=4 RESISTANT Resistant     GENTAMICIN >=16 RESISTANT Resistant     IMIPENEM <=0.25 SENSITIVE Sensitive     NITROFURANTOIN <=16 SENSITIVE Sensitive     TRIMETH/SULFA <=20 SENSITIVE Sensitive     AMPICILLIN/SULBACTAM >=32 RESISTANT Resistant     PIP/TAZO 8 SENSITIVE Sensitive     * 80,000 COLONIES/mL ESCHERICHIA COLI   Klebsiella pneumoniae - MIC*    AMPICILLIN >=32 RESISTANT Resistant     CEFEPIME <=0.12 SENSITIVE Sensitive      CIPROFLOXACIN <=0.25 SENSITIVE Sensitive     GENTAMICIN <=1 SENSITIVE Sensitive     IMIPENEM <=0.25 SENSITIVE Sensitive     NITROFURANTOIN 64 INTERMEDIATE Intermediate     TRIMETH/SULFA <=20 SENSITIVE Sensitive     AMPICILLIN/SULBACTAM 4 SENSITIVE Sensitive     PIP/TAZO <=4 SENSITIVE Sensitive     * 50,000 COLONIES/mL KLEBSIELLA PNEUMONIAE  Culture, blood (x 2)     Status: None (Preliminary result)   Collection Time: 04/07/2022  8:36 AM   Specimen: BLOOD  Result Value Ref Range Status   Specimen Description BLOOD LEFT AC  Final   Special Requests   Final    BOTTLES DRAWN AEROBIC AND ANAEROBIC Blood Culture adequate volume   Culture   Final    NO GROWTH 2 DAYS Performed at [facility], [address] [place], [place] [address]    Report Status PENDING  Incomplete  Culture, blood (x 2)     Status: None (Preliminary result)   Collection Time: 04/09/2022  8:36 AM   Specimen: BLOOD  Result Value Ref Range Status   Specimen Description BLOOD RIGHT AC  Final   Special Requests   Final    BOTTLES DRAWN AEROBIC AND ANAEROBIC Blood Culture results may not be optimal due to an excessive volume of blood received in culture bottles   Culture   Final    NO GROWTH 2 DAYS Performed at [facility], [address], [place], [place] [address]    Report Status PENDING  Incomplete    Time spent: 50 minutes  Signed: Marrion Coy, MD 05-13-2022

## 2022-05-02 NOTE — Progress Notes (Signed)
Report received from [place], [place]. Upon entering room at 7:15 am patient sleeping, no distress noted, daughter in law in room. Attending rounded and notified nurse at 08:20 that patient had passed with family in room.

## 2022-05-02 DEATH — deceased

## 2022-07-12 ENCOUNTER — Ambulatory Visit: Payer: Medicare Other | Admitting: Podiatry

## 2022-07-24 IMAGING — CT CT HEAD W/O CM
3 series · 14 of 47 positions shown, 16 images · non-contrast
Comparison: February 25, 2018.

CLINICAL DATA: Speech difficulty.

EXAM:
CT HEAD WITHOUT CONTRAST
TECHNIQUE: Contiguous axial images were obtained from the base of the skull
through the vertex without intravenous contrast.

[Series 3: head 5.0 h30s · axial · 0.48mm/px · z∈[-109,+16]mm · 8 of 31 slices shown, 10 images]
[im 3/31  brain]
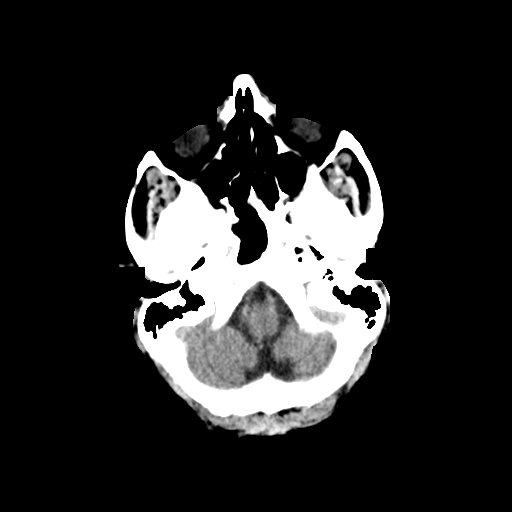
[im 3/31  bone]
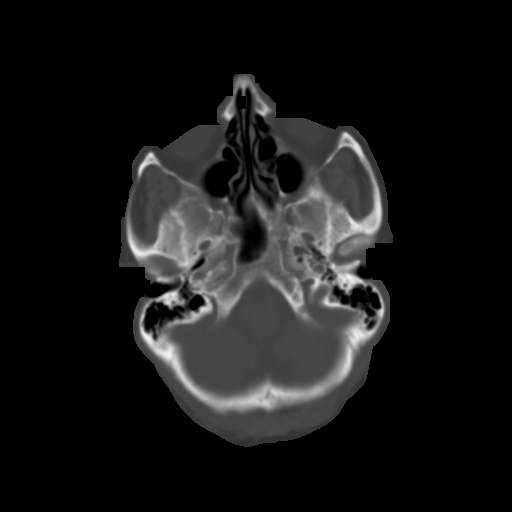
[im 7/31  brain]
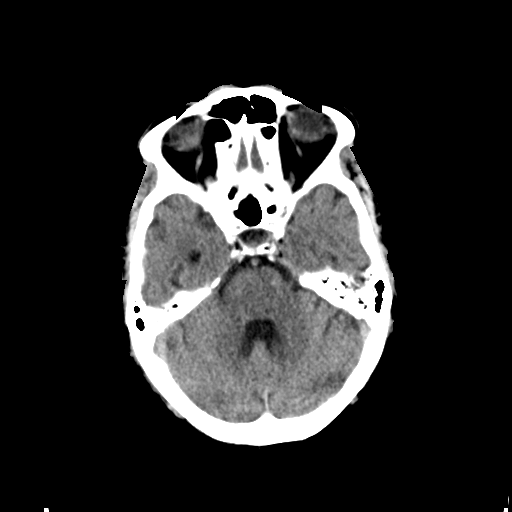
[im 10/31  brain]
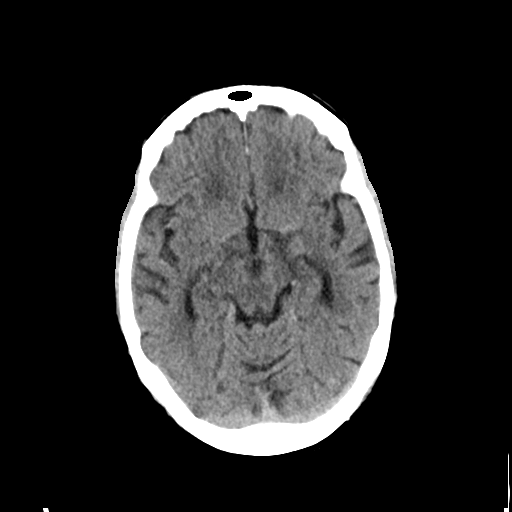
[im 14/31  brain]
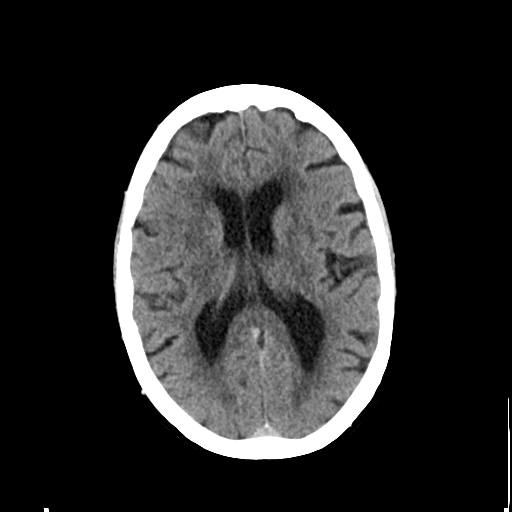
[im 17/31  brain]
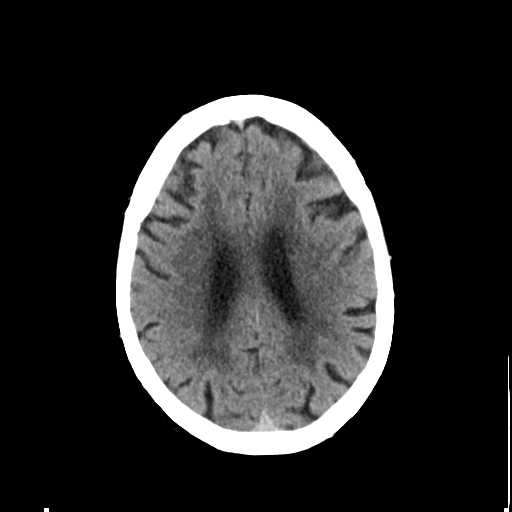
[im 17/31  bone]
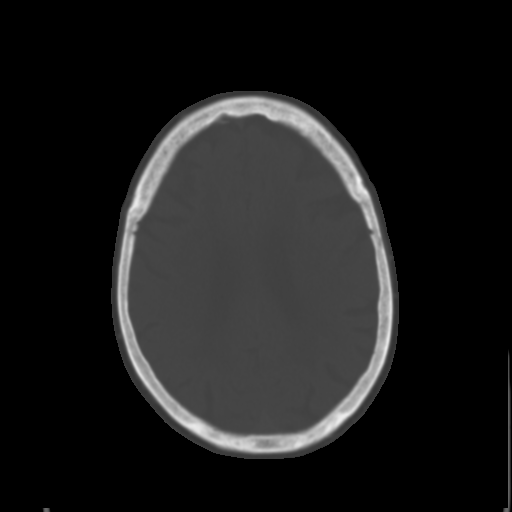
[im 21/31  brain]
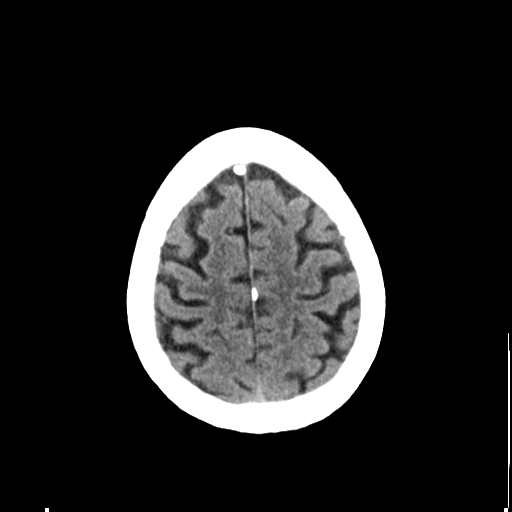
[im 24/31  brain]
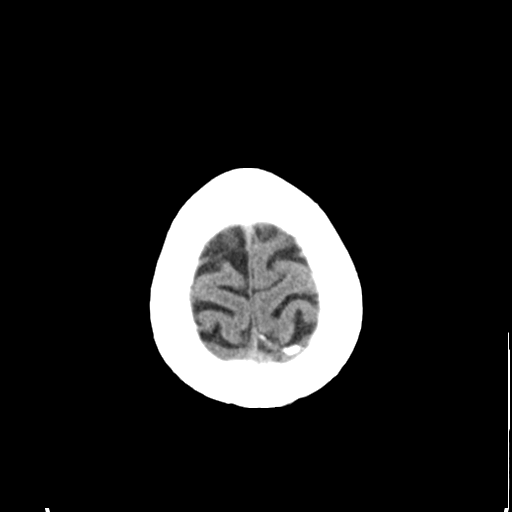
[im 28/31  brain]
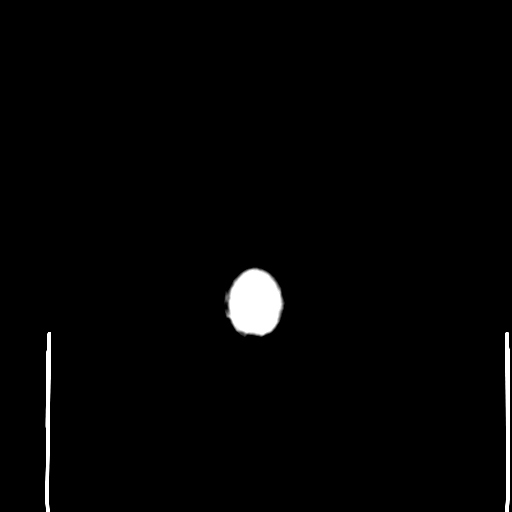

[Series 5: head 3.0 mpr cor · coronal · 0.31mm/px · 3 of 73 slices shown]
[im 25/73  brain]
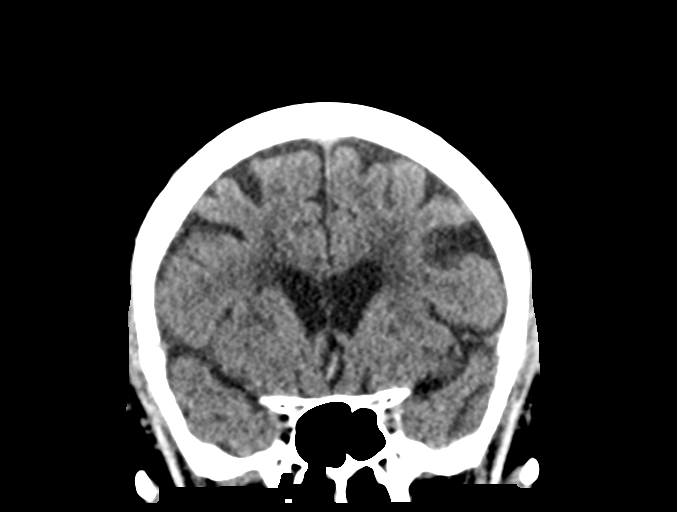
[im 33/73  brain]
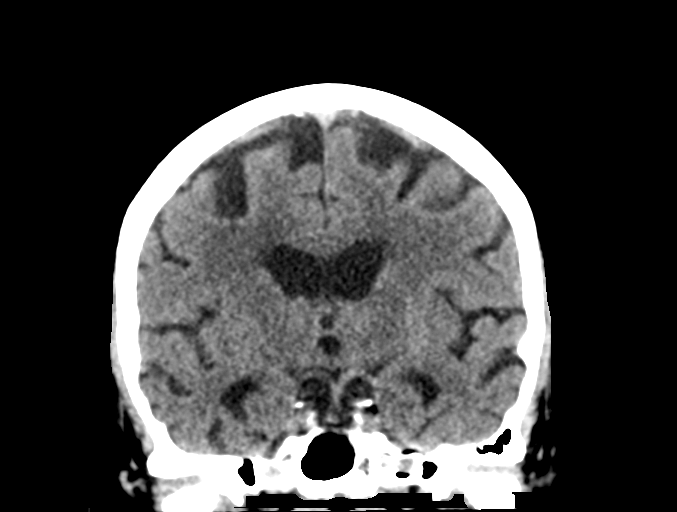
[im 41/73  brain]
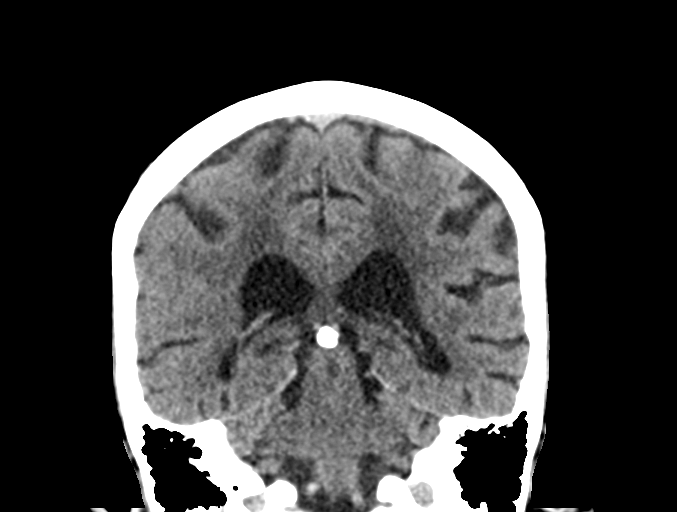

[Series 6: head 3.0 mpr sag · sagittal · 0.30mm/px · 3 of 66 slices shown]
[im 22/66  brain]
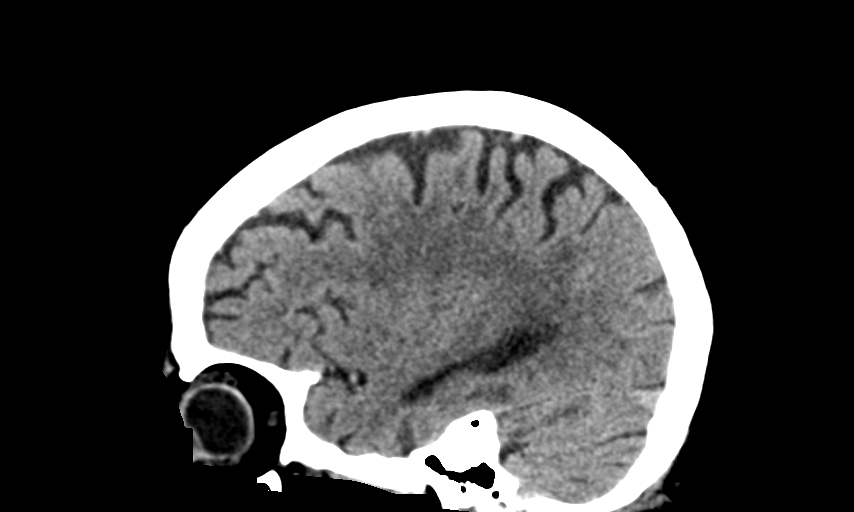
[im 33/66  brain]
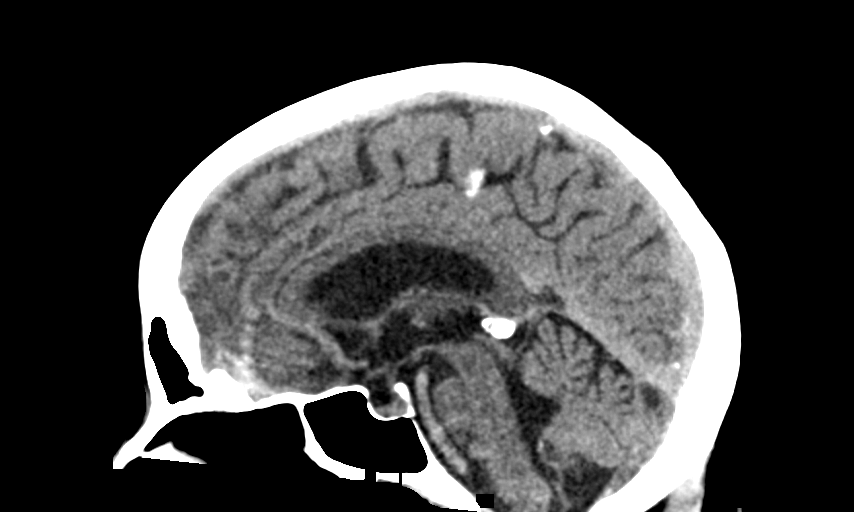
[im 44/66  brain]
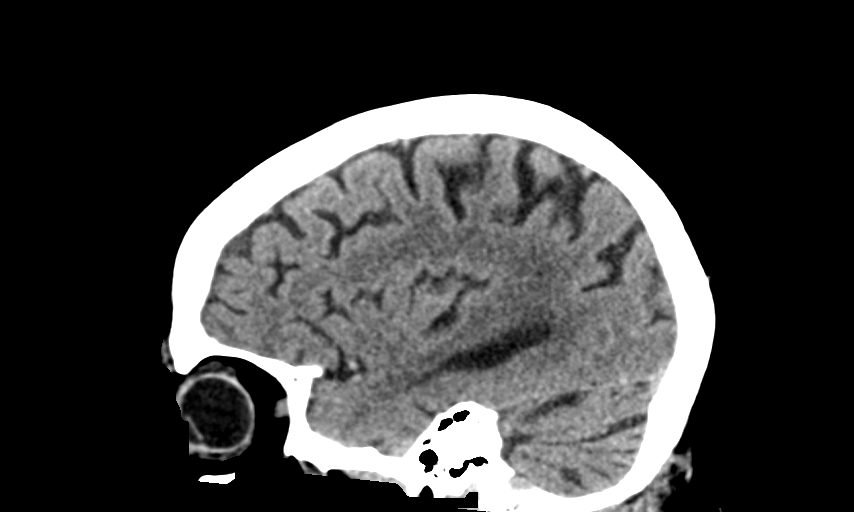

[14 of 47 positions shown; findings below may reference images not displayed]

FINDINGS: Brain: Mild chronic ischemic white matter disease is noted. No mass
effect or midline shift is noted. Ventricular size is within normal
limits. There is no evidence of mass lesion, hemorrhage or acute
infarction.

Vascular: No hyperdense vessel or unexpected calcification.

Skull: Normal. Negative for fracture or focal lesion.

Sinuses/Orbits: No acute finding.

Other: None.
IMPRESSION: No acute intracranial abnormality seen.
